# Patient Record
Sex: Female | Born: 1945 | Race: White | Hispanic: No | Marital: Married | Smoking: Former smoker
Health system: Southern US, Community
[De-identification: ages and names within clinical notes are randomized; demographics above are authoritative.]

## PROBLEM LIST (undated history)

## (undated) DIAGNOSIS — E119 Type 2 diabetes mellitus without complications: Secondary | ICD-10-CM

## (undated) DIAGNOSIS — D126 Benign neoplasm of colon, unspecified: Secondary | ICD-10-CM

## (undated) DIAGNOSIS — K5792 Diverticulitis of intestine, part unspecified, without perforation or abscess without bleeding: Secondary | ICD-10-CM

## (undated) DIAGNOSIS — J309 Allergic rhinitis, unspecified: Secondary | ICD-10-CM

## (undated) DIAGNOSIS — F5102 Adjustment insomnia: Secondary | ICD-10-CM

## (undated) DIAGNOSIS — E785 Hyperlipidemia, unspecified: Secondary | ICD-10-CM

## (undated) DIAGNOSIS — K219 Gastro-esophageal reflux disease without esophagitis: Secondary | ICD-10-CM

## (undated) DIAGNOSIS — I10 Essential (primary) hypertension: Secondary | ICD-10-CM

## (undated) DIAGNOSIS — M810 Age-related osteoporosis without current pathological fracture: Secondary | ICD-10-CM

## (undated) DIAGNOSIS — E039 Hypothyroidism, unspecified: Secondary | ICD-10-CM

## (undated) DIAGNOSIS — G4730 Sleep apnea, unspecified: Secondary | ICD-10-CM

## (undated) DIAGNOSIS — I700 Atherosclerosis of aorta: Secondary | ICD-10-CM

## (undated) DIAGNOSIS — T7840XA Allergy, unspecified, initial encounter: Secondary | ICD-10-CM

## (undated) DIAGNOSIS — E669 Obesity, unspecified: Secondary | ICD-10-CM

## (undated) HISTORY — DX: Sleep apnea, unspecified: G47.30

## (undated) HISTORY — PX: APPENDECTOMY: SHX54

## (undated) HISTORY — PX: TONSILLECTOMY: SUR1361

## (undated) HISTORY — PX: POLYPECTOMY: SHX149

## (undated) HISTORY — DX: Allergy, unspecified, initial encounter: T78.40XA

## (undated) HISTORY — DX: Age-related osteoporosis without current pathological fracture: M81.0

## (undated) HISTORY — DX: Adjustment insomnia: F51.02

## (undated) HISTORY — DX: Diverticulitis of intestine, part unspecified, without perforation or abscess without bleeding: K57.92

## (undated) HISTORY — DX: Obesity, unspecified: E66.9

## (undated) HISTORY — DX: Allergic rhinitis, unspecified: J30.9

## (undated) HISTORY — DX: Type 2 diabetes mellitus without complications: E11.9

## (undated) HISTORY — DX: Atherosclerosis of aorta: I70.0

## (undated) HISTORY — PX: COLONOSCOPY: SHX174

## (undated) HISTORY — PX: CHOLECYSTECTOMY: SHX55

## (undated) HISTORY — DX: Benign neoplasm of colon, unspecified: D12.6

## (undated) HISTORY — PX: ROBOTIC ASSISTED SALPINGO OOPHERECTOMY: SHX6082

## (undated) HISTORY — DX: Essential (primary) hypertension: I10

## (undated) HISTORY — PX: BUNIONECTOMY: SHX129

## (undated) HISTORY — DX: Hypothyroidism, unspecified: E03.9

## (undated) HISTORY — PX: PARTIAL HYSTERECTOMY: SHX80

## (undated) HISTORY — DX: Hyperlipidemia, unspecified: E78.5

## (undated) HISTORY — DX: Gastro-esophageal reflux disease without esophagitis: K21.9

---

## 1992-07-06 HISTORY — PX: CARDIAC CATHETERIZATION: SHX172

## 2001-05-03 ENCOUNTER — Other Ambulatory Visit: Admission: RE | Admit: 2001-05-03 | Discharge: 2001-05-03 | Payer: Self-pay | Admitting: Gastroenterology

## 2001-05-03 ENCOUNTER — Encounter (INDEPENDENT_AMBULATORY_CARE_PROVIDER_SITE_OTHER): Payer: Self-pay | Admitting: Specialist

## 2003-02-24 ENCOUNTER — Encounter: Payer: Self-pay | Admitting: Emergency Medicine

## 2003-02-24 ENCOUNTER — Observation Stay (HOSPITAL_COMMUNITY): Admission: EM | Admit: 2003-02-24 | Discharge: 2003-02-25 | Payer: Self-pay | Admitting: Emergency Medicine

## 2004-05-07 ENCOUNTER — Ambulatory Visit: Payer: Self-pay | Admitting: Gastroenterology

## 2004-09-03 ENCOUNTER — Ambulatory Visit: Payer: Self-pay | Admitting: Internal Medicine

## 2005-09-29 ENCOUNTER — Ambulatory Visit: Payer: Self-pay | Admitting: Internal Medicine

## 2007-04-27 ENCOUNTER — Ambulatory Visit: Payer: Self-pay | Admitting: Gastroenterology

## 2007-05-13 ENCOUNTER — Ambulatory Visit: Payer: Self-pay | Admitting: Gastroenterology

## 2007-05-13 ENCOUNTER — Encounter: Payer: Self-pay | Admitting: Gastroenterology

## 2007-09-14 ENCOUNTER — Emergency Department (HOSPITAL_COMMUNITY): Admission: EM | Admit: 2007-09-14 | Discharge: 2007-09-14 | Payer: Self-pay | Admitting: Emergency Medicine

## 2007-11-15 ENCOUNTER — Ambulatory Visit: Payer: Self-pay | Admitting: Internal Medicine

## 2008-07-06 ENCOUNTER — Emergency Department (HOSPITAL_COMMUNITY): Admission: EM | Admit: 2008-07-06 | Discharge: 2008-07-06 | Payer: Self-pay | Admitting: Emergency Medicine

## 2009-06-08 ENCOUNTER — Encounter: Admission: RE | Admit: 2009-06-08 | Discharge: 2009-06-08 | Payer: Self-pay | Admitting: Orthopedic Surgery

## 2010-03-20 ENCOUNTER — Encounter (INDEPENDENT_AMBULATORY_CARE_PROVIDER_SITE_OTHER): Payer: Self-pay | Admitting: *Deleted

## 2010-03-24 ENCOUNTER — Encounter (INDEPENDENT_AMBULATORY_CARE_PROVIDER_SITE_OTHER): Payer: Self-pay | Admitting: *Deleted

## 2010-04-21 ENCOUNTER — Ambulatory Visit: Payer: Self-pay

## 2010-05-02 ENCOUNTER — Encounter (INDEPENDENT_AMBULATORY_CARE_PROVIDER_SITE_OTHER): Payer: Self-pay | Admitting: *Deleted

## 2010-05-06 ENCOUNTER — Ambulatory Visit: Payer: Self-pay | Admitting: Gastroenterology

## 2010-05-20 ENCOUNTER — Ambulatory Visit: Payer: Self-pay | Admitting: Gastroenterology

## 2010-05-22 ENCOUNTER — Encounter: Payer: Self-pay | Admitting: Gastroenterology

## 2010-08-05 NOTE — Letter (Signed)
Summary: Pre Visit Letter Revised  [facility]  [address] [place], [place] [address]   Phone: [phone]  Fax: [phone]        03/24/2010 MRN: [id number] Toni [name] [address] [place], [place]  [address]             Procedure Date:  05/20/2010   Welcome to the Gastroenterology Division at [facility].    You are scheduled to see a nurse for your pre-procedure visit on 05/06/2010 at 8:00AM on the 3rd floor at [facility], [address]  We ask that you try to arrive at our office 15 minutes prior to your appointment time to allow for check-in.  Please take a minute to review the attached form.  If you answer "Yes" to one or more of the questions on the first page, we ask that you call the person listed at your earliest opportunity.  If you answer "No" to all of the questions, please complete the rest of the form and bring it to your appointment.    Your nurse visit will consist of discussing your medical and surgical history, your immediate family medical history, and your medications.   If you are unable to list all of your medications on the form, please bring the medication bottles to your appointment and we will list them.  We will need to be aware of both prescribed and over the counter drugs.  We will need to know exact dosage information as well.    Please be prepared to read and sign documents such as consent forms, a financial agreement, and acknowledgement forms.  If necessary, and with your consent, a friend or relative is welcome to sit-in on the nurse visit with you.  Please bring your insurance card so that we may make a copy of it.  If your insurance requires a referral to see a specialist, please bring your referral form from your primary care physician.  No co-pay is required for this nurse visit.     If you cannot keep your appointment, please call [phone] to cancel or reschedule prior to your appointment date.  This  allows [place] the opportunity to schedule an appointment for another patient in need of care.    Thank you for choosing [facility] for your medical needs.  We appreciate the opportunity to care for you.  Please visit [place] at our website  to learn more about our practice.  Sincerely, The Gastroenterology Division

## 2010-08-05 NOTE — Procedures (Signed)
Summary: Colonoscopy  Patient: Toni [name] Note: All result statuses are Final unless otherwise noted.  Tests: (1) Colonoscopy (COL)   COL Colonoscopy           DONE     [facility]     [address]     [place], [place]  [address]           COLONOSCOPY PROCEDURE REPORT           PATIENT:  Toni [name], Toni [name]  MR#:  [id number]     BIRTHDATE:  [birth year], 64 yrs. old  GENDER:  female     ENDOSCOPIST:  Judie Petit T. 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  Digital rectal exam was performed and     revealed no abnormalities.   The LB [id number] endoscope     was introduced through the anus and advanced to the cecum, which     was identified by both the appendix and ileocecal valve, without     limitations.  The quality of the prep was excellent, using     MoviPrep.  The instrument was then slowly withdrawn as the colon     was fully examined.     <<PROCEDUREIMAGES>>     FINDINGS:  Scattered diverticula were found in the proximal     transverse colon.  Mild diverticulosis was found in the sigmoid     colon.  A sessile polyp was found in the mid transverse colon. It     was 4 mm in size. Polyp was snared without cautery. Retrieval was     successful.  This was otherwise a normal examination of the colon.     Retroflexed views in the rectum revealed internal hemorrhoids,     small.  The time to cecum =  3  minutes. The scope was then     withdrawn (time =  8  min) from the patient and the  procedure     completed.     COMPLICATIONS:  None           ENDOSCOPIC IMPRESSION:     1) Diverticula, scattered in the proximal transverse colon     2) Mild diverticulosis in the sigmoid colon     3) 4 mm sessile polyp in the mid transverse colon     4) Internal hemorrhoids     RECOMMENDATIONS:     1) Await pathology results     2) High fiber diet with liberal fluid intake.     3) Repeat Colonoscopy in 3 years.           Venita Lick. Russella Dar, MD, [facility]           CC: Vickii Penna, MD           n.     Rosalie Doctor:  Venita Lick. Stark at 05/20/2010 10:18 AM           Allie Dimmer, [id number]  Note: An exclamation mark (!) indicates a result that was not dispersed into the flowsheet. Document Creation Date: 05/20/2010 10:19 AM _______________________________________________________________________  (1) Order result status: Final Collection or observation date-time: 05/20/2010 10:14 Requested date-time:  Receipt date-time:  Reported date-time:  Referring Physician:   Ordering Physician: Claudette Head [phone]) Specimen Source:  Source: Launa Grill Order Number: [id number] Lab site:   Appended Document: Colonoscopy     Procedures Next Due Date:    Colonoscopy: 05/2013

## 2010-08-05 NOTE — Miscellaneous (Signed)
Summary: [facility] Previsit/prep  Clinical Lists Changes  Medications: Added new medication of MOVIPREP 100 GM  SOLR (PEG-KCL-NACL-NASULF-NA ASC-C) As per prep instructions. - Signed Rx of MOVIPREP 100 GM  SOLR (PEG-KCL-NACL-NASULF-NA ASC-C) As per prep instructions.;  #1 x 0;  Signed;  Entered by: Wyona Almas RN;  Authorized by: Meryl Dare MD [facility];  Method used: [facility]  [place] [place], [place]  [address], Ph: [phone] or [phone], Fax: [phone] Allergies: Added new allergy or adverse reaction of PENICILLIN Added new allergy or adverse reaction of CECLOR Observations: Added new observation of NKA: F (05/06/2010 7:54)    Prescriptions: MOVIPREP 100 GM  SOLR (PEG-KCL-NACL-NASULF-NA ASC-C) As per prep instructions.  #1 x 0   Entered by:   Wyona Almas RN   Authorized by:   Meryl Dare MD [facility]   Signed by:   Wyona Almas RN on 05/06/2010   Method used:   [facility]ronically [facility]        [facility]  [place] (retail)       [address]       [place], [place]  [address]       Ph: [phone] or [phone]       Fax: [phone]   RxID:   [id number]

## 2010-08-05 NOTE — Letter (Signed)
Summary: Colonoscopy Letter  [facility]  [address] [place], [place] [address]   Phone: [phone]  Fax: [phone]      March 20, 2010 MRN: [id number]   Toni [name] [address] [place], [place]  [address]   Dear Ms. Hauck,   According to your medical record, it is time for you to schedule a Colonoscopy. The American Cancer Society recommends this procedure as a method to detect early colon cancer. Patients with a family history of colon cancer, or a personal history of colon polyps or inflammatory bowel disease are at increased risk.  This letter has beeen generated based on the recommendations made at the time of your procedure. If you feel that in your particular situation this may no longer apply, please contact our office.  Please call our office at [phone] to schedule this appointment or to update your records at your earliest convenience.  Thank you for cooperating with [place] to provide you with the very best care possible.   Sincerely,  Judie Petit T. Russella Dar, M.D.  [facility] Gastroenterology Division [phone]

## 2010-08-05 NOTE — Letter (Signed)
Summary: Patient Notice-Hyperplastic Polyps  [facility]  [address] [place], [place] [address]   Phone: [phone]  Fax: [phone]        May 22, 2010 MRN: [id number]    Toni [name] [address] [place], [place]  [address]    Dear [name],  I am pleased to inform you that the colon polyp(s) removed during your recent colonoscopy was (were) found to be hyperplastic. These types of polyps are NOT pre-cancerous.  It is my recommendation that you have a repeat colonoscopy examination in 3 years.  Should you develop new or worsening symptoms of abdominal pain, bowel habit changes or bleeding from the rectum or bowels, please schedule an evaluation with either your primary care physician or with me.  Continue treatment plan as outlined the day of your exam.  Please call us if you are having persistent problems or have questions about your condition that have not been fully answered at this time.  Sincerely,  Meryl Dare MD [facility]  This letter has been electronically signed by your physician.  Appended Document: Patient Notice-Hyperplastic Polyps Letter mailed

## 2010-08-05 NOTE — Letter (Signed)
Summary: Moviprep Instructions  [facility]  [address]   [place], [place] [address]   Phone: [phone]  Fax: [phone]       KARN DERK    [birth year]    MRN: [id number]        Procedure Day Dorna Bloom: Tuesday, 05-20-10     Arrival Time: 9:00 a.m.     Procedure Time: 10:00 a.m.     Location of Procedure:                     x   Endoscopy Center (4th Floor)   PREPARATION FOR COLONOSCOPY WITH MOVIPREP   Starting 5 days prior to your procedure 05-15-10 do not eat nuts, seeds, popcorn, corn, beans, peas,  salads, or any raw vegetables.  Do not take any fiber supplements (e.g. Metamucil, Citrucel, and Benefiber).  THE DAY BEFORE YOUR PROCEDURE         DATE: 05-19-10  DAY: Monday  1.  Drink clear liquids the entire day-NO SOLID FOOD  2.  Do not drink anything colored red or purple.  Avoid juices with pulp.  No orange juice.  3.  Drink at least 64 oz. (8 glasses) of fluid/clear liquids during the day to prevent dehydration and help the prep work efficiently.  CLEAR LIQUIDS INCLUDE: Water Jello Ice Popsicles Tea (sugar ok, no milk/cream) Powdered fruit flavored drinks Coffee (sugar ok, no milk/cream) Gatorade Juice: apple, white grape, white cranberry  Lemonade Clear bullion, consomm, broth Carbonated beverages (any kind) Strained chicken noodle soup Hard Candy                             4.  In the morning, mix first dose of MoviPrep solution:    Empty 1 Pouch A and 1 Pouch B into the disposable container    Add lukewarm drinking water to the top line of the container. Mix to dissolve    Refrigerate (mixed solution should be used within 24 hrs)  5.  Begin drinking the prep at 5:00 p.m. The MoviPrep container is divided by 4 marks.   Every 15 minutes drink the solution down to the next mark (approximately 8 oz) until the full liter is complete.   6.  Follow completed prep with 16 oz of clear liquid of your choice (Nothing red or  purple).  Continue to drink clear liquids until bedtime.  7.  Before going to bed, mix second dose of MoviPrep solution:    Empty 1 Pouch A and 1 Pouch B into the disposable container    Add lukewarm drinking water to the top line of the container. Mix to dissolve    Refrigerate  THE DAY OF YOUR PROCEDURE      DATE: 05-20-10  DAY: Tuesday  Beginning at 5:00 a.m. (5 hours before procedure):         1. Every 15 minutes, drink the solution down to the next mark (approx 8 oz) until the full liter is complete.  2. Follow completed prep with 16 oz. of clear liquid of your choice.    3. You may drink clear liquids until  8:00 a.m. (2 HOURS BEFORE PROCEDURE).   MEDICATION INSTRUCTIONS  Unless otherwise instructed, you should take regular prescription medications with a small sip of water   as early as possible the morning of your procedure.  Additional medication instructions:  Hold Diovan/HCTZ the morning of procedure.  OTHER INSTRUCTIONS  You will need a responsible adult at least 63 years of age to accompany you and drive you home.   This person must remain in the waiting room during your procedure.  Wear loose fitting clothing that is easily removed.  Leave jewelry and other valuables at home.  However, you may wish to bring a book to read or  an iPod/MP3 player to listen to music as you wait for your procedure to start.  Remove all body piercing jewelry and leave at home.  Total time from sign-in until discharge is approximately 2-3 hours.  You should go home directly after your procedure and rest.  You can resume normal activities the  day after your procedure.  The day of your procedure you should not:   Drive   Make legal decisions   Operate machinery   Drink alcohol   Return to work  You will receive specific instructions about eating, activities and medications before you leave.    The above instructions have been reviewed and explained to me  by  Wyona Almas RN  May 06, 2010 8:22 AM     I fully understand and can verbalize these instructions _____________________________ Date _________

## 2010-11-21 NOTE — H&P (Signed)
NAME:  DELENN, AHN                        ACCOUNT NO.:  [id number]   MEDICAL RECORD NO.:  [id number]                   PATIENT TYPE:  INP   LOCATION:  1825                                 FACILITY:  [facility]   PHYSICIAN:  Vida Roller, M.D.                DATE OF BIRTH:  09-Jan-1946   DATE OF ADMISSION:  02/24/2003  DATE OF DISCHARGE:                                HISTORY & PHYSICAL   PRIMARY CARE PHYSICIAN:  Dr. Cedric Fishman in [place].   CARDIOLOGIST:  Willa Rough, M.D.   HISTORY OF PRESENT ILLNESS:  The patient is a 65 year old white female with  a past medical history significant for noncardiac chest pain status post  heart catheterization 10 years ago who presents with an episode of  discomfort underneath her left breast.  She was asleep, and it awoke her at  4 o'clock this morning associated with profound diaphoresis with radiation  to her neck.  No shortness of breath.  Mild to moderate palpitations.  She  presented to the emergency department and received some sublingual  nitroglycerin with resolution of the discomfort. Prior to her actually  reporting to the emergency department, she was given four baby aspirins to  chew, and since that time, she has had no discomfort in her chest.  Her  electrocardiogram and initial enzymes appear to be inconsistent with acute  myocardial infarction.  She had an episode of chest discomfort a number of  years ago which was found to be gastroesophageal reflux disease.  She  underwent heart catheterization here at [facility], and was found  to have nonobstructive coronary disease.   PAST MEDICAL HISTORY:  Significant for gastroesophageal reflux disease.  She  takes Prevacid without any difficulty with her discomfort now.  She also has  hypertension which was recently diagnosed by her primary care physician who  started her on Diovan without significant decrement in her blood pressure.  She then had Diovan HCT added with  significant improvement, and she is now  normotensive on medications.  She has hyperlipidemia.  She is treated with  Zetia.  The numbers are not available to me, but she tells me she is  controlled with that medications.  She has had a hysterectomy, gallbladder  surgery with removal, appendectomy, tonsillectomy.  She has orthopedic  problems with her foot.  She has recently had an infection in her foot for  some plantar fasciitis or a stress fracture.  She is not certain.   ALLERGIES:  PENICILLIN.  CECLOR.  She gets a rash.   SOCIAL HISTORY:  She works as a Hydrologist.  She has two children,  both of whom are healthy, a boy and a girl, ages 101 and 86.  She has three  grandchildren who are all healthy.  Her mother is still alive and has  hypertension at age 13.  Her father died a number of years  ago of throat  cancer.  She does not smoke, but has a past smoking history greater than 25  pack years.  She does not drink alcohol.  She does not use drugs.   REVIEW OF SYSTEMS:  Essentially noncontributory except for that mentioned in  the history of present illness.   PHYSICAL EXAMINATION:  GENERAL:  She is a well-developed, well-nourished  white female, in no apparent distress who is alert and oriented x4.  VITAL SIGNS:  Blood pressure 140/70, heart rate 60 and regular, respirations  are 14 and she is afebrile.  HEENT:  Examination of the head, ears, eyes, nose and throat is  unremarkable.  NECK:  Supple.  There is no jugular venous distension or carotid bruits.  CHEST:  Clear to auscultation.  CARDIAC:  Exam with a nondisplaced point of maximum impulse with no lifts or  thrills.  The 1st and 2nd heart sounds are normal.  There is no 3rd or 4th  heart sound.  No murmurs are noted.  ABDOMEN:  Soft, nontender, normoactive bowel sounds.  EXTREMITIES:  Lower extremities are without significant clubbing, cyanosis  or edema.  Her lower extremities have 2+ pulses throughout.    LABORATORY DATA:  Electrocardiogram reveals sinus rhythm with normal  intervals, normal axis, at a rate of 60, with no ST-T wave changes  concerning for ischemia.   Glucose 173, BUN 13, sodium 137, potassium 3.5, chloride 103, bicarbonate of  28.  Her hemoglobin is 16, hematocrit 46.  Her creatinine is 1.0.  Initial  set of cardiac enzymes show CK-MB of 8.0, and myoglobin of 200, troponin of  0.05.   ASSESSMENT:  This is a lady with atypical chest pain who had a  catheterization distantly which was normal who has negative enzymes and a  normal electrocardiogram, who has hypertension which is new onset,  hyperlipidemia.  She is a former tobacco smoker.   PLAN:  1. Cycle her enzymes.  2. Get an exercise Cardiolite in the morning.  3. We will add aspirin to her medication regime.  4. Follow her up at that point.                                                Vida Roller, M.D.    JH/MEDQ  D:  02/24/2003  T:  02/25/2003  Job:  [id number]

## 2010-11-21 NOTE — Discharge Summary (Signed)
   NAME:  Toni [name], Toni [name]                        ACCOUNT NO.:  [id number]   MEDICAL RECORD NO.:  [id number]                   PATIENT TYPE:  INP   LOCATION:  3711                                 FACILITY:  [facility]   PHYSICIAN:  Vida Roller, M.D.                DATE OF BIRTH:  1946/05/12   DATE OF ADMISSION:  02/24/2003  DATE OF DISCHARGE:                           DISCHARGE SUMMARY - REFERRING   PROCEDURE:  Adenosine Cardiolite on February 25, 2003   REASON FOR CONSULTATION:  Please refer to dictated admission note.   LABORATORY DATA:  Cardiac enzymes:  CPK, MB and Troponin markers were  normal.  CBC was normal.  Complete metabolic profile was normal.   Admission chest x-ray:  No acute disease.   HOSPITAL COURSE:  The patient was admitted for further evaluation of chest  discomfort felt to be atypical for ischemic heart disease in the setting of  reportedly normal coronary angiogram, treated hypertension, dyslipidemia and  a history of tobacco.  Serial cardiac enzymes were normal.   The patient was thus referred for stress Cardiolite testing, employing  Adenosine.  No significant ST abnormalities were noted and follow-up  perfusion imaging revealed no perfusion defects; ejection fraction 74%.   The patient was discharged with instructions to resume previous home  medication regimen including Prevacid for treatment of GERD.   Of note, the patient has undergone evaluation by Dr. Claudette Head in the  past and recommendations were to follow-up with him for further evaluation  of GI-related chest pain.   DISCHARGE MEDICATIONS:  1. Aspirin 81 mg daily.  2. Synthroid 0.112 mg daily.  3. Prevacid 30 mg daily.  4. Zetia 10 mg daily.  5. Diovan 80 mg daily.  6. Hydrochlorothiazide 12.5 mg daily.   FOLLOW UP:  1. Follow-up with Dr. Tora Kindred in the following one to two weeks.  2. The patient is also instructed to arrange follow-up with Dr. Claudette Head within the next few weeks  as well.  3. The patient will follow-up with Dr. Willa Rough on an as needed basis.   DISCHARGE DIAGNOSES:  1. Non-cardiac chest pain;     a. Normal serial cardiac markers.     b. Normal Adenosine Cardiolite on August 22.     c. Remote normal coronary angiogram.  2. Hypertension.  3. Dyslipidemia.  4. Gastroesophageal reflux disease.  5. History of tobacco.      Gene Serpe, P.A. [facility]                      Vida Roller, M.D.    GS/MEDQ  D:  02/25/2003  T:  02/25/2003  Job:  [id number]   cc:   Dr. Earley Brooke, [place]   Venita Lick. Russella Dar, M.D. [facility]

## 2011-01-06 ENCOUNTER — Ambulatory Visit: Payer: Self-pay | Admitting: Unknown Physician Specialty

## 2011-09-02 ENCOUNTER — Ambulatory Visit: Payer: Self-pay | Admitting: Anesthesiology

## 2011-09-02 DIAGNOSIS — I10 Essential (primary) hypertension: Secondary | ICD-10-CM

## 2011-09-02 LAB — BASIC METABOLIC PANEL
BUN: 22 mg/dL — ABNORMAL HIGH (ref 7–18)
Chloride: 106 mmol/L (ref 98–107)
EGFR (African American): >60
Osmolality: 293 (ref 275–301)
Potassium: 3.7 mmol/L (ref 3.5–5.1)
Sodium: 145 mmol/L (ref 136–145)

## 2011-09-02 LAB — HEMOGLOBIN: HGB: 14 g/dL (ref 12.0–16.0)

## 2011-09-04 HISTORY — PX: TOTAL THYROIDECTOMY: SHX2547

## 2011-09-10 ENCOUNTER — Ambulatory Visit: Payer: Self-pay | Admitting: Otolaryngology

## 2011-09-11 LAB — CALCIUM: Calcium, Total: 8.9 mg/dL (ref 8.5–10.1)

## 2011-09-14 LAB — PATHOLOGY REPORT

## 2012-04-14 ENCOUNTER — Ambulatory Visit: Payer: Self-pay | Admitting: Unknown Physician Specialty

## 2012-12-04 HISTORY — PX: HAMMER TOE SURGERY: SHX385

## 2012-12-28 ENCOUNTER — Ambulatory Visit: Payer: Self-pay | Admitting: Podiatry

## 2013-02-24 ENCOUNTER — Encounter: Payer: Self-pay | Admitting: Gastroenterology

## 2013-03-15 ENCOUNTER — Encounter: Payer: Self-pay | Admitting: Gastroenterology

## 2013-05-06 DIAGNOSIS — D126 Benign neoplasm of colon, unspecified: Secondary | ICD-10-CM

## 2013-05-06 HISTORY — DX: Benign neoplasm of colon, unspecified: D12.6

## 2013-05-09 ENCOUNTER — Ambulatory Visit (AMBULATORY_SURGERY_CENTER): Payer: Medicare Other

## 2013-05-09 VITALS — Ht 66.0 in | Wt 160.4 lb

## 2013-05-09 DIAGNOSIS — Z86010 Personal history of colonic polyps: Secondary | ICD-10-CM

## 2013-05-09 DIAGNOSIS — Z800 Family history of malignant neoplasm of digestive organs: Secondary | ICD-10-CM

## 2013-05-09 MED ORDER — MOVIPREP 100 G PO SOLR: ORAL | Status: DC

## 2013-05-12 ENCOUNTER — Encounter: Payer: Self-pay | Admitting: Gastroenterology

## 2013-05-24 ENCOUNTER — Encounter: Payer: Self-pay | Admitting: Gastroenterology

## 2013-05-24 ENCOUNTER — Ambulatory Visit (AMBULATORY_SURGERY_CENTER): Payer: Medicare Other | Admitting: Gastroenterology

## 2013-05-24 VITALS — BP 139/71 | HR 50 | Temp 97.9°F | Resp 16 | Ht 66.0 in | Wt 160.0 lb

## 2013-05-24 DIAGNOSIS — D126 Benign neoplasm of colon, unspecified: Secondary | ICD-10-CM

## 2013-05-24 DIAGNOSIS — Z800 Family history of malignant neoplasm of digestive organs: Secondary | ICD-10-CM

## 2013-05-24 DIAGNOSIS — Z86010 Personal history of colonic polyps: Secondary | ICD-10-CM

## 2013-05-24 MED ORDER — SODIUM CHLORIDE 0.9 % IV SOLN: 500.0000 mL | INTRAVENOUS | Status: DC

## 2013-05-24 NOTE — Patient Instructions (Signed)

## 2013-05-24 NOTE — Op Note (Signed)
[facility] [address] [place], [address]   COLONOSCOPY PROCEDURE REPORT PATIENT: Toni [name], Toni [name]  MR#: [id number] BIRTHDATE: [birth year] , 67  yrs. old GENDER: Female ENDOSCOPIST: Meryl Dare, MD, [facility] PROCEDURE DATE:  05/24/2013 PROCEDURE:   Colonoscopy with biopsy and snare polypectomy First Screening Colonoscopy - Avg.  risk and is 50 yrs.  old or older - No.  Prior Negative Screening - Now for repeat screening. N/A  History of Adenoma - Now for follow-up colonoscopy & has been > or = to 3 yrs.  Yes hx of adenoma.  Has been 3 or more years since last colonoscopy.  Polyps Removed Today? Yes. ASA CLASS:   Class II INDICATIONS:Patient's personal history of adenomatous colon polyps and Patient's family history of colon cancer, distant relatives. MEDICATIONS: MAC sedation, administered by CRNA and propofol (Diprivan) 250mg  IV DESCRIPTION OF PROCEDURE:   After the risks benefits and alternatives of the procedure were thoroughly explained, informed consent was obtained.  A digital rectal exam revealed no abnormalities of the rectum.   The LB [id number]  endoscope was introduced through the anus and advanced to the cecum, which was identified by both the appendix and ileocecal valve. No adverse events experienced.   The quality of the prep was good, using MoviPrep  The instrument was then slowly withdrawn as the colon was fully examined.  COLON FINDINGS: Two sessile polyps measuring 3 mm in size were found in the ascending colon.  A polypectomy was performed with cold forceps.  The resection was complete and the polyp tissue was completely retrieved.   Two sessile polyps ranging between 3-27mm in size were found in the transverse colon.  A polypectomy was performed with cold forceps and with a cold snare.  The resection was complete and the polyp tissue was completely retrieved.   A sessile polyp measuring 5 mm in size was found in the sigmoid colon.  A  polypectomy was performed with a cold snare.  The resection was complete and the polyp tissue was completely retrieved.   Mild diverticulosis was noted in the transverse colon and sigmoid colon.   The colon was otherwise normal.  There was no diverticulosis, inflammation, polyps or cancers unless previously stated.  Retroflexed views revealed internal hemorrhoids. The time to cecum=3 minutes 34 seconds.  Withdrawal time=11 minutes 44 seconds.  The scope was withdrawn and the procedure completed. COMPLICATIONS: There were no complications. ENDOSCOPIC IMPRESSION: 1.   Two sessile polyps measuring 3 mm in the ascending colon; polypectomy performed with cold forceps 2.   Two sessile polyps, 3-16mm, in the transverse colon; polypectomy performed with cold forceps and with a cold snare 3.   Sessile polyp measuring 5 mm in the sigmoid colon; polypectomy was performed with a cold snare 4.   Mild diverticulosis was noted in the transverse colon and sigmoid colon 5.   Small internal hemorrhoids RECOMMENDATIONS: 1.  Await pathology results 2.  High fiber diet with liberal fluid intake. 3.  Repeat Colonoscopy in 3 years. eSigned:  Meryl Dare, MD, [facility] 05/24/2013 2:07 PM  cc: Beverely Risen, MD

## 2013-05-24 NOTE — Progress Notes (Signed)
Called to room to assist during endoscopic procedure.  Patient ID and intended procedure confirmed with present staff. Received instructions for my participation in the procedure from the performing physician.  

## 2013-05-24 NOTE — Progress Notes (Signed)
Patient did not experience any of the following events: a burn prior to discharge; a fall within the facility; wrong site/side/patient/procedure/implant event; or a hospital transfer or hospital admission upon discharge from the facility. (G8907) Patient did not have preoperative order for IV antibiotic SSI prophylaxis. (G8918)  

## 2013-05-25 ENCOUNTER — Telehealth: Payer: Self-pay | Admitting: *Deleted

## 2013-05-25 NOTE — Telephone Encounter (Signed)
  Follow up Call-  Call back number 05/24/2013  Post procedure Call Back phone  # [phone]  Permission to leave phone message Yes     Patient questions:  Do you have a fever, pain , or abdominal swelling? no Pain Score  0 *  Have you tolerated food without any problems? yes  Have you been able to return to your normal activities? yes  Do you have any questions about your discharge instructions: Diet   no Medications  no Follow up visit  no  Do you have questions or concerns about your Care? no  Actions: * If pain score is 4 or above: No action needed, pain <4.

## 2013-05-29 ENCOUNTER — Encounter: Payer: Self-pay | Admitting: Gastroenterology

## 2014-03-02 ENCOUNTER — Encounter: Payer: Self-pay | Admitting: Gastroenterology

## 2014-08-03 ENCOUNTER — Ambulatory Visit: Payer: Self-pay

## 2014-09-25 ENCOUNTER — Encounter: Payer: Self-pay | Admitting: Gastroenterology

## 2014-09-26 ENCOUNTER — Ambulatory Visit: Payer: Self-pay | Admitting: Gastroenterology

## 2014-10-14 ENCOUNTER — Emergency Department (HOSPITAL_COMMUNITY)
Admission: EM | Admit: 2014-10-14 | Discharge: 2014-10-14 | Disposition: A | Discharge status: Home / Self Care | Payer: PPO | Attending: Emergency Medicine | Admitting: Emergency Medicine

## 2014-10-14 ENCOUNTER — Encounter (HOSPITAL_COMMUNITY): Payer: Self-pay | Admitting: *Deleted

## 2014-10-14 ENCOUNTER — Emergency Department (HOSPITAL_COMMUNITY): Payer: PPO

## 2014-10-14 DIAGNOSIS — Y9289 Other specified places as the place of occurrence of the external cause: Secondary | ICD-10-CM | POA: Diagnosis not present

## 2014-10-14 DIAGNOSIS — Z7982 Long term (current) use of aspirin: Secondary | ICD-10-CM | POA: Insufficient documentation

## 2014-10-14 DIAGNOSIS — E785 Hyperlipidemia, unspecified: Secondary | ICD-10-CM | POA: Insufficient documentation

## 2014-10-14 DIAGNOSIS — I10 Essential (primary) hypertension: Secondary | ICD-10-CM | POA: Diagnosis not present

## 2014-10-14 DIAGNOSIS — Y998 Other external cause status: Secondary | ICD-10-CM | POA: Diagnosis not present

## 2014-10-14 DIAGNOSIS — S0031XA Abrasion of nose, initial encounter: Secondary | ICD-10-CM | POA: Diagnosis not present

## 2014-10-14 DIAGNOSIS — Y9389 Activity, other specified: Secondary | ICD-10-CM | POA: Diagnosis not present

## 2014-10-14 DIAGNOSIS — Z880 Allergy status to penicillin: Secondary | ICD-10-CM | POA: Insufficient documentation

## 2014-10-14 DIAGNOSIS — W01198A Fall on same level from slipping, tripping and stumbling with subsequent striking against other object, initial encounter: Secondary | ICD-10-CM | POA: Insufficient documentation

## 2014-10-14 DIAGNOSIS — E079 Disorder of thyroid, unspecified: Secondary | ICD-10-CM | POA: Insufficient documentation

## 2014-10-14 DIAGNOSIS — K219 Gastro-esophageal reflux disease without esophagitis: Secondary | ICD-10-CM | POA: Diagnosis not present

## 2014-10-14 DIAGNOSIS — S60222A Contusion of left hand, initial encounter: Secondary | ICD-10-CM | POA: Diagnosis not present

## 2014-10-14 DIAGNOSIS — Z79899 Other long term (current) drug therapy: Secondary | ICD-10-CM | POA: Diagnosis not present

## 2014-10-14 DIAGNOSIS — S0990XA Unspecified injury of head, initial encounter: Secondary | ICD-10-CM | POA: Diagnosis present

## 2014-10-14 DIAGNOSIS — W19XXXA Unspecified fall, initial encounter: Secondary | ICD-10-CM

## 2014-10-14 DIAGNOSIS — T148XXA Other injury of unspecified body region, initial encounter: Secondary | ICD-10-CM

## 2014-10-14 LAB — BASIC METABOLIC PANEL
Anion gap: 9 (ref 5–15)
BUN: 13 mg/dL (ref 6–23)
CALCIUM: 9 mg/dL (ref 8.4–10.5)
CO2: 26 mmol/L (ref 19–32)
CREATININE: 1.08 mg/dL (ref 0.50–1.10)
Chloride: 104 mmol/L (ref 96–112)
GFR calc Af Amer: 61 mL/min — ABNORMAL LOW (ref >90)
GFR calc non Af Amer: 52 mL/min — ABNORMAL LOW (ref >90)
Glucose, Bld: 158 mg/dL — ABNORMAL HIGH (ref 70–99)
Potassium: 3.6 mmol/L (ref 3.5–5.1)
Sodium: 139 mmol/L (ref 135–145)

## 2014-10-14 LAB — CBC
HEMATOCRIT: 31.6 % — AB (ref 36.0–46.0)
HEMOGLOBIN: 9.9 g/dL — AB (ref 12.0–15.0)
MCH: 25.1 pg — AB (ref 26.0–34.0)
MCHC: 31.3 g/dL (ref 30.0–36.0)
MCV: 80 fL (ref 78.0–100.0)
Platelets: 197 10*3/uL (ref 150–400)
RBC: 3.95 MIL/uL (ref 3.87–5.11)
RDW: 13.6 % (ref 11.5–15.5)
WBC: 5.4 10*3/uL (ref 4.0–10.5)

## 2014-10-14 LAB — I-STAT TROPONIN, ED: Troponin i, poc: 0 ng/mL (ref 0.00–0.08)

## 2014-10-14 MED ORDER — ACETAMINOPHEN 325 MG PO TABS
650.0000 mg | ORAL_TABLET | Freq: Once | ORAL | Status: AC
  Administered 2014-10-14: 650 mg via ORAL
  Filled 2014-10-14: qty 2

## 2014-10-14 MED ORDER — OXYCODONE-ACETAMINOPHEN 5-325 MG PO TABS: 1.0000 | ORAL_TABLET | Freq: Four times a day (QID) | ORAL | Status: DC | PRN

## 2014-10-14 MED ORDER — BACITRACIN ZINC 500 UNIT/GM EX OINT
1.0000 "application " | TOPICAL_OINTMENT | CUTANEOUS | Status: AC
  Administered 2014-10-14: 1 via TOPICAL

## 2014-10-14 MED ORDER — SODIUM CHLORIDE 0.9 % IV BOLUS (SEPSIS)
1000.0000 mL | INTRAVENOUS | Status: AC
  Administered 2014-10-14: 1000 mL via INTRAVENOUS

## 2014-10-14 NOTE — ED Notes (Signed)
Pt reports getting out of car today and felt dizzy, pt fell but denies loc. Hit her head on cement, has small laceration to nose, abrasion to left hand and pain to right hand.

## 2014-10-14 NOTE — ED Provider Notes (Signed)
CSN: [id number]     Arrival date & time 10/14/14  1414 History   First MD Initiated Contact with Patient 10/14/14 1543     Chief Complaint  Patient presents with  . Fall  . Dizziness     (Consider location/radiation/quality/duration/timing/severity/associated sxs/prior Treatment) Patient is a 69 y.o. female presenting with fall and dizziness. The history is provided by the patient.  Fall This is a new problem. The current episode started 1 to 2 hours ago. Episode frequency: once. The problem has been resolved. Associated symptoms include headaches. Pertinent negatives include no chest pain, no abdominal pain and no shortness of breath. The symptoms are aggravated by standing. The symptoms are relieved by rest. She has tried nothing for the symptoms. The treatment provided moderate relief.  Dizziness Quality:  Lightheadedness Severity:  Moderate Onset quality:  Sudden Duration:  3 hours Timing:  Intermittent Progression:  Partially resolved Chronicity:  New Context: standing up   Relieved by: sitting. Worsened by:  Standing up Ineffective treatments:  None tried Associated symptoms: headaches   Associated symptoms: no chest pain, no diarrhea, no nausea, no shortness of breath and no vomiting     Past Medical History  Diagnosis Date  . Hypertension   . Hyperlipidemia   . Thyroid disease   . GERD (gastroesophageal reflux disease)    Past Surgical History  Procedure Laterality Date  . Appendectomy    . Total thyroidectomy  09/2011  . Partial hysterectomy    . Robotic assisted salpingo oopherectomy      right ovary first removed then left ovary removed years later  . Bunionectomy      right foot  . Hammer toe surgery  12/2012    left toe  . Cholecystectomy     Family History  Problem Relation Age of Onset  . Throat cancer Father   . Colon cancer Maternal Aunt   . Colon cancer Maternal Uncle    History  Substance Use Topics  . Smoking status: Never Smoker   .  Smokeless tobacco: Never Used  . Alcohol Use: No   OB History    No data available     Review of Systems  Constitutional: Negative for fever and fatigue.  HENT: Negative for congestion and drooling.   Eyes: Negative for pain.  Respiratory: Negative for cough and shortness of breath.   Cardiovascular: Negative for chest pain.  Gastrointestinal: Negative for nausea, vomiting, abdominal pain and diarrhea.  Genitourinary: Negative for dysuria and hematuria.  Musculoskeletal: Negative for back pain, gait problem and neck pain.  Skin: Negative for color change.  Neurological: Positive for dizziness and headaches.  Hematological: Negative for adenopathy.  Psychiatric/Behavioral: Negative for behavioral problems.  All other systems reviewed and are negative.     Allergies  Cefaclor and Penicillins  Home Medications   Prior to Admission medications   Medication Sig Start Date End Date Taking? Authorizing Provider  aspirin 81 MG tablet Take 81 mg by mouth daily.    Historical Provider, MD  Calcium Carbonate-Vitamin D (CALCIUM + D PO) Take by mouth daily.    Historical Provider, MD  fenofibrate 54 MG tablet Take 54 mg by mouth daily.    Historical Provider, MD  levothyroxine (SYNTHROID, LEVOTHROID) 112 MCG tablet Take 112 mcg by mouth. Take one daily and 1/2 on Sunday    Historical Provider, MD  pantoprazole (PROTONIX) 40 MG tablet Take 40 mg by mouth daily.    Historical Provider, MD  rosuvastatin (CRESTOR) 10 MG tablet  Take 10 mg by mouth. Take 1/2 daily    Historical Provider, MD  valsartan-hydrochlorothiazide (DIOVAN-HCT) 160-12.5 MG per tablet Take 1 tablet by mouth daily.    Historical Provider, MD  Zoledronic Acid (RECLAST IV) Inject into the vein. Take annually- if density test changes    Historical Provider, MD   BP 118/63 mmHg  Pulse 79  Temp(Src) 98.1 F (36.7 C) (Oral)  Resp 18  Ht 5' 6.5" (1.689 m)  Wt 184 lb 12.8 oz (83.825 kg)  BMI 29.38 kg/m2  SpO2 100% Physical  Exam  Constitutional: She is oriented to person, place, and time. She appears well-developed and well-nourished.  HENT:  Head: Normocephalic.  Mouth/Throat: Oropharynx is clear and moist. No oropharyngeal exudate.  Mild abrasion to the nasal bridge.  Eyes: Conjunctivae and EOM are normal. Pupils are equal, round, and reactive to light.  Neck: Normal range of motion. Neck supple.  No focal vertebral tenderness.  Cardiovascular: Normal rate, regular rhythm, normal heart sounds and intact distal pulses.  Exam reveals no gallop and no friction rub.   No murmur heard. Pulmonary/Chest: Effort normal and breath sounds normal. No respiratory distress. She has no wheezes.  Abdominal: Soft. Bowel sounds are normal. There is no tenderness. There is no rebound and no guarding.  Musculoskeletal: Normal range of motion. She exhibits no edema or tenderness.  Symmetric lower extremities without focal tenderness.  Mild abrasion to the base of the palm on the left hand.  Mild bruising and swelling with mild tenderness to the lateral ulnar aspect of the right hand.  Neurological: She is alert and oriented to person, place, and time.  alert, oriented x3 speech: normal in context and clarity memory: intact grossly cranial nerves II-XII: intact motor strength: full proximally and distally no involuntary movements or tremors sensation: intact to light touch diffusely  cerebellar: finger-to-nose and heel-to-shin intact gait: normal forwards and backwards, mild lightheadedness w/ standing  Skin: Skin is warm and dry.  Psychiatric: She has a normal mood and affect. Her behavior is normal.  Nursing note and vitals reviewed.   ED Course  Procedures (including critical care time) Labs Review Labs Reviewed  CBC - Abnormal; Notable for the following:    Hemoglobin 9.9 (*)    HCT 31.6 (*)    MCH 25.1 (*)    All other components within normal limits  BASIC METABOLIC PANEL - Abnormal; Notable for the  following:    Glucose, Bld 158 (*)    GFR calc non Af Amer 52 (*)    GFR calc Af Amer 61 (*)    All other components within normal limits  Randolm Idol, ED    Imaging Review Dg Chest 2 View  10/14/2014   CLINICAL DATA:  FALL DIZZINESS  EXAM: CHEST  2 VIEW  COMPARISON:  None.  FINDINGS: The heart size and mediastinal contours are within normal limits. Both lungs are clear. Mild spondylosis identified within the thoracic spine.  IMPRESSION: No active cardiopulmonary disease.   Electronically Signed   By: Kerby Moors M.D.   On: 10/14/2014 15:46   Dg Hand Complete Right  10/14/2014   CLINICAL DATA:  Hand injury today.  EXAM: RIGHT HAND - COMPLETE 3+ VIEW  COMPARISON:  None.  FINDINGS: There is no evidence of fracture or dislocation. There is no evidence of arthropathy or other focal bone abnormality. Soft tissues are unremarkable.  IMPRESSION: Negative.   Electronically Signed   By: Marybelle Killings M.D.   On: 10/14/2014 15:41  EKG Interpretation   Date/Time:  Sunday October 14 2014 14:31:06 EDT Ventricular Rate:  79 PR Interval:  154 QRS Duration: 84 QT Interval:  390 QTC Calculation: 447 R Axis:   61 Text Interpretation:  Normal sinus rhythm Nonspecific ST abnormality  Otherwise no significant change Confirmed by Roben Schliep  MD, [place] ([id number])  on 10/14/2014 3:52:56 PM      MDM   Final diagnoses:  Fall  Abrasion    4:16 PM 69 y.o. female w hx of HTN, HLP who presents with dizziness and fall which occurred around 1 PM today. She states that she had gone to lunch and was getting out of her previous when she began feeling lightheaded and fell to the ground. She denies loss of consciousness. She did hit her head. She is noted some mild dizziness with standing since that time. She denies any chest pain or shortness of breath. She states that she is otherwise been well. Vital signs unremarkable here. She has a normal neurologic exam with very mild dizziness upon standing.  Will get CT  of head, IV fluids, and Tylenol. Tdap is up-to-date within the last 3 years per the patient. 5/10 frontal HA currently.   Hemoglobin found to be 9.9. Patient notes that she had diverticulitis several weeks ago. She also gave blood last week. She states that she has hemorrhoids and occasionally sees some mild bright red blood in her stool, last seen was about a week ago. I do not have a baseline hemoglobin for her.  7:43 PM: I interpreted/reviewed the labs and/or imaging which were non-contributory.  Pt remains ambulatory. Cont to appear well.  I have discussed the diagnosis/risks/treatment options with the patient and family and believe the pt to be eligible for discharge home to follow-up with her pcp. We also discussed returning to the ED immediately if new or worsening sx occur. We discussed the sx which are most concerning (e.g., worsening dizziness, worsening HA, fever) that necessitate immediate return. Medications administered to the patient during their visit and any new prescriptions provided to the patient are listed below.  Medications given during this visit Medications  sodium chloride 0.9 % bolus 1,000 mL (0 mLs Intravenous Stopped 10/14/14 1804)  acetaminophen (TYLENOL) tablet 650 mg (650 mg Oral Given 10/14/14 1634)  bacitracin ointment 1 application (1 application Topical Given 10/14/14 1920)    New Prescriptions   OXYCODONE-ACETAMINOPHEN (PERCOCET) 5-325 MG PER TABLET    Take 1 tablet by mouth every 6 (six) hours as needed for moderate pain.     Pamella Pert, MD 10/14/14 1943

## 2014-10-28 NOTE — Op Note (Signed)
PATIENT NAME:  Toni [name], Toni [name] MR#:  [id number] DATE OF BIRTH:  02/22/1946  DATE OF PROCEDURE:  09/10/2011  PREOPERATIVE DIAGNOSIS: Calcified thyroid nodules bilaterally.   POSTOPERATIVE DIAGNOSIS: Calcified thyroid nodules bilaterally.  PROCEDURE: Total thyroidectomy.   SURGEON: Janalee Dane, MD   ASSISTANT: Mahlon Gammon, MD    DESCRIPTION OF PROCEDURE: The patient was placed in the supine position on the operating room table. After general endotracheal anesthesia had been induced, the patient was placed on a shoulder roll and neck extended. The horizontal incision was marked, locally anesthetized, prepped and draped in the usual fashion. A 15 blade was used to make an incision through the skin and subplatysmal flaps were elevated superiorly and inferiorly. The strap muscles were reflected laterally. Beginning on the patient's left side, the thyroid nodules were identified. Thyroid lobe was reflected medially and the recurrent laryngeal nerve was identified and preserved using the laryngeal nerve monitor. The inferior parathyroid gland was identified and reflected laterally. The superior vascular pedicle was ligated and the thyroid lobe was reflected medially. Attention was directed to the right side where the recurrent laryngeal nerve was once again identified and the parathyroid gland was identified and reflected laterally. The entire thyroid gland was then delivered to the back table where it was oriented for pathology. The wound was copiously irrigated with saline and the wound was closed over a 7 mm Jackson-Pratt drain. Incidentally, Surgicel was placed on each side at [facility] ligament. The patient was then taken off of the shoulder roll, returned to anesthesia, allowed to emerge from anesthesia in the operating room, and taken to the recovery room in stable condition. There were no complications. Estimated blood loss 10 mL.   ____________________________ J. Nadeen Landau,  MD jmc:drc D: 09/10/2011 19:25:43 ET T: 09/11/2011 07:55:15 ET JOB#: [id number]  cc: Janalee Dane, MD, <Dictator> Nicholos Johns MD ELECTRONICALLY SIGNED 09/14/2011 8:08

## 2014-11-13 ENCOUNTER — Encounter: Payer: Self-pay | Admitting: Gastroenterology

## 2014-11-13 ENCOUNTER — Ambulatory Visit: Payer: Self-pay | Admitting: Gastroenterology

## 2014-11-13 ENCOUNTER — Ambulatory Visit (INDEPENDENT_AMBULATORY_CARE_PROVIDER_SITE_OTHER): Payer: PPO | Admitting: Gastroenterology

## 2014-11-13 VITALS — BP 136/62 | HR 76 | Ht 65.5 in | Wt 183.2 lb

## 2014-11-13 DIAGNOSIS — K5732 Diverticulitis of large intestine without perforation or abscess without bleeding: Secondary | ICD-10-CM | POA: Diagnosis not present

## 2014-11-13 DIAGNOSIS — K589 Irritable bowel syndrome without diarrhea: Secondary | ICD-10-CM

## 2014-11-13 DIAGNOSIS — K760 Fatty (change of) liver, not elsewhere classified: Secondary | ICD-10-CM | POA: Diagnosis not present

## 2014-11-13 DIAGNOSIS — R1012 Left upper quadrant pain: Secondary | ICD-10-CM

## 2014-11-13 MED ORDER — HYOSCYAMINE SULFATE 0.125 MG SL SUBL: 0.1250 mg | SUBLINGUAL_TABLET | SUBLINGUAL | Status: DC | PRN

## 2014-11-13 NOTE — Patient Instructions (Signed)
You may begin taking a probiotic daily, these are over the counter such as; Align or Restora. We have sent the following medications to your pharmacy for you to pick up at your convenience: Levsin Recall colon is scheduled for 05/2016, you will get a letter to set that up. Your follow up appointment with Dr Fuller Plan is 12/31/14 11:15 am. CC:  Clayborn Bigness MD

## 2014-11-13 NOTE — Progress Notes (Signed)
    History of Present Illness: This is a 69 year old female who was referred by Lavera Guise, MD for the evaluation of an abnl abdominal/pelvic CT showing acute sigmoid diverticulitis, constipation, fatty liver. She has 2-3 months of alternating constipation and diarrhea associated with LUQ pain relieved by BM and frequent abdominal bloating. Had LLQ pain and CT as above. Treated with about 2 weeks of Cipro and Flagyl. Last colonoscopy 05/2013 showing sigmoid diverticulosis, and 9 small adenomatous colon polyps.   Review of Systems: Pertinent positive and negative review of systems were noted in the above HPI section. All other review of systems were otherwise negative.  Current Medications, Allergies, Past Medical History, Past Surgical History, Family History and Social History were reviewed in [employer] record.  Physical Exam: General: Well developed, well nourished, no acute distress Head: Normocephalic and atraumatic Eyes:  sclerae anicteric, EOMI Ears: Normal auditory acuity Mouth: No deformity or lesions Neck: Supple, no masses or thyromegaly Lungs: Clear throughout to auscultation Heart: Regular rate and rhythm; no murmurs, rubs or bruits Abdomen: Soft, non tender and non distended. No masses, hepatosplenomegaly or hernias noted. Normal Bowel sounds Rectal: deferred Musculoskeletal: Symmetrical with no gross deformities  Skin: No lesions on visible extremities Pulses:  Normal pulses noted Extremities: No clubbing, cyanosis, edema or deformities noted Neurological: Alert oriented x 4, grossly nonfocal Cervical Nodes:  No significant cervical adenopathy Inguinal Nodes: No significant inguinal adenopathy Psychological:  Alert and cooperative. Normal mood and affect  Assessment and Recommendations:  1. Resolving acute diverticulitis. High fiber diet and adequate daily water intake long term.   2. 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 Trial of Align and if not helpful then Restora. Levsin 1-2 qh4. REV in 6 weeks.   3. Fatty liver. Low fat, weight loss diet supervised by PCP.   4. Personal history of adenomatous colon polyps and family history colon cancer. Surveillance colonoscopy 05/2016.   cc: Lavera Guise, MD [address] [place], [place] [address]

## 2014-12-12 ENCOUNTER — Encounter: Payer: Self-pay | Admitting: *Deleted

## 2014-12-13 ENCOUNTER — Ambulatory Visit (INDEPENDENT_AMBULATORY_CARE_PROVIDER_SITE_OTHER): Payer: PPO | Admitting: Cardiovascular Disease

## 2014-12-13 ENCOUNTER — Encounter: Payer: Self-pay | Admitting: Cardiovascular Disease

## 2014-12-13 VITALS — BP 140/80 | HR 76 | Ht 65.5 in | Wt 180.5 lb

## 2014-12-13 DIAGNOSIS — R0602 Shortness of breath: Secondary | ICD-10-CM

## 2014-12-13 DIAGNOSIS — I10 Essential (primary) hypertension: Secondary | ICD-10-CM | POA: Insufficient documentation

## 2014-12-13 DIAGNOSIS — R42 Dizziness and giddiness: Secondary | ICD-10-CM | POA: Diagnosis not present

## 2014-12-13 DIAGNOSIS — R0789 Other chest pain: Secondary | ICD-10-CM | POA: Diagnosis not present

## 2014-12-13 DIAGNOSIS — I700 Atherosclerosis of aorta: Secondary | ICD-10-CM

## 2014-12-13 DIAGNOSIS — E785 Hyperlipidemia, unspecified: Secondary | ICD-10-CM | POA: Insufficient documentation

## 2014-12-13 DIAGNOSIS — E782 Mixed hyperlipidemia: Secondary | ICD-10-CM | POA: Insufficient documentation

## 2014-12-13 NOTE — Patient Instructions (Addendum)
Medication Instructions:  Your physician has recommended you make the following change in your medication:  STOP taking Pletal   Labwork: none  Testing/Procedures: Your physician has requested that you have a lexiscan myoview.  [place]  Your caregiver has ordered a Stress Test with nuclear imaging. The purpose of this test is to evaluate the blood supply to your heart muscle. This procedure is referred to as a "Non-Invasive Stress Test." This is because other than having an IV started in your vein, nothing is inserted or "invades" your body. Cardiac stress tests are done to find areas of poor blood flow to the heart by determining the extent of coronary artery disease (CAD). Some patients exercise on a treadmill, which naturally increases the blood flow to your heart, while others who are  unable to walk on a treadmill due to physical limitations have a pharmacologic/chemical stress agent called Lexiscan . This medicine will mimic walking on a treadmill by temporarily increasing your coronary blood flow.   Please note: these test may take anywhere between 2-4 hours to complete  PLEASE REPORT TO [place] TO GO  Date of Procedure: Wednesday, June 15, 7:30am  Arrival Time for Procedure: 7:00am Instructions regarding medication: You may take your morning medications with a sip of water.      PLEASE NOTIFY THE OFFICE AT LEAST 51 HOURS IN ADVANCE IF YOU ARE UNABLE TO KEEP YOUR APPOINTMENT.  [phone] AND  PLEASE NOTIFY NUCLEAR MEDICINE AT [facility] AT LEAST 24 HOURS IN ADVANCE IF YOU ARE UNABLE TO KEEP YOUR APPOINTMENT. [phone]  How to prepare for your Myoview test:  1. Do not eat or drink after midnight 2. No caffeine for 24 hours prior to test 3. No smoking 24 hours prior to test. 4. Your medication may be taken with water.  If your doctor stopped a medication because of this test, do not take that  medication. 5. Ladies, please do not wear dresses.  Skirts or pants are appropriate. Please wear a short sleeve shirt. 6. No perfume, cologne or lotion. 7. Wear comfortable walking shoes. No heels!             Follow-Up: Your physician recommends that you schedule a follow-up appointment with Dr. Fletcher Anon as needed.    Any Other Special Instructions Will Be Listed Below (If Applicable).

## 2014-12-13 NOTE — Assessment & Plan Note (Signed)
The patient symptoms and cramps are not consistent with claudication. She is able to walk for long distance without reproducible pain. She does have evidence of atherosclerosis. However, by physical exam, she has normal femoral and distal pulses and recent ABI was normal. Thus, I do not recommend an angiogram. I recommend aggressive treatment of risk factors as is being done. I discontinued cilostazol especially with her recent anemia.

## 2014-12-13 NOTE — Assessment & Plan Note (Signed)
Blood pressure is reasonably controlled on current medications. 

## 2014-12-13 NOTE — Progress Notes (Signed)
Primary care physician: Dr. Clayborn Bigness  HPI  This is a pleasant 69 year old female who was referred for evaluation of atypical chest pain. She has no previous cardiac history. She has known history of hypertension, hyperlipidemia and diet-controlled diabetes. The patient was diagnosed with anemia in April and was seen by gastroenterology with subsequent improvement. She had CT scan of the abdomen which showed atherosclerosis of the aorta. Carotid Doppler showed mild intimal thickening with calcified plaque bilaterally. ABI was normal bilaterally. Echocardiogram showed normal LV systolic function with no significant valvular abnormalities. Previous CT scan of the abdomen in January showed severe aortoiliofemoral atherosclerosis without evidence of aortic aneurysm. The patient complains of left upper quadrant pain which is described as aching sensation and cramping which is not related to activities. She does complain of exertional dyspnea. She describes cramping in both legs which she thinks is related to statin treatment. She was placed on Pletal with no significant improvement in symptoms. She was seen by Dr. dew.  Allergies  Allergen Reactions  . Cefaclor     REACTION: swelling/hives  . Penicillins     REACTION: swelling/hives     Current Outpatient Prescriptions on File Prior to Visit  Medication Sig Dispense Refill  . aspirin 81 MG tablet Take 81 mg by mouth daily.    . Calcium Carbonate-Vitamin D (CALCIUM + D PO) Take 1 tablet by mouth daily.     . cetirizine (ZYRTEC) 10 MG tablet Take 10 mg by mouth daily.    . fenofibrate 54 MG tablet Take 54 mg by mouth daily.    . hyoscyamine (LEVSIN SL) 0.125 MG SL tablet Place 1-2 tablets (0.125-0.25 mg total) under the tongue every 4 (four) hours as needed. 30 tablet 6  . ibuprofen (ADVIL,MOTRIN) 200 MG tablet Take 400 mg by mouth every 6 (six) hours as needed.    Marland Kitchen levothyroxine (SYNTHROID, LEVOTHROID) 112 MCG tablet Take 112 mcg by mouth. Take  one daily and 1/2 on Sunday    . pantoprazole (PROTONIX) 40 MG tablet Take 40 mg by mouth daily.    . valsartan (DIOVAN) 160 MG tablet Take 160 mg by mouth daily.     No current facility-administered medications on file prior to visit.     Past Medical History  Diagnosis Date  . Hypertension   . Hypothyroidism   . GERD (gastroesophageal reflux disease)   . Tubular adenoma of colon 05/2013  . Osteoporosis   . Anxiety   . Transient insomnia   . Allergic rhinitis   . Diabetes mellitus, type 2   . Obesity   . Diverticulitis   . Aortic atherosclerosis   . Hyperlipidemia   . Essential hypertension      Past Surgical History  Procedure Laterality Date  . Appendectomy    . Total thyroidectomy  09/2011  . Partial hysterectomy    . Robotic assisted salpingo oopherectomy      right ovary first removed then left ovary removed years later  . Bunionectomy      right foot  . Hammer toe surgery  12/2012    left toe  . Cholecystectomy    . Cardiac catheterization  [facility]      Family History  Problem Relation Age of Onset  . Throat cancer Father   . Colon cancer Maternal Aunt   . Colon cancer Maternal Uncle   . Colon polyps Maternal Aunt   . Colon polyps Maternal Uncle   . Diabetes Neg Hx   .  Kidney disease Neg Hx   . Esophageal cancer Neg Hx   . Gallbladder disease Neg Hx   . Heart disease Maternal Uncle      History   Social History  . Marital Status: Married    Spouse Name: N/A  . Number of Children: 2  . Years of Education: N/A   Occupational History  . Retired    Social History Main Topics  . Smoking status: Former Smoker -- 1.00 packs/day for 30 years    Types: Cigarettes    Quit date: 07/06/1998  . Smokeless tobacco: Never Used  . Alcohol Use: No  . Drug Use: No  . Sexual Activity: Not on file   Other Topics Concern  . Not on file   Social History Narrative     ROS A 10 point review of system was performed. It is negative  other than that mentioned in the history of present illness.   PHYSICAL EXAM   BP 140/80 mmHg  Pulse 76  Ht 5' 5.5" (1.664 m)  Wt 180 lb 8 oz (81.874 kg)  BMI 29.57 kg/m2 Constitutional: She is oriented to person, place, and time. She appears well-developed and well-nourished. No distress.  HENT: No nasal discharge.  Head: Normocephalic and atraumatic.  Eyes: Pupils are equal and round. No discharge.  Neck: Normal range of motion. Neck supple. No JVD present. No thyromegaly present.  Cardiovascular: Normal rate, regular rhythm, normal heart sounds. Exam reveals no gallop and no friction rub. No murmur heard.  Pulmonary/Chest: Effort normal and breath sounds normal. No stridor. No respiratory distress. She has no wheezes. She has no rales. She exhibits no tenderness.  Abdominal: Soft. Bowel sounds are normal. She exhibits no distension. There is no tenderness. There is no rebound and no guarding.  Musculoskeletal: Normal range of motion. She exhibits no edema and no tenderness.  Neurological: She is alert and oriented to person, place, and time. Coordination normal.  Skin: Skin is warm and dry. No rash noted. She is not diaphoretic. No erythema. No pallor.  Psychiatric: She has a normal mood and affect. Her behavior is normal. Judgment and thought content normal.  Vascular: Femoral pulses are normal. Dorsalis pedis: Normal bilaterally. Posterior tibial: Normal bilaterally.   ZOX:WRUEA  Rhythm  WITHIN NORMAL LIMITS   ASSESSMENT AND PLAN

## 2014-12-13 NOTE — Assessment & Plan Note (Signed)
Her chest pain is overall atypical but she does have exertional dyspnea and multiple risk factors for coronary artery disease. Thus, I recommend evaluation with a pharmacologic nuclear stress test. She is not able to exercise on a treadmill due to injury to the right foot.

## 2014-12-19 ENCOUNTER — Encounter
Admission: RE | Admit: 2014-12-19 | Discharge: 2014-12-19 | Disposition: A | Discharge status: Home / Self Care | Payer: PPO | Source: Ambulatory Visit | Attending: Cardiovascular Disease | Admitting: Cardiovascular Disease

## 2014-12-19 DIAGNOSIS — R0789 Other chest pain: Secondary | ICD-10-CM | POA: Diagnosis not present

## 2014-12-19 LAB — NM MYOCAR MULTI W/SPECT W/WALL MOTION / EF
CHL CUP NUCLEAR SRS: 0
CSEPPHR: 109 {beats}/min
LV sys vol: 15 mL
LVDIAVOL: 48 mL
Percent HR: 70 %
Rest HR: 64 {beats}/min
SDS: 0
SSS: 1
TID: 0.91

## 2014-12-19 MED ORDER — REGADENOSON 0.4 MG/5ML IV SOLN
0.4000 mg | Freq: Once | INTRAVENOUS | Status: AC
  Administered 2014-12-19: 0.4 mg via INTRAVENOUS
  Filled 2014-12-19: qty 5

## 2014-12-19 MED ORDER — TECHNETIUM TC 99M SESTAMIBI - CARDIOLITE
33.0000 | Freq: Once | INTRAVENOUS | Status: AC | PRN
  Administered 2014-12-19: 09:00:00 31.693 via INTRAVENOUS

## 2014-12-19 MED ORDER — TECHNETIUM TC 99M SESTAMIBI - CARDIOLITE
13.0000 | Freq: Once | INTRAVENOUS | Status: AC | PRN
  Administered 2014-12-19: 14.095 via INTRAVENOUS

## 2014-12-31 ENCOUNTER — Ambulatory Visit: Payer: PPO | Admitting: Gastroenterology

## 2015-01-14 ENCOUNTER — Ambulatory Visit: Payer: PPO | Admitting: Gastroenterology

## 2015-02-20 ENCOUNTER — Ambulatory Visit (INDEPENDENT_AMBULATORY_CARE_PROVIDER_SITE_OTHER): Payer: PPO | Admitting: Gastroenterology

## 2015-02-20 ENCOUNTER — Encounter: Payer: Self-pay | Admitting: Gastroenterology

## 2015-02-20 VITALS — BP 122/74 | HR 76 | Ht 65.5 in | Wt 183.2 lb

## 2015-02-20 DIAGNOSIS — D649 Anemia, unspecified: Secondary | ICD-10-CM

## 2015-02-20 DIAGNOSIS — Z86010 Personal history of colonic polyps: Secondary | ICD-10-CM

## 2015-02-20 DIAGNOSIS — K589 Irritable bowel syndrome without diarrhea: Secondary | ICD-10-CM

## 2015-02-20 DIAGNOSIS — Z860101 Personal history of adenomatous and serrated colon polyps: Secondary | ICD-10-CM

## 2015-02-20 NOTE — Patient Instructions (Signed)
Follow the instructions on the Hemoccult cards and mail them back to us when you are finished or you may take them directly to the lab in the basement of the [facility] building. We will call you with the results.   Thank you for choosing me and [facility].  Malcolm T. Stark, Jr., MD., FACG   

## 2015-02-20 NOTE — Progress Notes (Signed)
    History of Present Illness: This is a 69 year old female turning for follow-up of IBS. Her abdominal pain has improved substantially. She notes some mild upper abdominal discomfort since being placed on Cipro. She's been followed for a microcytic anemia by her primary care physician. She was placed on iron supplements. Blood work from 10/14/2014 shows hemoglobin=9.9 MCV=80. Blood work from 11/15/2014 shows hemoglobin=10.7 and MCV=74. Iron studies, B12 and folate were normal. Hemoglobin=15.4 from her PCPs office last week. She has a history of frequent blood donation over the years. She underwent colonoscopy in November 2014 that showed several small adenomatous colon polyps, mild diverticulosis and small internal hemorrhoids. No hematochezia or melena.   Current Medications, Allergies, Past Medical History, Past Surgical History, Family History and Social History were reviewed in [employer] record.  Physical Exam: General: Well developed , well nourished, no acute distress Head: Normocephalic and atraumatic Eyes:  sclerae anicteric, EOMI Ears: Normal auditory acuity Mouth: No deformity or lesions Lungs: Clear throughout to auscultation Heart: Regular rate and rhythm; no murmurs, rubs or bruits Abdomen: Soft, non tender and non distended. No masses, hepatosplenomegaly or hernias noted. Normal Bowel sounds Musculoskeletal: Symmetrical with no gross deformities  Pulses:  Normal pulses noted Extremities: No clubbing, cyanosis, edema or deformities noted Neurological: Alert oriented x 4, grossly nonfocal Psychological:  Alert and cooperative. Normal mood and affect  Assessment and Recommendations:  1. Microcytic anemia with normal iron studies, B12 and folate. On Fe per PCP and Hb has normalized. Check stool Hemoccults. Last colonoscopy 04/2013. Hold on any blood donation for now.   2. Presumed IBS with mild constipation, alternating with diarrhea, intermittent  LUQ'epigastric pain and abdominal bloating. No diarrhea recently since Fe was started. Levsin 1-2 qh4 prn.    3. Fatty liver. Low fat, weight loss diet supervised by PCP.   4. Personal history of adenomatous colon polyps and family history colon cancer. Surveillance colonoscopy 05/2016.

## 2015-02-27 ENCOUNTER — Other Ambulatory Visit (INDEPENDENT_AMBULATORY_CARE_PROVIDER_SITE_OTHER): Payer: PPO

## 2015-02-27 DIAGNOSIS — D649 Anemia, unspecified: Secondary | ICD-10-CM | POA: Diagnosis not present

## 2015-02-27 LAB — HEMOCCULT SLIDES (X 3 CARDS)
Fecal Occult Blood: NEGATIVE
OCCULT 1: NEGATIVE
OCCULT 2: NEGATIVE
OCCULT 3: NEGATIVE
OCCULT 4: NEGATIVE
OCCULT 5: NEGATIVE

## 2015-07-09 DIAGNOSIS — R946 Abnormal results of thyroid function studies: Secondary | ICD-10-CM | POA: Diagnosis not present

## 2015-07-12 DIAGNOSIS — J019 Acute sinusitis, unspecified: Secondary | ICD-10-CM | POA: Diagnosis not present

## 2015-07-12 DIAGNOSIS — G4733 Obstructive sleep apnea (adult) (pediatric): Secondary | ICD-10-CM | POA: Diagnosis not present

## 2015-08-05 DIAGNOSIS — G4733 Obstructive sleep apnea (adult) (pediatric): Secondary | ICD-10-CM | POA: Diagnosis not present

## 2015-08-05 DIAGNOSIS — I10 Essential (primary) hypertension: Secondary | ICD-10-CM | POA: Diagnosis not present

## 2015-08-05 DIAGNOSIS — E119 Type 2 diabetes mellitus without complications: Secondary | ICD-10-CM | POA: Diagnosis not present

## 2015-08-05 DIAGNOSIS — R42 Dizziness and giddiness: Secondary | ICD-10-CM | POA: Diagnosis not present

## 2015-08-26 ENCOUNTER — Encounter (HOSPITAL_COMMUNITY): Payer: Self-pay

## 2015-08-26 DIAGNOSIS — Z87891 Personal history of nicotine dependence: Secondary | ICD-10-CM | POA: Diagnosis not present

## 2015-08-26 DIAGNOSIS — E119 Type 2 diabetes mellitus without complications: Secondary | ICD-10-CM | POA: Insufficient documentation

## 2015-08-26 DIAGNOSIS — E669 Obesity, unspecified: Secondary | ICD-10-CM | POA: Diagnosis not present

## 2015-08-26 DIAGNOSIS — K219 Gastro-esophageal reflux disease without esophagitis: Secondary | ICD-10-CM | POA: Insufficient documentation

## 2015-08-26 DIAGNOSIS — R600 Localized edema: Secondary | ICD-10-CM | POA: Diagnosis not present

## 2015-08-26 DIAGNOSIS — Z792 Long term (current) use of antibiotics: Secondary | ICD-10-CM | POA: Insufficient documentation

## 2015-08-26 DIAGNOSIS — E039 Hypothyroidism, unspecified: Secondary | ICD-10-CM | POA: Insufficient documentation

## 2015-08-26 DIAGNOSIS — R609 Edema, unspecified: Secondary | ICD-10-CM | POA: Diagnosis not present

## 2015-08-26 DIAGNOSIS — Z8659 Personal history of other mental and behavioral disorders: Secondary | ICD-10-CM | POA: Insufficient documentation

## 2015-08-26 DIAGNOSIS — Z79899 Other long term (current) drug therapy: Secondary | ICD-10-CM | POA: Diagnosis not present

## 2015-08-26 DIAGNOSIS — M810 Age-related osteoporosis without current pathological fracture: Secondary | ICD-10-CM | POA: Insufficient documentation

## 2015-08-26 DIAGNOSIS — R202 Paresthesia of skin: Secondary | ICD-10-CM | POA: Diagnosis not present

## 2015-08-26 DIAGNOSIS — I10 Essential (primary) hypertension: Secondary | ICD-10-CM | POA: Insufficient documentation

## 2015-08-26 DIAGNOSIS — Z9889 Other specified postprocedural states: Secondary | ICD-10-CM | POA: Insufficient documentation

## 2015-08-26 DIAGNOSIS — Z7982 Long term (current) use of aspirin: Secondary | ICD-10-CM | POA: Insufficient documentation

## 2015-08-26 DIAGNOSIS — Z86018 Personal history of other benign neoplasm: Secondary | ICD-10-CM | POA: Insufficient documentation

## 2015-08-26 DIAGNOSIS — Z880 Allergy status to penicillin: Secondary | ICD-10-CM | POA: Insufficient documentation

## 2015-08-26 DIAGNOSIS — I2510 Atherosclerotic heart disease of native coronary artery without angina pectoris: Secondary | ICD-10-CM | POA: Diagnosis not present

## 2015-08-26 DIAGNOSIS — E785 Hyperlipidemia, unspecified: Secondary | ICD-10-CM | POA: Insufficient documentation

## 2015-08-26 LAB — DIFFERENTIAL
Basophils Absolute: 0.1 10*3/uL (ref 0.0–0.1)
Basophils Relative: 1 %
EOS PCT: 2 %
Eosinophils Absolute: 0.1 10*3/uL (ref 0.0–0.7)
LYMPHS PCT: 36 %
Lymphs Abs: 1.9 10*3/uL (ref 0.7–4.0)
MONO ABS: 0.4 10*3/uL (ref 0.1–1.0)
MONOS PCT: 8 %
NEUTROS ABS: 2.8 10*3/uL (ref 1.7–7.7)
Neutrophils Relative %: 53 %

## 2015-08-26 LAB — COMPREHENSIVE METABOLIC PANEL
ALK PHOS: 49 U/L (ref 38–126)
ALT: 34 U/L (ref 14–54)
ANION GAP: 9 (ref 5–15)
AST: 23 U/L (ref 15–41)
Albumin: 3.8 g/dL (ref 3.5–5.0)
BILIRUBIN TOTAL: 0.7 mg/dL (ref 0.3–1.2)
BUN: 12 mg/dL (ref 6–20)
CALCIUM: 9.5 mg/dL (ref 8.9–10.3)
CO2: 26 mmol/L (ref 22–32)
CREATININE: 0.92 mg/dL (ref 0.44–1.00)
Chloride: 104 mmol/L (ref 101–111)
Glucose, Bld: 114 mg/dL — ABNORMAL HIGH (ref 65–99)
Potassium: 3.7 mmol/L (ref 3.5–5.1)
Sodium: 139 mmol/L (ref 135–145)
TOTAL PROTEIN: 6 g/dL — AB (ref 6.5–8.1)

## 2015-08-26 LAB — I-STAT TROPONIN, ED: TROPONIN I, POC: 0 ng/mL (ref 0.00–0.08)

## 2015-08-26 LAB — I-STAT CHEM 8, ED
BUN: 14 mg/dL (ref 6–20)
CALCIUM ION: 1.18 mmol/L (ref 1.13–1.30)
CHLORIDE: 103 mmol/L (ref 101–111)
Creatinine, Ser: 0.9 mg/dL (ref 0.44–1.00)
GLUCOSE: 111 mg/dL — AB (ref 65–99)
HCT: 41 % (ref 36.0–46.0)
HEMOGLOBIN: 13.9 g/dL (ref 12.0–15.0)
Potassium: 3.6 mmol/L (ref 3.5–5.1)
Sodium: 143 mmol/L (ref 135–145)
TCO2: 28 mmol/L (ref 0–100)

## 2015-08-26 LAB — CBC
HEMATOCRIT: 39.6 % (ref 36.0–46.0)
Hemoglobin: 13.2 g/dL (ref 12.0–15.0)
MCH: 29.5 pg (ref 26.0–34.0)
MCHC: 33.3 g/dL (ref 30.0–36.0)
MCV: 88.4 fL (ref 78.0–100.0)
Platelets: 199 10*3/uL (ref 150–400)
RBC: 4.48 MIL/uL (ref 3.87–5.11)
RDW: 12.6 % (ref 11.5–15.5)
WBC: 5.3 10*3/uL (ref 4.0–10.5)

## 2015-08-26 LAB — PROTIME-INR
INR: 1.05 (ref 0.00–1.49)
PROTHROMBIN TIME: 13.9 s (ref 11.6–15.2)

## 2015-08-26 LAB — APTT: aPTT: 28 seconds (ref 24–37)

## 2015-08-26 NOTE — ED Notes (Addendum)
Pt states her BP has been up today. Was reading in 190/81 and at [facility] it was 201/89 and HR 70. Pt states she has some tingling in her left arm but denies any pain. The tingling started she thinks in the last hour. States she cooked dinner and walked today. Has taken her medicine.

## 2015-08-27 ENCOUNTER — Emergency Department (HOSPITAL_COMMUNITY)
Admission: EM | Admit: 2015-08-27 | Discharge: 2015-08-27 | Disposition: A | Discharge status: Home / Self Care | Payer: PPO | Attending: Emergency Medicine | Admitting: Emergency Medicine

## 2015-08-27 DIAGNOSIS — I10 Essential (primary) hypertension: Secondary | ICD-10-CM

## 2015-08-27 DIAGNOSIS — R609 Edema, unspecified: Secondary | ICD-10-CM

## 2015-08-27 MED ORDER — HYDROCHLOROTHIAZIDE 12.5 MG PO TABS: 12.5000 mg | ORAL_TABLET | Freq: Every day | ORAL | Status: DC

## 2015-08-27 NOTE — ED Provider Notes (Signed)
CSN: [id number]     Arrival date & time 08/26/15  1956 History  By signing my name below, I, [name], attest that this documentation has been prepared under the direction and in the presence of Delora Fuel, MD . Electronically Signed: [name], Scribe. 08/27/2015. 2:32 AM.   Chief Complaint  Patient presents with  . Hypertension  . Tingling   The history is provided by the patient. No language interpreter was used.   HPI Comments:  Toni [name] is a 70 y.o. female with PMHx of HTN, hypothyroidism, DM, and HLD who presents to the Emergency Department complaining of hypertension yesterday, [facility] 201[facility]. Pt reports associated tingling in left arm and mild swelling in BL feet. No alleviating factors noted. She states she doesn't feel bad currently. Pt also notes recent vacation to the beach. Pt is compliant with current blood pressure medications. Pt is a non-smoker and denies ETOH use. She denies chest pain, chest tightness, nose bleeds, shortness of breath, or any other symptoms at this time.   Past Medical History  Diagnosis Date  . Hypertension   . Hypothyroidism   . GERD (gastroesophageal reflux disease)   . Tubular adenoma of colon 05/2013  . Osteoporosis   . Anxiety   . Transient insomnia   . Allergic rhinitis   . Diabetes mellitus, type 2 ([place])   . Obesity   . Diverticulitis   . Aortic atherosclerosis ([place])   . Hyperlipidemia   . Essential hypertension    Past Surgical History  Procedure Laterality Date  . Appendectomy    . Total thyroidectomy  09/2011  . Partial hysterectomy    . Robotic assisted salpingo oopherectomy      right ovary first removed then left ovary removed years later  . Bunionectomy      right foot  . Hammer toe surgery  12/2012    left toe  . Cholecystectomy    . Cardiac catheterization  [facility]    Family History  Problem Relation Age of Onset  . Throat cancer Father   . Colon cancer Maternal Aunt   .  Colon cancer Maternal Uncle   . Colon polyps Maternal Aunt   . Colon polyps Maternal Uncle   . Diabetes Neg Hx   . Kidney disease Neg Hx   . Esophageal cancer Neg Hx   . Gallbladder disease Neg Hx   . Heart disease Maternal Uncle    Social History  Substance Use Topics  . Smoking status: Former Smoker -- 1.00 packs/day for 30 years    Types: Cigarettes    Quit date: 07/06/1998  . Smokeless tobacco: Never Used  . Alcohol Use: No   OB History    No data available     Review of Systems  HENT: Negative for nosebleeds.   Respiratory: Negative for chest tightness and shortness of breath.   Cardiovascular: Positive for leg swelling (feet). Negative for chest pain.       Hypertension  Neurological: Positive for numbness.  All other systems reviewed and are negative.  Allergies  Cefaclor and Penicillins  Home Medications   Prior to Admission medications   Medication Sig Start Date End Date Taking? Authorizing Provider  aspirin 81 MG tablet Take 81 mg by mouth daily.    Historical Provider, MD  Calcium Carbonate-Vitamin D (CALCIUM + D PO) Take 1 tablet by mouth daily.     Historical Provider, MD  cetirizine (ZYRTEC) 10 MG tablet  Take 10 mg by mouth as needed.     Historical Provider, MD  ciprofloxacin (CIPRO) 500 MG tablet Take 1 tablet by mouth 2 (two) times daily. 02/15/15   Historical Provider, MD  FeFum-FePoly-FA-B Cmp-C-Biot (INTEGRA PLUS) CAPS Take 1 tablet by mouth daily. 02/04/15   Historical Provider, MD  fenofibrate 54 MG tablet Take 54 mg by mouth daily.    Historical Provider, MD  hyoscyamine (LEVSIN SL) 0.125 MG SL tablet Place 1-2 tablets (0.125-0.25 mg total) under the tongue every 4 (four) hours as needed. Patient not taking: Reported on 02/20/2015 11/13/14   Ladene Artist, MD  levothyroxine (SYNTHROID, LEVOTHROID) 112 MCG tablet Take 112 mcg by mouth. Take one daily and 1/2 on Sunday    Historical Provider, MD  pantoprazole (PROTONIX) 40 MG tablet Take 40 mg by mouth  daily.    Historical Provider, MD  rosuvastatin (CRESTOR) 20 MG tablet Take 1 tablet by mouth daily. 01/24/15   Historical Provider, MD  valsartan (DIOVAN) 160 MG tablet Take 160 mg by mouth daily.    Historical Provider, MD   BP 169/70 mmHg  Pulse 65  Temp(Src) 98 F (36.7 C) (Oral)  Resp 18  Ht 5\' 6"  (1.676 m)  Wt 190 lb 4.8 oz (86.32 kg)  BMI 30.73 kg/m2  SpO2 99% Physical Exam  Constitutional: She is oriented to person, place, and time. She appears well-developed and well-nourished. No distress.  HENT:  Head: Normocephalic and atraumatic.  Right Ear: Hearing normal.  Left Ear: Hearing normal.  Nose: Nose normal.  Mouth/Throat: Oropharynx is clear and moist and mucous membranes are normal.  Eyes: Conjunctivae and EOM are normal. Pupils are equal, round, and reactive to light.  Neck: Normal range of motion. Neck supple. No JVD present.  Cardiovascular: Regular rhythm, S1 normal and S2 normal.  Exam reveals no gallop and no friction rub.   No murmur heard. Pulmonary/Chest: Effort normal and breath sounds normal. She has no wheezes. She has no rales. She exhibits no tenderness.  Abdominal: Soft. Normal appearance and bowel sounds are normal. She exhibits no distension and no mass. There is no hepatosplenomegaly. There is no tenderness. There is no tenderness at McBurney's point and negative Murphy's sign. No hernia.  Musculoskeletal: Normal range of motion.  1+ pitting edema BL  Lymphadenopathy:    She has no cervical adenopathy.  Neurological: She is alert and oriented to person, place, and time. She has normal strength. No cranial nerve deficit or sensory deficit. Coordination normal. GCS eye subscore is 4. GCS verbal subscore is 5. GCS motor subscore is 6.  Skin: Skin is warm, dry and intact. No rash noted. No cyanosis.  Psychiatric: She has a normal mood and affect. Her speech is normal and behavior is normal. Judgment and thought content normal.  Nursing note and vitals  reviewed.  ED Course  Procedures  DIAGNOSTIC STUDIES: Oxygen Saturation is 99% on RA, normal by my interpretation.    COORDINATION OF CARE: 2:29 AM Will prescribe diuretic prior to discharge. Advised pt on proper blood pressure protocol. Discussed treatment plan with pt at bedside and pt agreed to plan.  Labs Review Labs Reviewed  COMPREHENSIVE METABOLIC PANEL - Abnormal; Notable for the following:    Glucose, Bld 114 (*)    Total Protein 6.0 (*)    All other components within normal limits  I-STAT CHEM 8, ED - Abnormal; Notable for the following:    Glucose, Bld 111 (*)    All other components within  normal limits  PROTIME-INR  APTT  CBC  DIFFERENTIAL  I-STAT TROPOININ, ED   I have personally reviewed and evaluated these lab results as part of my medical decision-making.    EKG Interpretation  Date/Time:  Monday August 26 2015 20:18:05 EST Ventricular Rate:  73 PR Interval:  156 QRS Duration: 86 QT Interval:  410 QTC Calculation: 451 R Axis:   64 Text Interpretation:  Normal sinus rhythm Normal ECG When compared with  ECG of 10/14/2014, No significant change was found Confirmed by [facility]  MD,  Lambros Cerro ([id number]) on 08/27/2015 2:18:42 AM      MDM   Final diagnoses:  Essential hypertension  Peripheral edema   Essential hypertension. Patient has spent several weeks at the beach on vacation and admits to having excessive salt intake during that time. This would account for her peripheral edema and probably loss of control of her blood pressure. Subjective numbness is of uncertain cause but she has normal neurologic exam currently. She is discharged with prescription for hydrochlorothiazide to try to take care of her edema and also assist in blood pressure control. This is likely to only be needed short-term. She has an appointment scheduled with her PCP in several weeks and she is to keep that appointment. Continue monitoring blood pressure at home. Old records are reviewed  and the last several office visits have had normal blood pressures.  I personally performed the services described in this documentation, which was scribed in my presence. The recorded information has been reviewed and is accurate.      Delora Fuel, MD [id number]

## 2015-08-27 NOTE — ED Notes (Signed)
Patuient able todress and ambulate independently

## 2015-08-27 NOTE — Discharge Instructions (Signed)
Continue to monitor your blood pressure every day. Stay on a low salt diet.  Hypertension Hypertension, commonly called high blood pressure, is when the force of blood pumping through your arteries is too strong. Your arteries are the blood vessels that carry blood from your heart throughout your body. A blood pressure reading consists of a higher number over a lower number, such as 110/72. The higher number (systolic) is the pressure inside your arteries when your heart pumps. The lower number (diastolic) is the pressure inside your arteries when your heart relaxes. Ideally you want your blood pressure below 120/80. Hypertension forces your heart to work harder to pump blood. Your arteries may become narrow or stiff. Having untreated or uncontrolled hypertension can cause heart attack, stroke, kidney disease, and other problems. RISK FACTORS Some risk factors for high blood pressure are controllable. Others are not.  Risk factors you cannot control include:   Race. You may be at higher risk if you are African American.  Age. Risk increases with age.  Gender. Men are at higher risk than women before age 87 years. After age 42, women are at higher risk than men. Risk factors you can control include:  Not getting enough exercise or physical activity.  Being overweight.  Getting too much fat, sugar, calories, or salt in your diet.  Drinking too much alcohol. SIGNS AND SYMPTOMS Hypertension does not usually cause signs or symptoms. Extremely high blood pressure (hypertensive crisis) may cause headache, anxiety, shortness of breath, and nosebleed. DIAGNOSIS To check if you have hypertension, your health care provider will measure your blood pressure while you are seated, with your arm held at the level of your heart. It should be measured at least twice using the same arm. Certain conditions can cause a difference in blood pressure between your right and left arms. A blood pressure reading that  is higher than normal on one occasion does not mean that you need treatment. If it is not clear whether you have high blood pressure, you may be asked to return on a different day to have your blood pressure checked again. Or, you may be asked to monitor your blood pressure at home for 1 or more weeks. TREATMENT Treating high blood pressure includes making lifestyle changes and possibly taking medicine. Living a healthy lifestyle can help lower high blood pressure. You may need to change some of your habits. Lifestyle changes may include:  Following the DASH diet. This diet is high in fruits, vegetables, and whole grains. It is low in salt, red meat, and added sugars.  Keep your sodium intake below 2,300 mg per day.  Getting at least 30-45 minutes of aerobic exercise at least 4 times per week.  Losing weight if necessary.  Not smoking.  Limiting alcoholic beverages.  Learning ways to reduce stress. Your health care provider may prescribe medicine if lifestyle changes are not enough to get your blood pressure under control, and if one of the following is true:  You are 108-77 years of age and your systolic blood pressure is above 140.  You are 33 years of age or older, and your systolic blood pressure is above 150.  Your diastolic blood pressure is above 90.  You have diabetes, and your systolic blood pressure is over [id number] or your diastolic blood pressure is over 90.  You have kidney disease and your blood pressure is above 140/90.  You have heart disease and your blood pressure is above 140/90. Your personal target blood pressure  may vary depending on your medical conditions, your age, and other factors. HOME CARE INSTRUCTIONS  Have your blood pressure rechecked as directed by your health care provider.   Take medicines only as directed by your health care provider. Follow the directions carefully. Blood pressure medicines must be taken as prescribed. The medicine does not work as  well when you skip doses. Skipping doses also puts you at risk for problems.  Do not smoke.   Monitor your blood pressure at home as directed by your health care provider. SEEK MEDICAL CARE IF:   You think you are having a reaction to medicines taken.  You have recurrent headaches or feel dizzy.  You have swelling in your ankles.  You have trouble with your vision. SEEK IMMEDIATE MEDICAL CARE IF:  You develop a severe headache or confusion.  You have unusual weakness, numbness, or feel faint.  You have severe chest or abdominal pain.  You vomit repeatedly.  You have trouble breathing. MAKE SURE YOU:   Understand these instructions.  Will watch your condition.  Will get help right away if you are not doing well or get worse.   This information is not intended to replace advice given to you by your health care provider. Make sure you discuss any questions you have with your health care provider.   Document Released: 06/22/2005 Document Revised: 11/06/2014 Document Reviewed: 04/14/2013 Elsevier Interactive Patient Education 2016 Elsevier Inc.   Edema Edema is an abnormal buildup of fluids in your bodytissues. Edema is somewhatdependent on gravity to pull the fluid to the lowest place in your body. That makes the condition more common in the legs and thighs (lower extremities). Painless swelling of the feet and ankles is common and becomes more likely as you get older. It is also common in looser tissues, like around your eyes.  When the affected area is squeezed, the fluid may move out of that spot and leave a dent for a few moments. This dent is called pitting.  CAUSES  There are many possible causes of edema. Eating too much salt and being on your feet or sitting for a long time can cause edema in your legs and ankles. Hot weather may make edema worse. Common medical causes of edema include:  Heart failure.  Liver disease.  Kidney disease.  Weak blood vessels in  your legs.  Cancer.  An injury.  Pregnancy.  Some medications.  Obesity. SYMPTOMS  Edema is usually painless.Your skin may look swollen or shiny.  DIAGNOSIS  Your health care provider may be able to diagnose edema by asking about your medical history and doing a physical exam. You may need to have tests such as X-rays, an electrocardiogram, or blood tests to check for medical conditions that may cause edema.  TREATMENT  Edema treatment depends on the cause. If you have heart, liver, or kidney disease, you need the treatment appropriate for these conditions. General treatment may include:  Elevation of the affected body part above the level of your heart.  Compression of the affected body part. Pressure from elastic bandages or support stockings squeezes the tissues and forces fluid back into the blood vessels. This keeps fluid from entering the tissues.  Restriction of fluid and salt intake.  Use of a water pill (diuretic). These medications are appropriate only for some types of edema. They pull fluid out of your body and make you urinate more often. This gets rid of fluid and reduces swelling, but diuretics can have  side effects. Only use diuretics as directed by your health care provider. HOME CARE INSTRUCTIONS   Keep the affected body part above the level of your heart when you are lying down.   Do not sit still or stand for prolonged periods.   Do not put anything directly under your knees when lying down.  Do not wear constricting clothing or garters on your upper legs.   Exercise your legs to work the fluid back into your blood vessels. This may help the swelling go down.   Wear elastic bandages or support stockings to reduce ankle swelling as directed by your health care provider.   Eat a low-salt diet to reduce fluid if your health care provider recommends it.   Only take medicines as directed by your health care provider. SEEK MEDICAL CARE IF:   Your  edema is not responding to treatment.  You have heart, liver, or kidney disease and notice symptoms of edema.  You have edema in your legs that does not improve after elevating them.   You have sudden and unexplained weight gain. SEEK IMMEDIATE MEDICAL CARE IF:   You develop shortness of breath or chest pain.   You cannot breathe when you lie down.  You develop pain, redness, or warmth in the swollen areas.   You have heart, liver, or kidney disease and suddenly get edema.  You have a fever and your symptoms suddenly get worse. MAKE SURE YOU:   Understand these instructions.  Will watch your condition.  Will get help right away if you are not doing well or get worse.   This information is not intended to replace advice given to you by your health care provider. Make sure you discuss any questions you have with your health care provider.   Document Released: 06/22/2005 Document Revised: 07/13/2014 Document Reviewed: 04/14/2013 Elsevier Interactive Patient Education 2016 [place] DASH stands for "Dietary Approaches to Stop Hypertension." The DASH eating plan is a healthy eating plan that has been shown to reduce high blood pressure (hypertension). Additional health benefits may include reducing the risk of type 2 diabetes mellitus, heart disease, and stroke. The DASH eating plan may also help with weight loss. WHAT DO I NEED TO KNOW ABOUT THE DASH EATING PLAN? For the DASH eating plan, you will follow these general guidelines:  Choose foods with a percent daily value for sodium of less than 5% (as listed on the food label).  Use salt-free seasonings or herbs instead of table salt or sea salt.  Check with your health care provider or pharmacist before using salt substitutes.  Eat lower-sodium products, often labeled as "lower sodium" or "no salt added."  Eat fresh foods.  Eat more vegetables, fruits, and low-fat dairy products.  Choose whole  grains. Look for the word "whole" as the first word in the ingredient list.  Choose fish and skinless chicken or [place] more often than red meat. Limit fish, poultry, and meat to 6 oz (170 g) each day.  Limit sweets, desserts, sugars, and sugary drinks.  Choose heart-healthy fats.  Limit cheese to 1 oz (28 g) per day.  Eat more home-cooked food and less restaurant, buffet, and fast food.  Limit fried foods.  Cook foods using methods other than frying.  Limit canned vegetables. If you do use them, rinse them well to decrease the sodium.  When eating at a restaurant, ask that your food be prepared with less salt, or no salt if  possible. WHAT FOODS CAN I EAT? Seek help from a dietitian for individual calorie needs. Grains Whole grain or whole wheat bread. Brown rice. Whole grain or whole wheat pasta. Quinoa, bulgur, and whole grain cereals. Low-sodium cereals. Corn or whole wheat flour tortillas. Whole grain cornbread. Whole grain crackers. Low-sodium crackers. Vegetables Fresh or frozen vegetables (raw, steamed, roasted, or grilled). Low-sodium or reduced-sodium tomato and vegetable juices. Low-sodium or reduced-sodium tomato sauce and paste. Low-sodium or reduced-sodium canned vegetables.  Fruits All fresh, canned (in natural juice), or frozen fruits. Meat and Other Protein Products Ground beef (85% or leaner), grass-fed beef, or beef trimmed of fat. Skinless chicken or [place]. Ground chicken or [place]. Pork trimmed of fat. All fish and seafood. Eggs. Dried beans, peas, or lentils. Unsalted nuts and seeds. Unsalted canned beans. Dairy Low-fat dairy products, such as skim or 1% milk, 2% or reduced-fat cheeses, low-fat ricotta or cottage cheese, or plain low-fat yogurt. Low-sodium or reduced-sodium cheeses. Fats and Oils Tub margarines without trans fats. Light or reduced-fat mayonnaise and salad dressings (reduced sodium). Avocado. Safflower, olive, or canola oils. Natural peanut or  almond butter. Other Unsalted popcorn and pretzels. The items listed above may not be a complete list of recommended foods or beverages. Contact your dietitian for more options. WHAT FOODS ARE NOT RECOMMENDED? Grains White bread. White pasta. White rice. Refined cornbread. Bagels and croissants. Crackers that contain trans fat. Vegetables Creamed or fried vegetables. Vegetables in a cheese sauce. Regular canned vegetables. Regular canned tomato sauce and paste. Regular tomato and vegetable juices. Fruits Dried fruits. Canned fruit in light or heavy syrup. Fruit juice. Meat and Other Protein Products Fatty cuts of meat. Ribs, chicken wings, bacon, sausage, bologna, salami, chitterlings, fatback, hot dogs, bratwurst, and packaged luncheon meats. Salted nuts and seeds. Canned beans with salt. Dairy Whole or 2% milk, cream, half-and-half, and cream cheese. Whole-fat or sweetened yogurt. Full-fat cheeses or blue cheese. Nondairy creamers and whipped toppings. Processed cheese, cheese spreads, or cheese curds. Condiments Onion and garlic salt, seasoned salt, table salt, and sea salt. Canned and packaged gravies. Worcestershire sauce. Tartar sauce. Barbecue sauce. Teriyaki sauce. Soy sauce, including reduced sodium. Steak sauce. Fish sauce. Oyster sauce. Cocktail sauce. Horseradish. Ketchup and mustard. Meat flavorings and tenderizers. Bouillon cubes. Hot sauce. Tabasco sauce. Marinades. Taco seasonings. Relishes. Fats and Oils Butter, stick margarine, lard, shortening, ghee, and bacon fat. Coconut, palm kernel, or palm oils. Regular salad dressings. Other Pickles and olives. Salted popcorn and pretzels. The items listed above may not be a complete list of foods and beverages to avoid. Contact your dietitian for more information. WHERE CAN I FIND MORE INFORMATION? National Heart, Lung, and Blood Institute: travelstabloid.com   This information is not intended to  replace advice given to you by your health care provider. Make sure you discuss any questions you have with your health care provider.   Document Released: 06/11/2011 Document Revised: 07/13/2014 Document Reviewed: 04/26/2013 Elsevier Interactive Patient Education 2016 Elsevier Inc.  Hydrochlorothiazide, HCTZ capsules or tablets What is this medicine? HYDROCHLOROTHIAZIDE (hye droe klor oh THYE a zide) is a diuretic. It increases the amount of urine passed, which causes the body to lose salt and water. This medicine is used to treat high blood pressure. It is also reduces the swelling and water retention caused by various medical conditions, such as heart, liver, or kidney disease. This medicine may be used for other purposes; ask your health care provider or pharmacist if you have questions. What should I  tell my health care provider before I take this medicine? They need to know if you have any of these conditions: -diabetes -gout -immune system problems, like lupus -kidney disease or kidney stones -liver disease -pancreatitis -small amount of urine or difficulty passing urine -an unusual or allergic reaction to hydrochlorothiazide, sulfa drugs, other medicines, foods, dyes, or preservatives -pregnant or trying to get pregnant -breast-feeding How should I use this medicine? Take this medicine by mouth with a glass of water. Follow the directions on the prescription label. Take your medicine at regular intervals. Remember that you will need to pass urine frequently after taking this medicine. Do not take your doses at a time of day that will cause you problems. Do not stop taking your medicine unless your doctor tells you to. Talk to your pediatrician regarding the use of this medicine in children. Special care may be needed. Overdosage: If you think you have taken too much of this medicine contact a poison control center or emergency room at once. NOTE: This medicine is only for you. Do  not share this medicine with others. What if I miss a dose? If you miss a dose, take it as soon as you can. If it is almost time for your next dose, take only that dose. Do not take double or extra doses. What may interact with this medicine? -cholestyramine -colestipol -digoxin -dofetilide -lithium -medicines for blood pressure -medicines for diabetes -medicines that relax muscles for surgery -other diuretics -steroid medicines like prednisone or cortisone This list may not describe all possible interactions. Give your health care provider a list of all the medicines, herbs, non-prescription drugs, or dietary supplements you use. Also tell them if you smoke, drink alcohol, or use illegal drugs. Some items may interact with your medicine. What should I watch for while using this medicine? Visit your doctor or health care professional for regular checks on your progress. Check your blood pressure as directed. Ask your doctor or health care professional what your blood pressure should be and when you should contact him or her. You may need to be on a special diet while taking this medicine. Ask your doctor. Check with your doctor or health care professional if you get an attack of severe diarrhea, nausea and vomiting, or if you sweat a lot. The loss of too much body fluid can make it dangerous for you to take this medicine. You may get drowsy or dizzy. Do not drive, use machinery, or do anything that needs mental alertness until you know how this medicine affects you. Do not stand or sit up quickly, especially if you are an older patient. This reduces the risk of dizzy or fainting spells. Alcohol may interfere with the effect of this medicine. Avoid alcoholic drinks. This medicine may affect your blood sugar level. If you have diabetes, check with your doctor or health care professional before changing the dose of your diabetic medicine. This medicine can make you more sensitive to the sun. Keep  out of the sun. If you cannot avoid being in the sun, wear protective clothing and use sunscreen. Do not use sun lamps or tanning beds/booths. What side effects may I notice from receiving this medicine? Side effects that you should report to your doctor or health care professional as soon as possible: -allergic reactions such as skin rash or itching, hives, swelling of the lips, mouth, tongue, or throat -changes in vision -chest pain -eye pain -fast or irregular heartbeat -feeling faint or lightheaded, falls -gout  attack -muscle pain or cramps -pain or difficulty when passing urine -pain, tingling, numbness in the hands or feet -redness, blistering, peeling or loosening of the skin, including inside the mouth -unusually weak or tired Side effects that usually do not require medical attention (report to your doctor or health care professional if they continue or are bothersome): -change in sex drive or performance -dry mouth -headache -stomach upset This list may not describe all possible side effects. Call your doctor for medical advice about side effects. You may report side effects to FDA at 1-800-FDA-1088. Where should I keep my medicine? Keep out of the reach of children. Store at room temperature between 15 and 30 degrees C (59 and 86 degrees F). Do not freeze. Protect from light and moisture. Keep container closed tightly. Throw away any unused medicine after the expiration date. NOTE: This sheet is a summary. It may not cover all possible information. If you have questions about this medicine, talk to your doctor, pharmacist, or health care provider.    2016, Elsevier/[employer] Standard. (2010-02-14 12:57:37)

## 2015-08-30 DIAGNOSIS — I10 Essential (primary) hypertension: Secondary | ICD-10-CM | POA: Diagnosis not present

## 2015-08-30 DIAGNOSIS — E782 Mixed hyperlipidemia: Secondary | ICD-10-CM | POA: Diagnosis not present

## 2015-08-30 DIAGNOSIS — F411 Generalized anxiety disorder: Secondary | ICD-10-CM | POA: Diagnosis not present

## 2015-08-30 DIAGNOSIS — E039 Hypothyroidism, unspecified: Secondary | ICD-10-CM | POA: Diagnosis not present

## 2015-09-03 DIAGNOSIS — I10 Essential (primary) hypertension: Secondary | ICD-10-CM | POA: Diagnosis not present

## 2015-09-03 DIAGNOSIS — G4733 Obstructive sleep apnea (adult) (pediatric): Secondary | ICD-10-CM | POA: Diagnosis not present

## 2015-09-03 DIAGNOSIS — E119 Type 2 diabetes mellitus without complications: Secondary | ICD-10-CM | POA: Diagnosis not present

## 2015-09-03 DIAGNOSIS — R42 Dizziness and giddiness: Secondary | ICD-10-CM | POA: Diagnosis not present

## 2015-09-18 DIAGNOSIS — R945 Abnormal results of liver function studies: Secondary | ICD-10-CM | POA: Diagnosis not present

## 2015-10-03 DIAGNOSIS — E782 Mixed hyperlipidemia: Secondary | ICD-10-CM | POA: Diagnosis not present

## 2015-10-03 DIAGNOSIS — I10 Essential (primary) hypertension: Secondary | ICD-10-CM | POA: Diagnosis not present

## 2015-10-03 DIAGNOSIS — M810 Age-related osteoporosis without current pathological fracture: Secondary | ICD-10-CM | POA: Diagnosis not present

## 2015-10-03 DIAGNOSIS — G4733 Obstructive sleep apnea (adult) (pediatric): Secondary | ICD-10-CM | POA: Diagnosis not present

## 2015-10-03 DIAGNOSIS — R42 Dizziness and giddiness: Secondary | ICD-10-CM | POA: Diagnosis not present

## 2015-10-03 DIAGNOSIS — E039 Hypothyroidism, unspecified: Secondary | ICD-10-CM | POA: Diagnosis not present

## 2015-10-03 DIAGNOSIS — E119 Type 2 diabetes mellitus without complications: Secondary | ICD-10-CM | POA: Diagnosis not present

## 2015-10-03 DIAGNOSIS — R945 Abnormal results of liver function studies: Secondary | ICD-10-CM | POA: Diagnosis not present

## 2015-10-03 DIAGNOSIS — D509 Iron deficiency anemia, unspecified: Secondary | ICD-10-CM | POA: Diagnosis not present

## 2015-10-10 DIAGNOSIS — E2839 Other primary ovarian failure: Secondary | ICD-10-CM | POA: Diagnosis not present

## 2015-10-10 DIAGNOSIS — M8588 Other specified disorders of bone density and structure, other site: Secondary | ICD-10-CM | POA: Diagnosis not present

## 2015-10-10 DIAGNOSIS — M85852 Other specified disorders of bone density and structure, left thigh: Secondary | ICD-10-CM | POA: Diagnosis not present

## 2015-10-10 DIAGNOSIS — M85862 Other specified disorders of bone density and structure, left lower leg: Secondary | ICD-10-CM | POA: Diagnosis not present

## 2015-10-21 DIAGNOSIS — J301 Allergic rhinitis due to pollen: Secondary | ICD-10-CM | POA: Diagnosis not present

## 2015-10-21 DIAGNOSIS — J0101 Acute recurrent maxillary sinusitis: Secondary | ICD-10-CM | POA: Diagnosis not present

## 2015-10-22 ENCOUNTER — Encounter: Payer: Self-pay | Admitting: Internal Medicine

## 2015-11-03 DIAGNOSIS — R42 Dizziness and giddiness: Secondary | ICD-10-CM | POA: Diagnosis not present

## 2015-11-03 DIAGNOSIS — E119 Type 2 diabetes mellitus without complications: Secondary | ICD-10-CM | POA: Diagnosis not present

## 2015-11-03 DIAGNOSIS — I10 Essential (primary) hypertension: Secondary | ICD-10-CM | POA: Diagnosis not present

## 2015-11-03 DIAGNOSIS — G4733 Obstructive sleep apnea (adult) (pediatric): Secondary | ICD-10-CM | POA: Diagnosis not present

## 2015-11-27 DIAGNOSIS — Z1231 Encounter for screening mammogram for malignant neoplasm of breast: Secondary | ICD-10-CM | POA: Diagnosis not present

## 2015-11-27 DIAGNOSIS — Z803 Family history of malignant neoplasm of breast: Secondary | ICD-10-CM | POA: Diagnosis not present

## 2015-12-03 DIAGNOSIS — E119 Type 2 diabetes mellitus without complications: Secondary | ICD-10-CM | POA: Diagnosis not present

## 2015-12-03 DIAGNOSIS — G4733 Obstructive sleep apnea (adult) (pediatric): Secondary | ICD-10-CM | POA: Diagnosis not present

## 2015-12-03 DIAGNOSIS — I10 Essential (primary) hypertension: Secondary | ICD-10-CM | POA: Diagnosis not present

## 2015-12-03 DIAGNOSIS — R42 Dizziness and giddiness: Secondary | ICD-10-CM | POA: Diagnosis not present

## 2016-01-03 DIAGNOSIS — R42 Dizziness and giddiness: Secondary | ICD-10-CM | POA: Diagnosis not present

## 2016-01-03 DIAGNOSIS — G4733 Obstructive sleep apnea (adult) (pediatric): Secondary | ICD-10-CM | POA: Diagnosis not present

## 2016-01-03 DIAGNOSIS — I10 Essential (primary) hypertension: Secondary | ICD-10-CM | POA: Diagnosis not present

## 2016-01-03 DIAGNOSIS — E119 Type 2 diabetes mellitus without complications: Secondary | ICD-10-CM | POA: Diagnosis not present

## 2016-01-20 DIAGNOSIS — I10 Essential (primary) hypertension: Secondary | ICD-10-CM | POA: Diagnosis not present

## 2016-01-20 DIAGNOSIS — R197 Diarrhea, unspecified: Secondary | ICD-10-CM | POA: Diagnosis not present

## 2016-01-20 DIAGNOSIS — E039 Hypothyroidism, unspecified: Secondary | ICD-10-CM | POA: Diagnosis not present

## 2016-01-20 DIAGNOSIS — A09 Infectious gastroenteritis and colitis, unspecified: Secondary | ICD-10-CM | POA: Diagnosis not present

## 2016-01-23 DIAGNOSIS — R1084 Generalized abdominal pain: Secondary | ICD-10-CM | POA: Diagnosis not present

## 2016-01-23 DIAGNOSIS — E860 Dehydration: Secondary | ICD-10-CM | POA: Diagnosis not present

## 2016-01-27 ENCOUNTER — Other Ambulatory Visit: Payer: Self-pay | Admitting: Nurse Practitioner

## 2016-01-27 DIAGNOSIS — R197 Diarrhea, unspecified: Secondary | ICD-10-CM

## 2016-01-30 DIAGNOSIS — N390 Urinary tract infection, site not specified: Secondary | ICD-10-CM | POA: Diagnosis not present

## 2016-01-30 DIAGNOSIS — I10 Essential (primary) hypertension: Secondary | ICD-10-CM | POA: Diagnosis not present

## 2016-01-30 DIAGNOSIS — D72819 Decreased white blood cell count, unspecified: Secondary | ICD-10-CM | POA: Diagnosis not present

## 2016-01-30 DIAGNOSIS — E039 Hypothyroidism, unspecified: Secondary | ICD-10-CM | POA: Diagnosis not present

## 2016-01-30 DIAGNOSIS — K5732 Diverticulitis of large intestine without perforation or abscess without bleeding: Secondary | ICD-10-CM | POA: Diagnosis not present

## 2016-01-31 ENCOUNTER — Other Ambulatory Visit
Admission: RE | Admit: 2016-01-31 | Discharge: 2016-01-31 | Disposition: A | Discharge status: Home / Self Care | Payer: PPO | Source: Ambulatory Visit | Attending: Nurse Practitioner | Admitting: Nurse Practitioner

## 2016-01-31 ENCOUNTER — Ambulatory Visit
Admission: RE | Admit: 2016-01-31 | Discharge: 2016-01-31 | Disposition: A | Discharge status: Home / Self Care | Payer: PPO | Source: Ambulatory Visit | Attending: Nurse Practitioner | Admitting: Nurse Practitioner

## 2016-01-31 DIAGNOSIS — R109 Unspecified abdominal pain: Secondary | ICD-10-CM | POA: Diagnosis not present

## 2016-01-31 DIAGNOSIS — Z01812 Encounter for preprocedural laboratory examination: Secondary | ICD-10-CM | POA: Insufficient documentation

## 2016-01-31 DIAGNOSIS — R197 Diarrhea, unspecified: Secondary | ICD-10-CM | POA: Insufficient documentation

## 2016-01-31 LAB — CREATININE, SERUM
CREATININE: 0.82 mg/dL (ref 0.44–1.00)
GFR calc non Af Amer: >60 mL/min (ref >60)

## 2016-01-31 MED ORDER — IOPAMIDOL (ISOVUE-370) INJECTION 76%
125.0000 mL | Freq: Once | INTRAVENOUS | Status: AC | PRN
  Administered 2016-01-31: 125 mL via INTRAVENOUS

## 2016-02-12 DIAGNOSIS — D72819 Decreased white blood cell count, unspecified: Secondary | ICD-10-CM | POA: Diagnosis not present

## 2016-02-17 DIAGNOSIS — K5732 Diverticulitis of large intestine without perforation or abscess without bleeding: Secondary | ICD-10-CM | POA: Diagnosis not present

## 2016-02-17 DIAGNOSIS — D72819 Decreased white blood cell count, unspecified: Secondary | ICD-10-CM | POA: Diagnosis not present

## 2016-02-17 DIAGNOSIS — I10 Essential (primary) hypertension: Secondary | ICD-10-CM | POA: Diagnosis not present

## 2016-02-17 DIAGNOSIS — N390 Urinary tract infection, site not specified: Secondary | ICD-10-CM | POA: Diagnosis not present

## 2016-04-08 DIAGNOSIS — E559 Vitamin D deficiency, unspecified: Secondary | ICD-10-CM | POA: Diagnosis not present

## 2016-04-08 DIAGNOSIS — I10 Essential (primary) hypertension: Secondary | ICD-10-CM | POA: Diagnosis not present

## 2016-04-08 DIAGNOSIS — E039 Hypothyroidism, unspecified: Secondary | ICD-10-CM | POA: Diagnosis not present

## 2016-04-08 DIAGNOSIS — Z0001 Encounter for general adult medical examination with abnormal findings: Secondary | ICD-10-CM | POA: Diagnosis not present

## 2016-04-14 ENCOUNTER — Encounter: Payer: Self-pay | Admitting: Gastroenterology

## 2016-04-16 DIAGNOSIS — Z0001 Encounter for general adult medical examination with abnormal findings: Secondary | ICD-10-CM | POA: Diagnosis not present

## 2016-04-16 DIAGNOSIS — A09 Infectious gastroenteritis and colitis, unspecified: Secondary | ICD-10-CM | POA: Diagnosis not present

## 2016-04-16 DIAGNOSIS — E782 Mixed hyperlipidemia: Secondary | ICD-10-CM | POA: Diagnosis not present

## 2016-04-16 DIAGNOSIS — I10 Essential (primary) hypertension: Secondary | ICD-10-CM | POA: Diagnosis not present

## 2016-04-16 DIAGNOSIS — E039 Hypothyroidism, unspecified: Secondary | ICD-10-CM | POA: Diagnosis not present

## 2016-04-16 DIAGNOSIS — Z23 Encounter for immunization: Secondary | ICD-10-CM | POA: Diagnosis not present

## 2016-04-20 ENCOUNTER — Encounter: Payer: Self-pay | Admitting: Gastroenterology

## 2016-05-13 ENCOUNTER — Ambulatory Visit (AMBULATORY_SURGERY_CENTER): Payer: Self-pay | Admitting: *Deleted

## 2016-05-13 VITALS — Ht 66.0 in | Wt 157.0 lb

## 2016-05-13 DIAGNOSIS — Z86010 Personal history of colonic polyps: Secondary | ICD-10-CM

## 2016-05-13 MED ORDER — NA SULFATE-K SULFATE-MG SULF 17.5-3.13-1.6 GM/177ML PO SOLN: 1.0000 | Freq: Once | ORAL | 0 refills | Status: AC

## 2016-05-13 NOTE — Progress Notes (Signed)
No egg or soy allergy known to patient   issues with past sedation with any surgeries  or procedures of post op n/v--, no intubation problems  No diet pills per patient No home 02 use per patient  No blood thinners per patient  Pt states  issues with constipation- some occ and has to take stool softener daily- firm soft stools  No A fib or A flutter

## 2016-05-15 ENCOUNTER — Encounter: Payer: Self-pay | Admitting: Gastroenterology

## 2016-05-25 ENCOUNTER — Ambulatory Visit (AMBULATORY_SURGERY_CENTER): Payer: PPO | Admitting: Gastroenterology

## 2016-05-25 ENCOUNTER — Encounter: Payer: Self-pay | Admitting: Gastroenterology

## 2016-05-25 VITALS — BP 133/66 | HR 64 | Temp 98.2°F | Resp 16 | Ht 66.0 in | Wt 157.0 lb

## 2016-05-25 DIAGNOSIS — D123 Benign neoplasm of transverse colon: Secondary | ICD-10-CM

## 2016-05-25 DIAGNOSIS — Z800 Family history of malignant neoplasm of digestive organs: Secondary | ICD-10-CM | POA: Diagnosis not present

## 2016-05-25 DIAGNOSIS — Z86010 Personal history of colonic polyps: Secondary | ICD-10-CM | POA: Diagnosis not present

## 2016-05-25 DIAGNOSIS — K219 Gastro-esophageal reflux disease without esophagitis: Secondary | ICD-10-CM | POA: Diagnosis not present

## 2016-05-25 DIAGNOSIS — K621 Rectal polyp: Secondary | ICD-10-CM

## 2016-05-25 DIAGNOSIS — D129 Benign neoplasm of anus and anal canal: Secondary | ICD-10-CM

## 2016-05-25 DIAGNOSIS — D128 Benign neoplasm of rectum: Secondary | ICD-10-CM

## 2016-05-25 DIAGNOSIS — I10 Essential (primary) hypertension: Secondary | ICD-10-CM | POA: Diagnosis not present

## 2016-05-25 HISTORY — PX: COLONOSCOPY: SHX174

## 2016-05-25 MED ORDER — SODIUM CHLORIDE 0.9 % IV SOLN: 500.0000 mL | INTRAVENOUS | Status: DC

## 2016-05-25 NOTE — Patient Instructions (Signed)
Impression/Recommendations:  Polyp handout given to patient. Diverticulosis handout given to patient  Repeat colonoscopy in 5 years for surveillance.  YOU HAD AN ENDOSCOPIC PROCEDURE TODAY AT [facility]:   Refer to the procedure report that was given to you for any specific questions about what was found during the examination.  If the procedure report does not answer your questions, please call your gastroenterologist to clarify.  If you requested that your care partner not be given the details of your procedure findings, then the procedure report has been included in a sealed envelope for you to review at your convenience later.  YOU SHOULD EXPECT: Some feelings of bloating in the abdomen. Passage of more gas than usual.  Walking can help get rid of the air that was put into your GI tract during the procedure and reduce the bloating. If you had a lower endoscopy (such as a colonoscopy or flexible sigmoidoscopy) you may notice spotting of blood in your stool or on the toilet paper. If you underwent a bowel prep for your procedure, you may not have a normal bowel movement for a few days.  Please Note:  You might notice some irritation and congestion in your nose or some drainage.  This is from the oxygen used during your procedure.  There is no need for concern and it should clear up in a day or so.  SYMPTOMS TO REPORT IMMEDIATELY:   Following lower endoscopy (colonoscopy or flexible sigmoidoscopy):  Excessive amounts of blood in the stool  Significant tenderness or worsening of abdominal pains  Swelling of the abdomen that is new, acute  Fever of 100F or higher For urgent or emergent issues, a gastroenterologist can be reached at any hour by calling [phone].   DIET:  We do recommend a small meal at first, but then you may proceed to your regular diet.  Drink plenty of fluids but you should avoid alcoholic beverages for 24 hours.  ACTIVITY:  You should plan to take  it easy for the rest of today and you should NOT DRIVE or use heavy machinery until tomorrow (because of the sedation medicines used during the test).    FOLLOW UP: Our staff will call the number listed on your records the next business day following your procedure to check on you and address any questions or concerns that you may have regarding the information given to you following your procedure. If we do not reach you, we will leave a message.  However, if you are feeling well and you are not experiencing any problems, there is no need to return our call.  We will assume that you have returned to your regular daily activities without incident.  If any biopsies were taken you will be contacted by phone or by letter within the next 1-3 weeks.  Please call us at [phone] if you have not heard about the biopsies in 3 weeks.    SIGNATURES/CONFIDENTIALITY: You and/or your care partner have signed paperwork which will be entered into your electronic medical record.  These signatures attest to the fact that that the information above on your After Visit Summary has been reviewed and is understood.  Full responsibility of the confidentiality of this discharge information lies with you and/or your care-partner.

## 2016-05-25 NOTE — Progress Notes (Signed)
Report to PACU, RN, vss, BBS= Clear.  

## 2016-05-25 NOTE — Progress Notes (Signed)
Called to room to assist during endoscopic procedure.  Patient ID and intended procedure confirmed with present staff. Received instructions for my participation in the procedure from the performing physician.  

## 2016-05-25 NOTE — Op Note (Signed)
[facility] Patient Name: Toni [name] Procedure Date: 05/25/2016 2:52 PM MRN: [id number] Endoscopist: Ladene Artist , MD Age: 70 Referring MD:  Date of Birth: 01-Nov-1945 Gender: Female Account #: [id number] Procedure:                Colonoscopy Indications:              Colon cancer screening in patient at increased                            risk: Family history of colorectal cancer in                            multiple 2nd degree relatives, Surveillance:                            Personal history of adenomatous polyps on last                            colonoscopy > 3 years ago Medicines:                Monitored Anesthesia Care Procedure:                Pre-Anesthesia Assessment:                           - Prior to the procedure, a History and Physical                            was performed, and patient medications and                            allergies were reviewed. The patient's tolerance of                            previous anesthesia was also reviewed. The risks                            and benefits of the procedure and the sedation                            options and risks were discussed with the patient.                            All questions were answered, and informed consent                            was obtained. Prior Anticoagulants: The patient has                            taken no previous anticoagulant or antiplatelet                            agents. ASA Grade Assessment: II - A patient with  mild systemic disease. After reviewing the risks                            and benefits, the patient was deemed in                            satisfactory condition to undergo the procedure.                           After obtaining informed consent, the colonoscope                            was passed under direct vision. Throughout the                            procedure, the patient's blood pressure, pulse, and                             oxygen saturations were monitored continuously. The                            Model PCF-H190L [id number]) scope was introduced                            through the anus and advanced to the the cecum,                            identified by appendiceal orifice and ileocecal                            valve. The ileocecal valve, appendiceal orifice,                            and rectum were photographed. The quality of the                            bowel preparation was good. The colonoscopy was                            performed without difficulty. The patient tolerated                            the procedure well. Scope In: 2:58:37 PM Scope Out: 3:15:38 PM Scope Withdrawal Time: 0 hours 12 minutes 49 seconds  Total Procedure Duration: 0 hours 17 minutes 1 second  Findings:                 The perianal and digital rectal examinations were                            normal.                           A 6 mm polyp was found in the transverse colon. The  polyp was sessile. The polyp was removed with a                            cold snare. Resection and retrieval were complete.                           Two sessile polyps were found in the rectum and                            transverse colon. The polyps were 4 to 5 mm in                            size. These polyps were removed with a cold biopsy                            forceps. Resection and retrieval were complete.                           A few medium-mouthed diverticula were found in the                            sigmoid colon and transverse colon.                           The exam was otherwise without abnormality on                            direct and retroflexion views. Complications:            No immediate complications. Estimated blood loss:                            None. Estimated Blood Loss:     Estimated blood loss: none. Impression:               - One  6 mm polyp in the transverse colon, removed                            with a cold snare. Resected and retrieved.                           - Two 4 to 5 mm polyps in the rectum and in the                            transverse colon, removed with a cold biopsy                            forceps. Resected and retrieved.                           - Diverticulosis in the sigmoid colon and in the                            transverse colon.                           -  The examination was otherwise normal on direct                            and retroflexion views. Recommendation:           - Repeat colonoscopy in 5 years for surveillance.                           - Patient has a contact number available for                            emergencies. The signs and symptoms of potential                            delayed complications were discussed with the                            patient. Return to normal activities tomorrow.                            Written discharge instructions were provided to the                            patient.                           - Resume previous diet.                           - Continue present medications.                           - Await pathology results. Ladene Artist, MD 05/25/2016 3:19:18 PM This report has been signed electronically.

## 2016-05-26 ENCOUNTER — Telehealth: Payer: Self-pay

## 2016-05-26 NOTE — Telephone Encounter (Signed)
  Follow up Call-  Call back number 05/25/2016  Post procedure Call Back phone  # [phone]  Permission to leave phone message Yes  Some recent data might be hidden     Patient questions:  Do you have a fever, pain , or abdominal swelling? No. Pain Score  0 *  Have you tolerated food without any problems? Yes.    Have you been able to return to your normal activities? Yes.    Do you have any questions about your discharge instructions: Diet   No. Medications  No. Follow up visit  No.  Do you have questions or concerns about your Care? No.  Actions: * If pain score is 4 or above: No action needed, pain <4.

## 2016-06-04 ENCOUNTER — Encounter: Payer: Self-pay | Admitting: Gastroenterology

## 2016-06-19 DIAGNOSIS — J309 Allergic rhinitis, unspecified: Secondary | ICD-10-CM | POA: Diagnosis not present

## 2016-06-19 DIAGNOSIS — J0101 Acute recurrent maxillary sinusitis: Secondary | ICD-10-CM | POA: Diagnosis not present

## 2016-09-15 DIAGNOSIS — H52203 Unspecified astigmatism, bilateral: Secondary | ICD-10-CM | POA: Diagnosis not present

## 2016-09-15 DIAGNOSIS — H524 Presbyopia: Secondary | ICD-10-CM | POA: Diagnosis not present

## 2016-09-15 DIAGNOSIS — H5203 Hypermetropia, bilateral: Secondary | ICD-10-CM | POA: Diagnosis not present

## 2016-09-15 DIAGNOSIS — H2513 Age-related nuclear cataract, bilateral: Secondary | ICD-10-CM | POA: Diagnosis not present

## 2016-09-29 DIAGNOSIS — E039 Hypothyroidism, unspecified: Secondary | ICD-10-CM | POA: Diagnosis not present

## 2016-09-29 DIAGNOSIS — E782 Mixed hyperlipidemia: Secondary | ICD-10-CM | POA: Diagnosis not present

## 2016-09-29 DIAGNOSIS — J019 Acute sinusitis, unspecified: Secondary | ICD-10-CM | POA: Diagnosis not present

## 2016-09-29 DIAGNOSIS — J029 Acute pharyngitis, unspecified: Secondary | ICD-10-CM | POA: Diagnosis not present

## 2016-09-29 DIAGNOSIS — I10 Essential (primary) hypertension: Secondary | ICD-10-CM | POA: Diagnosis not present

## 2016-10-27 DIAGNOSIS — I10 Essential (primary) hypertension: Secondary | ICD-10-CM | POA: Diagnosis not present

## 2016-10-27 DIAGNOSIS — E039 Hypothyroidism, unspecified: Secondary | ICD-10-CM | POA: Diagnosis not present

## 2016-10-27 DIAGNOSIS — E782 Mixed hyperlipidemia: Secondary | ICD-10-CM | POA: Diagnosis not present

## 2016-12-24 DIAGNOSIS — Z1231 Encounter for screening mammogram for malignant neoplasm of breast: Secondary | ICD-10-CM | POA: Diagnosis not present

## 2017-04-29 DIAGNOSIS — Z0001 Encounter for general adult medical examination with abnormal findings: Secondary | ICD-10-CM | POA: Diagnosis not present

## 2017-04-29 DIAGNOSIS — E039 Hypothyroidism, unspecified: Secondary | ICD-10-CM | POA: Diagnosis not present

## 2017-04-29 DIAGNOSIS — I10 Essential (primary) hypertension: Secondary | ICD-10-CM | POA: Diagnosis not present

## 2017-04-29 DIAGNOSIS — M67441 Ganglion, right hand: Secondary | ICD-10-CM | POA: Diagnosis not present

## 2017-04-29 DIAGNOSIS — F5102 Adjustment insomnia: Secondary | ICD-10-CM | POA: Diagnosis not present

## 2017-04-29 DIAGNOSIS — E782 Mixed hyperlipidemia: Secondary | ICD-10-CM | POA: Diagnosis not present

## 2017-05-03 DIAGNOSIS — Z0001 Encounter for general adult medical examination with abnormal findings: Secondary | ICD-10-CM | POA: Diagnosis not present

## 2017-05-03 DIAGNOSIS — E559 Vitamin D deficiency, unspecified: Secondary | ICD-10-CM | POA: Diagnosis not present

## 2017-05-03 DIAGNOSIS — E039 Hypothyroidism, unspecified: Secondary | ICD-10-CM | POA: Diagnosis not present

## 2017-05-03 DIAGNOSIS — E782 Mixed hyperlipidemia: Secondary | ICD-10-CM | POA: Diagnosis not present

## 2017-05-17 DIAGNOSIS — M720 Palmar fascial fibromatosis [Dupuytren]: Secondary | ICD-10-CM | POA: Diagnosis not present

## 2017-06-10 DIAGNOSIS — I10 Essential (primary) hypertension: Secondary | ICD-10-CM | POA: Diagnosis not present

## 2017-06-10 DIAGNOSIS — J019 Acute sinusitis, unspecified: Secondary | ICD-10-CM | POA: Diagnosis not present

## 2017-06-10 DIAGNOSIS — E039 Hypothyroidism, unspecified: Secondary | ICD-10-CM | POA: Diagnosis not present

## 2017-09-16 ENCOUNTER — Other Ambulatory Visit: Payer: Self-pay | Admitting: Internal Medicine

## 2017-09-24 DIAGNOSIS — H25013 Cortical age-related cataract, bilateral: Secondary | ICD-10-CM | POA: Diagnosis not present

## 2017-09-24 DIAGNOSIS — H52203 Unspecified astigmatism, bilateral: Secondary | ICD-10-CM | POA: Diagnosis not present

## 2017-09-24 DIAGNOSIS — H524 Presbyopia: Secondary | ICD-10-CM | POA: Diagnosis not present

## 2017-09-24 DIAGNOSIS — H5203 Hypermetropia, bilateral: Secondary | ICD-10-CM | POA: Diagnosis not present

## 2017-09-24 DIAGNOSIS — H2513 Age-related nuclear cataract, bilateral: Secondary | ICD-10-CM | POA: Diagnosis not present

## 2017-09-29 DIAGNOSIS — H25013 Cortical age-related cataract, bilateral: Secondary | ICD-10-CM | POA: Diagnosis not present

## 2017-09-29 DIAGNOSIS — H2513 Age-related nuclear cataract, bilateral: Secondary | ICD-10-CM | POA: Diagnosis not present

## 2017-10-13 ENCOUNTER — Telehealth: Payer: Self-pay

## 2017-10-13 ENCOUNTER — Other Ambulatory Visit: Payer: Self-pay

## 2017-10-13 MED ORDER — PREDNISONE 10 MG (21) PO TBPK: ORAL_TABLET | Freq: Every day | ORAL | 0 refills | Status: DC

## 2017-10-13 NOTE — Telephone Encounter (Signed)
Pt called in c/o having contact dermatitis and thinks possibly its poison oak where she was putting mulch in her yard.  She has itching and some blisters and thinks she is spreading it from scratching when she sleeps.  Spoke to [employer] and approved to send in pred taper for 6 days.  dbs

## 2017-10-20 DIAGNOSIS — H2511 Age-related nuclear cataract, right eye: Secondary | ICD-10-CM | POA: Diagnosis not present

## 2017-10-20 DIAGNOSIS — H25011 Cortical age-related cataract, right eye: Secondary | ICD-10-CM | POA: Diagnosis not present

## 2017-10-20 DIAGNOSIS — H25012 Cortical age-related cataract, left eye: Secondary | ICD-10-CM | POA: Diagnosis not present

## 2017-10-20 DIAGNOSIS — H2512 Age-related nuclear cataract, left eye: Secondary | ICD-10-CM | POA: Diagnosis not present

## 2017-10-20 HISTORY — PX: CATARACT EXTRACTION, BILATERAL: SHX1313

## 2017-10-27 DIAGNOSIS — H2511 Age-related nuclear cataract, right eye: Secondary | ICD-10-CM | POA: Diagnosis not present

## 2017-10-27 DIAGNOSIS — H25011 Cortical age-related cataract, right eye: Secondary | ICD-10-CM | POA: Diagnosis not present

## 2017-10-27 HISTORY — PX: CATARACT EXTRACTION, BILATERAL: SHX1313

## 2017-11-05 ENCOUNTER — Other Ambulatory Visit: Payer: Self-pay | Admitting: Internal Medicine

## 2017-11-22 DIAGNOSIS — Z961 Presence of intraocular lens: Secondary | ICD-10-CM | POA: Diagnosis not present

## 2017-12-30 DIAGNOSIS — Z1231 Encounter for screening mammogram for malignant neoplasm of breast: Secondary | ICD-10-CM | POA: Diagnosis not present

## 2018-01-12 ENCOUNTER — Other Ambulatory Visit: Payer: Self-pay | Admitting: Internal Medicine

## 2018-01-13 ENCOUNTER — Ambulatory Visit (INDEPENDENT_AMBULATORY_CARE_PROVIDER_SITE_OTHER): Payer: PPO | Admitting: Nurse Practitioner

## 2018-01-13 ENCOUNTER — Encounter (INDEPENDENT_AMBULATORY_CARE_PROVIDER_SITE_OTHER): Payer: Self-pay

## 2018-01-13 ENCOUNTER — Encounter: Payer: Self-pay | Admitting: Nurse Practitioner

## 2018-01-13 VITALS — BP 152/74 | HR 84 | Resp 16 | Ht 66.0 in | Wt 169.4 lb

## 2018-01-13 DIAGNOSIS — R319 Hematuria, unspecified: Secondary | ICD-10-CM | POA: Diagnosis not present

## 2018-01-13 DIAGNOSIS — E890 Postprocedural hypothyroidism: Secondary | ICD-10-CM

## 2018-01-13 DIAGNOSIS — R300 Dysuria: Secondary | ICD-10-CM

## 2018-01-13 DIAGNOSIS — I10 Essential (primary) hypertension: Secondary | ICD-10-CM

## 2018-01-13 DIAGNOSIS — N390 Urinary tract infection, site not specified: Secondary | ICD-10-CM

## 2018-01-13 DIAGNOSIS — J019 Acute sinusitis, unspecified: Secondary | ICD-10-CM | POA: Diagnosis not present

## 2018-01-13 LAB — POCT URINALYSIS DIPSTICK
BILIRUBIN UA: NEGATIVE
GLUCOSE UA: NEGATIVE
Ketones, UA: NEGATIVE
Leukocytes, UA: NEGATIVE
Nitrite, UA: NEGATIVE
PH UA: 6 (ref 5.0–8.0)
Protein, UA: NEGATIVE
Spec Grav, UA: 1.015 (ref 1.010–1.025)
Urobilinogen, UA: 0.2 E.U./dL

## 2018-01-13 MED ORDER — LEVOFLOXACIN 500 MG PO TABS: 500.0000 mg | ORAL_TABLET | Freq: Every day | ORAL | 0 refills | Status: DC

## 2018-01-13 MED ORDER — PREDNISONE 10 MG (21) PO TBPK: ORAL_TABLET | ORAL | 0 refills | Status: DC

## 2018-01-13 NOTE — Progress Notes (Signed)
[facility] [place], [place] [address]  Internal MEDICINE  Office Visit Note  Patient Name: Toni [name]  [id number]  [id number]  Date of Service: 01/14/2018   Pt is here for a sick visit.  Chief Complaint  Patient presents with  . Urinary Tract Infection    possible UTI or pulled something in lower back, been going on since Monday night  . Sinusitis     Urinary Tract Infection   This is a new problem. The current episode started in the past 7 days. The problem has been gradually worsening. The quality of the pain is described as aching and stabbing. The pain is at a severity of 4/10. The pain is moderate. There has been no fever. Associated symptoms include chills, flank pain, hematuria and nausea. Pertinent negatives include no vomiting. She has tried nothing for the symptoms.  Sinusitis  This is a new problem. The current episode started yesterday. The problem is unchanged. There has been no fever. Associated symptoms include chills, congestion, coughing, ear pain, headaches, a hoarse voice, sinus pressure and a sore throat. Past treatments include nothing.        Current Medication:  Outpatient Encounter Medications as of 01/13/2018  Medication Sig  . aspirin 81 MG tablet Take 81 mg by mouth daily.  . Calcium Carbonate-Vitamin D (CALCIUM + D PO) Take 1 tablet by mouth daily.   . cetirizine (ZYRTEC) 10 MG tablet Take 10 mg by mouth as needed.   . cholecalciferol (VITAMIN D) 1000 units tablet Take 1,000 Units by mouth daily.  Mariane Baumgarten Calcium (STOOL SOFTENER PO) Take 1 tablet by mouth daily.  . fenofibrate 54 MG tablet TAKE 1 TABLET BY MOUTH EVERY DAY FOR ELEVATED CHOLESTEROL  . hydrochlorothiazide (HYDRODIURIL) 12.5 MG tablet Take 1 tablet (12.5 mg total) by mouth daily.  . hydrochlorothiazide (MICROZIDE) 12.5 MG capsule TAKE 1 CAPSULE BY MOUTH EVERY DAY  . pantoprazole (PROTONIX) 40 MG tablet Take 40 mg by mouth daily.  Marland Kitchen SYNTHROID 112  MCG tablet TAKE 1 TABLET BY MOUTH DAILY . ON SUNDAYS TAKE 1/2 TABLET  . valsartan (DIOVAN) 160 MG tablet Take 160 mg by mouth daily.  Marland Kitchen levofloxacin (LEVAQUIN) 500 MG tablet Take 1 tablet (500 mg total) by mouth daily.  . predniSONE (STERAPRED UNI-PAK 21 TAB) 10 MG (21) TBPK tablet 6 day taper - take by mouth as directed for 6 days  . [DISCONTINUED] hyoscyamine (LEVSIN SL) 0.125 MG SL tablet Place 1-2 tablets (0.125-0.25 mg total) under the tongue every 4 (four) hours as needed. (Patient not taking: Reported on 02/20/2015)  . [DISCONTINUED] predniSONE (STERAPRED UNI-PAK 21 TAB) 10 MG (21) TBPK tablet Take by mouth daily. For 6 days (Patient not taking: Reported on 01/13/2018)   Facility-Administered Encounter Medications as of 01/13/2018  Medication  . 0.9 %  sodium chloride infusion      Medical History: Past Medical History:  Diagnosis Date  . Allergic rhinitis   . Allergy   . Aortic atherosclerosis ([facility])   . Diabetes mellitus, type 2 ([facility])    no medicines- was pre diabtes- diet controlled   . Diverticulitis   . Essential hypertension   . GERD (gastroesophageal reflux disease)   . Hyperlipidemia   . Hypertension   . Hypothyroidism   . Obesity   . Osteoporosis   . Transient insomnia   . Tubular adenoma of colon 05/2013     Today's Vitals   01/13/18 1021  BP: (!) 152/74  Pulse:  84  Resp: 16  SpO2: 96%  Weight: 169 lb 6.4 oz (76.8 kg)  Height: 5\' 6"  (1.676 m)     Review of Systems  Constitutional: Positive for chills and fatigue. Negative for activity change, appetite change, fever and unexpected weight change.  HENT: Positive for congestion, ear pain, hoarse voice, postnasal drip, rhinorrhea, sinus pressure, sinus pain, sore throat and voice change.   Eyes: Negative.   Respiratory: Positive for cough and wheezing.   Cardiovascular: Negative for chest pain and palpitations.  Gastrointestinal: Positive for nausea. Negative for constipation, diarrhea and vomiting.   Endocrine: Negative for cold intolerance, heat intolerance, polydipsia, polyphagia and polyuria.  Genitourinary: Positive for flank pain and hematuria. Negative for dysuria.  Musculoskeletal: Positive for back pain.  Allergic/Immunologic: Positive for environmental allergies.  Neurological: Positive for headaches.  Hematological: Negative for adenopathy.  Psychiatric/Behavioral: Negative for agitation and dysphoric mood. The patient is not nervous/anxious.     Physical Exam  Constitutional: She is oriented to person, place, and time. She appears well-developed and well-nourished. No distress.  HENT:  Head: Normocephalic and atraumatic.  Right Ear: Tympanic membrane is erythematous.  Left Ear: Tympanic membrane is erythematous.  Nose: Rhinorrhea present. Right sinus exhibits frontal sinus tenderness. Left sinus exhibits frontal sinus tenderness.  Mouth/Throat: Oropharynx is clear and moist. No oropharyngeal exudate.  Eyes: Pupils are equal, round, and reactive to light. EOM are normal.  Neck: Normal range of motion. Neck supple. No JVD present. No tracheal deviation present. No thyromegaly present.  Cardiovascular: Normal rate, regular rhythm and normal heart sounds. Exam reveals no gallop and no friction rub.  No murmur heard. Pulmonary/Chest: Effort normal and breath sounds normal. No respiratory distress. She has no wheezes. She has no rales. She exhibits no tenderness.  Abdominal: Soft. Bowel sounds are normal. There is no tenderness.  Genitourinary:  Genitourinary Comments: Urin e sample positive for large blood   Musculoskeletal: Normal range of motion.  Lymphadenopathy:    She has cervical adenopathy.  Neurological: She is alert and oriented to person, place, and time. No cranial nerve deficit.  Skin: Skin is warm and dry. She is not diaphoretic.  Psychiatric: She has a normal mood and affect. Her behavior is normal. Judgment and thought content normal.  Nursing note and vitals  reviewed.  Assessment/Plan: 1. Urinary tract infection with hematuria, site unspecified Levofloxacin 500mg  daily for 10 days. Send for culture and sensitivity and adjust abx as indicated  - levofloxacin (LEVAQUIN) 500 MG tablet; Take 1 tablet (500 mg total) by mouth daily.  Dispense: 10 tablet; Refill: 0  2. Dysuria Treat with antibiotics and adjust as indicated.  - POCT Urinalysis Dipstick - CULTURE, URINE COMPREHENSIVE  3. Acute non-recurrent sinusitis, unspecified location Levofloxacin 500mg  daily to also cver acute sinusitis. Add prednisone 10mg  dose pack. Take as directed for 6 days. Use flonase nasal spray daily.  - predniSONE (STERAPRED UNI-PAK 21 TAB) 10 MG (21) TBPK tablet; 6 day taper - take by mouth as directed for 6 days  Dispense: 21 tablet; Refill: 0  4. Essential hypertension Generally stable. Continue bp medication as prescribed.   5. Postoperative hypothyroidism Use levothyroxine as prescribed.   General Counseling: monai hindes understanding of the findings of todays visit and agrees with plan of treatment. I have discussed any further diagnostic evaluation that may be needed or ordered today. We also reviewed her medications today. she has been encouraged to call the office with any questions or concerns that should arise related to todays  visit.    Counseling:  Rest and increase fluids. Continue using OTC medication to control symptoms.   This patient was seen by Leretha Pol, FNP- C in Collaboration with Dr Lavera Guise as a part of collaborative care agreement   Orders Placed This Encounter  Procedures  . CULTURE, URINE COMPREHENSIVE  . POCT Urinalysis Dipstick    Meds ordered this encounter  Medications  . predniSONE (STERAPRED UNI-PAK 21 TAB) 10 MG (21) TBPK tablet    Sig: 6 day taper - take by mouth as directed for 6 days    Dispense:  21 tablet    Refill:  0    Order Specific Question:   Supervising Provider    Answer:   Lavera Guise  [place]  . levofloxacin (LEVAQUIN) 500 MG tablet    Sig: Take 1 tablet (500 mg total) by mouth daily.    Dispense:  10 tablet    Refill:  0    Order Specific Question:   Supervising Provider    Answer:   Lavera Guise [[id number]]    Time spent:15 Minutes

## 2018-01-14 DIAGNOSIS — R300 Dysuria: Secondary | ICD-10-CM | POA: Insufficient documentation

## 2018-01-14 DIAGNOSIS — E039 Hypothyroidism, unspecified: Secondary | ICD-10-CM | POA: Insufficient documentation

## 2018-01-14 DIAGNOSIS — J0141 Acute recurrent pansinusitis: Secondary | ICD-10-CM | POA: Insufficient documentation

## 2018-01-14 DIAGNOSIS — N390 Urinary tract infection, site not specified: Secondary | ICD-10-CM | POA: Insufficient documentation

## 2018-01-14 DIAGNOSIS — R319 Hematuria, unspecified: Secondary | ICD-10-CM

## 2018-01-17 LAB — CULTURE, URINE COMPREHENSIVE

## 2018-01-19 ENCOUNTER — Telehealth: Payer: Self-pay

## 2018-01-19 ENCOUNTER — Other Ambulatory Visit: Payer: Self-pay | Admitting: Nurse Practitioner

## 2018-01-19 DIAGNOSIS — J019 Acute sinusitis, unspecified: Secondary | ICD-10-CM

## 2018-01-19 DIAGNOSIS — R059 Cough, unspecified: Secondary | ICD-10-CM

## 2018-01-19 DIAGNOSIS — R05 Cough: Secondary | ICD-10-CM

## 2018-01-19 MED ORDER — PREDNISONE 10 MG (21) PO TBPK: ORAL_TABLET | ORAL | 0 refills | Status: DC

## 2018-01-19 MED ORDER — HYDROCOD POLST-CPM POLST ER 10-8 MG/5ML PO SUER: 5.0000 mL | Freq: Two times a day (BID) | ORAL | 0 refills | Status: DC | PRN

## 2018-01-19 NOTE — Telephone Encounter (Signed)
Cough and body aches still present after finishing prednisone taper. Tessalon perls ot helping cough. Will repeat prednisone taper for 6 days. Added tussionex for cough. Take 1 tsp twice daily as needed for cough. Please advise, may cause drowsiness and dizziness. She should not drive after taking this medication.

## 2018-01-19 NOTE — Progress Notes (Signed)
Cough and body aches still present after finishing prednisone taper. Tessalon perls ot helping cough. Will repeat prednisone taper for 6 days. Added tussionex for cough. Take 1 tsp twice daily as needed for cough. Please advise, may cause drowsiness and dizziness. She should not drive after taking this medication.

## 2018-01-19 NOTE — Telephone Encounter (Signed)
Pt advised we repeat  Prednisone  Taper and tussinex see other reminder

## 2018-02-03 ENCOUNTER — Other Ambulatory Visit: Payer: Self-pay

## 2018-02-03 MED ORDER — FENOFIBRATE 54 MG PO TABS: ORAL_TABLET | ORAL | 0 refills | Status: DC

## 2018-02-08 ENCOUNTER — Telehealth: Payer: Self-pay

## 2018-02-09 ENCOUNTER — Other Ambulatory Visit: Payer: Self-pay | Admitting: Nurse Practitioner

## 2018-02-09 DIAGNOSIS — J019 Acute sinusitis, unspecified: Secondary | ICD-10-CM

## 2018-02-09 DIAGNOSIS — R05 Cough: Secondary | ICD-10-CM

## 2018-02-09 DIAGNOSIS — N390 Urinary tract infection, site not specified: Secondary | ICD-10-CM

## 2018-02-09 DIAGNOSIS — R059 Cough, unspecified: Secondary | ICD-10-CM

## 2018-02-09 DIAGNOSIS — R319 Hematuria, unspecified: Principal | ICD-10-CM

## 2018-02-09 MED ORDER — PREDNISONE 10 MG (21) PO TBPK: ORAL_TABLET | ORAL | 0 refills | Status: DC

## 2018-02-09 MED ORDER — HYDROCOD POLST-CPM POLST ER 10-8 MG/5ML PO SUER: 5.0000 mL | Freq: Two times a day (BID) | ORAL | 0 refills | Status: DC | PRN

## 2018-02-09 MED ORDER — LEVOFLOXACIN 500 MG PO TABS: 500.0000 mg | ORAL_TABLET | Freq: Every day | ORAL | 0 refills | Status: DC

## 2018-02-09 NOTE — Telephone Encounter (Signed)
Pt advised we send medicaations if she is not feeling better need to been seen may be need to send her to specialist

## 2018-02-09 NOTE — Telephone Encounter (Signed)
Patient continues to have symptoms of UTI with cough and congestion. Repeat dose levofloxacin 500mg  daily for 10 days. Repeat 6 day taper of prednisone. Also renewed cough syrup. All were sent to her [facility].

## 2018-02-09 NOTE — Progress Notes (Signed)
Patient continues to have symptoms of UTI with cough and congestion. Repeat dose levofloxacin 500mg  daily for 10 days. Repeat 6 day taper of prednisone. Also renewed cough syrup. All were sent to her [facility].

## 2018-02-21 ENCOUNTER — Telehealth: Payer: Self-pay | Admitting: Nurse Practitioner

## 2018-02-21 NOTE — Telephone Encounter (Signed)
Pt called and states that she has been on two rounds of levaquin and three rounds of prednisone, coughing up green sputum wants to know whats next , chest xray , please advise

## 2018-02-21 NOTE — Telephone Encounter (Signed)
Please have her set up with chest x-ray and pulmonary function test. Thanks.

## 2018-02-22 ENCOUNTER — Ambulatory Visit
Admission: RE | Admit: 2018-02-22 | Discharge: 2018-02-22 | Disposition: A | Discharge status: Home / Self Care | Payer: PPO | Source: Ambulatory Visit | Attending: Nurse Practitioner | Admitting: Nurse Practitioner

## 2018-02-22 ENCOUNTER — Other Ambulatory Visit: Payer: Self-pay | Admitting: Nurse Practitioner

## 2018-02-22 DIAGNOSIS — R05 Cough: Secondary | ICD-10-CM | POA: Insufficient documentation

## 2018-02-22 DIAGNOSIS — R059 Cough, unspecified: Secondary | ICD-10-CM

## 2018-02-22 NOTE — Telephone Encounter (Signed)
Patient advised to go for chest x ray, she also states she does not with to schd PFT until after she gets x ray results she doesn't think she needs a breathing test at moment. Toni [name]

## 2018-02-23 ENCOUNTER — Other Ambulatory Visit: Payer: Self-pay | Admitting: Nurse Practitioner

## 2018-02-23 ENCOUNTER — Telehealth: Payer: Self-pay

## 2018-02-23 DIAGNOSIS — K5732 Diverticulitis of large intestine without perforation or abscess without bleeding: Secondary | ICD-10-CM

## 2018-02-23 DIAGNOSIS — J019 Acute sinusitis, unspecified: Secondary | ICD-10-CM

## 2018-02-23 MED ORDER — PREDNISONE 10 MG (21) PO TBPK: ORAL_TABLET | ORAL | 0 refills | Status: DC

## 2018-02-23 MED ORDER — CIPROFLOXACIN HCL 500 MG PO TABS: 500.0000 mg | ORAL_TABLET | Freq: Two times a day (BID) | ORAL | 0 refills | Status: DC

## 2018-02-23 NOTE — Telephone Encounter (Signed)
-----   Message from Ronnell Freshwater, NP sent at 02/23/2018  1:00 PM EDT ----- Please let the patient know that her chest x-ray was normal. Please aske how she is feeling. Thanks.

## 2018-02-23 NOTE — Progress Notes (Signed)
Improved symptoms, but developing diverticulitis type symptoms. Will do trial of cipro 500mg  bid for 10 days and additional round prednisone for 6 days. OTC medication to help with drainage. ENT as needed.

## 2018-02-23 NOTE — Telephone Encounter (Signed)
Please let her know Will do trial of cipro 500mg  bid for 10 days and additional round prednisone for 6 days. OTC medication to help with drainage. ENT as needed. thanks

## 2018-02-24 NOTE — Telephone Encounter (Signed)
Called pt and informed her of the medications sent to her pharmacy.

## 2018-03-17 DIAGNOSIS — Z961 Presence of intraocular lens: Secondary | ICD-10-CM | POA: Diagnosis not present

## 2018-03-22 ENCOUNTER — Other Ambulatory Visit: Payer: Self-pay | Admitting: Internal Medicine

## 2018-04-11 ENCOUNTER — Other Ambulatory Visit: Payer: Self-pay | Admitting: Nurse Practitioner

## 2018-04-11 MED ORDER — HYDROCHLOROTHIAZIDE 12.5 MG PO CAPS: ORAL_CAPSULE | ORAL | 2 refills | Status: DC

## 2018-04-25 ENCOUNTER — Telehealth: Payer: Self-pay

## 2018-04-25 DIAGNOSIS — X32XXXA Exposure to sunlight, initial encounter: Secondary | ICD-10-CM | POA: Diagnosis not present

## 2018-04-25 DIAGNOSIS — L821 Other seborrheic keratosis: Secondary | ICD-10-CM | POA: Diagnosis not present

## 2018-04-25 DIAGNOSIS — D2272 Melanocytic nevi of left lower limb, including hip: Secondary | ICD-10-CM | POA: Diagnosis not present

## 2018-04-25 DIAGNOSIS — D2271 Melanocytic nevi of right lower limb, including hip: Secondary | ICD-10-CM | POA: Diagnosis not present

## 2018-04-25 DIAGNOSIS — D225 Melanocytic nevi of trunk: Secondary | ICD-10-CM | POA: Diagnosis not present

## 2018-04-25 DIAGNOSIS — D2262 Melanocytic nevi of left upper limb, including shoulder: Secondary | ICD-10-CM | POA: Diagnosis not present

## 2018-04-25 DIAGNOSIS — L814 Other melanin hyperpigmentation: Secondary | ICD-10-CM | POA: Diagnosis not present

## 2018-04-25 DIAGNOSIS — D2261 Melanocytic nevi of right upper limb, including shoulder: Secondary | ICD-10-CM | POA: Diagnosis not present

## 2018-04-26 NOTE — Telephone Encounter (Signed)
Gave lab slip.

## 2018-04-26 NOTE — Telephone Encounter (Signed)
Called pt and told her that her lab slip will be in the front. She said she will come pick it up today.

## 2018-04-28 ENCOUNTER — Other Ambulatory Visit: Payer: Self-pay | Admitting: Nurse Practitioner

## 2018-04-28 DIAGNOSIS — E782 Mixed hyperlipidemia: Secondary | ICD-10-CM | POA: Diagnosis not present

## 2018-04-28 DIAGNOSIS — I10 Essential (primary) hypertension: Secondary | ICD-10-CM | POA: Diagnosis not present

## 2018-04-28 DIAGNOSIS — E559 Vitamin D deficiency, unspecified: Secondary | ICD-10-CM | POA: Diagnosis not present

## 2018-04-28 DIAGNOSIS — Z0000 Encounter for general adult medical examination without abnormal findings: Secondary | ICD-10-CM | POA: Diagnosis not present

## 2018-04-29 LAB — COMPREHENSIVE METABOLIC PANEL
A/G RATIO: 1.9 (ref 1.2–2.2)
ALT: 27 IU/L (ref 0–32)
AST: 23 IU/L (ref 0–40)
Albumin: 4.1 g/dL (ref 3.5–4.8)
Alkaline Phosphatase: 42 IU/L (ref 39–117)
BUN/Creatinine Ratio: 19 (ref 12–28)
BUN: 17 mg/dL (ref 8–27)
Bilirubin Total: 0.9 mg/dL (ref 0.0–1.2)
CO2: 24 mmol/L (ref 20–29)
CREATININE: 0.91 mg/dL (ref 0.57–1.00)
Calcium: 9.9 mg/dL (ref 8.7–10.3)
Chloride: 105 mmol/L (ref 96–106)
GFR, EST AFRICAN AMERICAN: 74 mL/min/{1.73_m2} (ref >59)
GFR, EST NON AFRICAN AMERICAN: 64 mL/min/{1.73_m2} (ref >59)
GLOBULIN, TOTAL: 2.2 g/dL (ref 1.5–4.5)
Glucose: 127 mg/dL — ABNORMAL HIGH (ref 65–99)
POTASSIUM: 4.2 mmol/L (ref 3.5–5.2)
SODIUM: 143 mmol/L (ref 134–144)
TOTAL PROTEIN: 6.3 g/dL (ref 6.0–8.5)

## 2018-04-29 LAB — TSH: TSH: 0.254 u[IU]/mL — AB (ref 0.450–4.500)

## 2018-04-29 LAB — HGB A1C W/O EAG: Hgb A1c MFr Bld: 5.9 % — ABNORMAL HIGH (ref 4.8–5.6)

## 2018-04-29 LAB — CBC
Hematocrit: 42.9 % (ref 34.0–46.6)
Hemoglobin: 14.4 g/dL (ref 11.1–15.9)
MCH: 30.1 pg (ref 26.6–33.0)
MCHC: 33.6 g/dL (ref 31.5–35.7)
MCV: 90 fL (ref 79–97)
PLATELETS: 214 10*3/uL (ref 150–450)
RBC: 4.79 x10E6/uL (ref 3.77–5.28)
RDW: 12.4 % (ref 12.3–15.4)
WBC: 4.2 10*3/uL (ref 3.4–10.8)

## 2018-04-29 LAB — LIPID PANEL W/O CHOL/HDL RATIO
CHOLESTEROL TOTAL: 287 mg/dL — AB (ref 100–199)
HDL: 45 mg/dL (ref >39)
LDL Calculated: 209 mg/dL — ABNORMAL HIGH (ref 0–99)
Triglycerides: 163 mg/dL — ABNORMAL HIGH (ref 0–149)
VLDL Cholesterol Cal: 33 mg/dL (ref 5–40)

## 2018-04-29 LAB — T3: T3, Total: 82 ng/dL (ref 71–180)

## 2018-04-29 LAB — T4, FREE: FREE T4: 1.71 ng/dL (ref 0.82–1.77)

## 2018-04-29 LAB — VITAMIN D 25 HYDROXY (VIT D DEFICIENCY, FRACTURES): VIT D 25 HYDROXY: 28.8 ng/mL — AB (ref 30.0–100.0)

## 2018-05-02 ENCOUNTER — Ambulatory Visit (INDEPENDENT_AMBULATORY_CARE_PROVIDER_SITE_OTHER): Payer: PPO | Admitting: Nurse Practitioner

## 2018-05-02 ENCOUNTER — Encounter: Payer: Self-pay | Admitting: Nurse Practitioner

## 2018-05-02 VITALS — BP 142/68 | HR 68 | Resp 16 | Ht 66.0 in | Wt 168.4 lb

## 2018-05-02 DIAGNOSIS — Z0001 Encounter for general adult medical examination with abnormal findings: Secondary | ICD-10-CM | POA: Diagnosis not present

## 2018-05-02 DIAGNOSIS — J0141 Acute recurrent pansinusitis: Secondary | ICD-10-CM

## 2018-05-02 DIAGNOSIS — E782 Mixed hyperlipidemia: Secondary | ICD-10-CM

## 2018-05-02 DIAGNOSIS — R053 Chronic cough: Secondary | ICD-10-CM | POA: Insufficient documentation

## 2018-05-02 DIAGNOSIS — R059 Cough, unspecified: Secondary | ICD-10-CM | POA: Insufficient documentation

## 2018-05-02 DIAGNOSIS — I10 Essential (primary) hypertension: Secondary | ICD-10-CM

## 2018-05-02 DIAGNOSIS — R05 Cough: Secondary | ICD-10-CM | POA: Diagnosis not present

## 2018-05-02 NOTE — Progress Notes (Signed)
[facility] [place], [place] [address]  Internal MEDICINE  Office Visit Note  Patient Name: Toni [name]  [id number]  [id number]  Date of Service: 05/02/2018  Chief Complaint  Patient presents with  . Medicare Wellness    well visit  . Sinusitis    pt is concerned she has a sinus infection, has tried OTC medications in the last 4 moths and no improvemment  . Hyperlipidemia  . Hypertension     The patient is c/o sinus congestion and frequent throat clearing. Has been going off and on for several months. Has completed several rounds of antibiotics and prednisone. Does feel better after completing course, but then symptoms recur shortly after finishing antibiotics. States that after this last course of antibiotics, she felt the best. Actually was on round of cipro with prednisone due to flare of diverticulitis. She did have a chest x-ray in august which was clear of cardiopulmonary issues.  She had labs done prior to this visit. Her LDL and total cholesterol are still moderately elevated. She is unable to tolerate statins of any sort. She every tried injectable medication for high cholesterol and still had severe joint pain and muscle aches. She does take fenofibrate. Triglycerides were 163. She does take fish oil every day.  She has been labeled as diabetic in the past. Current blood glucose was 127 and HgbA1c was 5.9.   Pt is here for routine health maintenance examination  Current Medication: Outpatient Encounter Medications as of 05/02/2018  Medication Sig  . aspirin 81 MG tablet Take 81 mg by mouth daily.  . Calcium Carbonate-Vitamin D (CALCIUM + D PO) Take 1 tablet by mouth daily.   . cetirizine (ZYRTEC) 10 MG tablet Take 10 mg by mouth as needed.   . chlorpheniramine-HYDROcodone (TUSSIONEX PENNKINETIC ER) 10-8 MG/5ML SUER Take 5 mLs by mouth every 12 (twelve) hours as needed for cough.  . cholecalciferol (VITAMIN D) 1000 units tablet Take 1,000  Units by mouth daily.  Mariane Baumgarten Calcium (STOOL SOFTENER PO) Take 1 tablet by mouth daily.  . fenofibrate 54 MG tablet TAKE 1 TABLET BY MOUTH EVERY DAY FOR ELEVATED CHOLESTEROL  . hydrochlorothiazide (HYDRODIURIL) 12.5 MG tablet Take 1 tablet (12.5 mg total) by mouth daily.  . hydrochlorothiazide (MICROZIDE) 12.5 MG capsule TAKE 1 CAPSULE BY MOUTH EVERY DAY  . olmesartan (BENICAR) 20 MG tablet TAKE 1 TABLET BY MOUTH DAILY  . pantoprazole (PROTONIX) 40 MG tablet Take 40 mg by mouth daily.  Marland Kitchen SYNTHROID 112 MCG tablet TAKE 1 TABLET BY MOUTH DAILY . ON SUNDAYS TAKE 1/2 TABLET  . valsartan (DIOVAN) 160 MG tablet Take 160 mg by mouth daily.  . [DISCONTINUED] ciprofloxacin (CIPRO) 500 MG tablet Take 1 tablet (500 mg total) by mouth 2 (two) times daily. (Patient not taking: Reported on 05/02/2018)  . [DISCONTINUED] predniSONE (STERAPRED UNI-PAK 21 TAB) 10 MG (21) TBPK tablet 6 day taper - take by mouth as directed for 6 days (Patient not taking: Reported on 05/02/2018)   Facility-Administered Encounter Medications as of 05/02/2018  Medication  . 0.9 %  sodium chloride infusion    Surgical History: Past Surgical History:  Procedure Laterality Date  . APPENDECTOMY    . BUNIONECTOMY     right foot  . [facility]   . CATARACT EXTRACTION, BILATERAL Left 10/20/2017  . CATARACT EXTRACTION, BILATERAL Right 10/27/2017  . CHOLECYSTECTOMY    . COLONOSCOPY    . HAMMER TOE SURGERY  12/2012   left toe  . PARTIAL HYSTERECTOMY    . POLYPECTOMY    . ROBOTIC ASSISTED SALPINGO OOPHERECTOMY     right ovary first removed then left ovary removed years later  . TONSILLECTOMY  age 15  . TOTAL THYROIDECTOMY  09/2011    Medical History: Past Medical History:  Diagnosis Date  . Allergic rhinitis   . Allergy   . Aortic atherosclerosis ([place])   . Diabetes mellitus, type 2 (HCC)    no medicines- was pre diabtes- diet controlled   . Diverticulitis   . Essential hypertension    . GERD (gastroesophageal reflux disease)   . Hyperlipidemia   . Hypertension   . Hypothyroidism   . Obesity   . Osteoporosis   . Transient insomnia   . Tubular adenoma of colon 05/2013    Family History: Family History  Problem Relation Age of Onset  . Throat cancer Father   . Esophageal cancer Father   . Colon cancer Maternal Aunt   . Colon cancer Maternal Uncle   . Colon polyps Maternal Aunt   . Colon polyps Maternal Uncle   . Heart disease Maternal Uncle   . Stomach cancer Maternal Uncle   . Prostate cancer Maternal Uncle   . Breast cancer Cousin   . Diabetes Neg Hx   . Kidney disease Neg Hx   . Gallbladder disease Neg Hx   . Rectal cancer Neg Hx       Review of Systems  Constitutional: Negative for activity change, appetite change, chills, fatigue, fever and unexpected weight change.  HENT: Positive for congestion, ear pain, postnasal drip, rhinorrhea, sinus pressure and sinus pain. Negative for sore throat and voice change.        Frequent throat clearing.   Eyes: Negative.   Respiratory: Positive for cough. Negative for wheezing.   Cardiovascular: Negative for chest pain and palpitations.  Gastrointestinal: Positive for nausea. Negative for constipation, diarrhea and vomiting.  Endocrine: Negative for cold intolerance, heat intolerance, polydipsia, polyphagia and polyuria.       Thyroid panel normal with blood work.   Genitourinary: Negative for dysuria, flank pain and hematuria.  Musculoskeletal: Negative for back pain.  Allergic/Immunologic: Positive for environmental allergies.  Neurological: Positive for headaches.  Hematological: Negative for adenopathy.  Psychiatric/Behavioral: Negative for agitation and dysphoric mood. The patient is not nervous/anxious.      Vital Signs: BP (!) 142/68 (BP Location: Left Arm, Patient Position: Sitting, Cuff Size: Large)   Pulse 68   Resp 16   Ht 5\' 6"  (1.676 m)   Wt 168 lb 6.4 oz (76.4 kg)   SpO2 95%   BMI 27.18  kg/m    Physical Exam  Constitutional: She is oriented to person, place, and time. She appears well-developed and well-nourished. No distress.  HENT:  Head: Normocephalic and atraumatic.  Right Ear: Tympanic membrane is erythematous.  Left Ear: Tympanic membrane is erythematous.  Nose: Rhinorrhea present. Right sinus exhibits frontal sinus tenderness. Left sinus exhibits maxillary sinus tenderness and frontal sinus tenderness.  Mouth/Throat: Oropharynx is clear and moist. No oropharyngeal exudate.  Eyes: Pupils are equal, round, and reactive to light. EOM are normal.  Neck: Normal range of motion. Neck supple. No JVD present. No tracheal deviation present. No thyromegaly present.  Cardiovascular: Normal rate, regular rhythm, normal heart sounds and intact distal pulses. Exam reveals no gallop and no friction rub.  No murmur heard. Pulmonary/Chest: Effort normal and breath sounds normal. No respiratory distress. She has  no wheezes. She has no rales. She exhibits no tenderness.  Congested, non-productive cough present.   Abdominal: Soft. Bowel sounds are normal. There is no tenderness.  Genitourinary:  Genitourinary Comments: Urin e sample positive for large blood   Musculoskeletal: Normal range of motion.  Lymphadenopathy:    She has cervical adenopathy.  Neurological: She is alert and oriented to person, place, and time. No cranial nerve deficit.  Skin: Skin is warm and dry. Capillary refill takes less than 2 seconds. She is not diaphoretic.  Psychiatric: She has a normal mood and affect. Her behavior is normal. Judgment and thought content normal.  Nursing note and vitals reviewed.  Depression screen [facility] 2/9 05/02/2018 01/13/2018  Decreased Interest 0 0  Down, Depressed, Hopeless 0 0  PHQ - 2 Score 0 0    Functional Status Survey: Is the patient deaf or have difficulty hearing?: No Does the patient have difficulty seeing, even when wearing glasses/contacts?: No Does the patient  have difficulty concentrating, remembering, or making decisions?: No Does the patient have difficulty walking or climbing stairs?: No Does the patient have difficulty dressing or bathing?: No Does the patient have difficulty doing errands alone such as visiting a doctor's office or shopping?: No  MMSE - [place] Exam 05/02/2018  Orientation to time 5  Orientation to Place 5  Registration 3  Attention/ Calculation 5  Recall 3  Language- name 2 objects 2  Language- repeat 1  Language- follow 3 step command 3  Language- read & follow direction 1  Write a sentence 1  Copy design 1  Total score 30    Fall Risk  05/02/2018 01/13/2018  Falls in the past year? No No     LABS: Recent Results (from the past 2160 hour(s))  Comprehensive metabolic panel     Status: Abnormal   Collection Time: 04/28/18  8:48 AM  Result Value Ref Range   Glucose 127 (H) 65 - 99 mg/dL   BUN 17 8 - 27 mg/dL   Creatinine, Ser 0.91 0.57 - 1.00 mg/dL   GFR calc non Af Amer 64 >59 mL/min/1.73   GFR calc Af Amer 74 >59 mL/min/1.73   BUN/Creatinine Ratio 19 12 - 28   Sodium 143 134 - 144 mmol/L   Potassium 4.2 3.5 - 5.2 mmol/L   Chloride 105 96 - 106 mmol/L   CO2 24 20 - 29 mmol/L   Calcium 9.9 8.7 - 10.3 mg/dL   Total Protein 6.3 6.0 - 8.5 g/dL   Albumin 4.1 3.5 - 4.8 g/dL   Globulin, Total 2.2 1.5 - 4.5 g/dL   Albumin/Globulin Ratio 1.9 1.2 - 2.2   Bilirubin Total 0.9 0.0 - 1.2 mg/dL   Alkaline Phosphatase 42 39 - 117 IU/L   AST 23 0 - 40 IU/L   ALT 27 0 - 32 IU/L  CBC     Status: None   Collection Time: 04/28/18  8:48 AM  Result Value Ref Range   WBC 4.2 3.4 - 10.8 x10E3/uL   RBC 4.79 3.77 - 5.28 x10E6/uL   Hemoglobin 14.4 11.1 - 15.9 g/dL   Hematocrit 42.9 34.0 - 46.6 %   MCV 90 79 - 97 fL   MCH 30.1 26.6 - 33.0 pg   MCHC 33.6 31.5 - 35.7 g/dL   RDW 12.4 12.3 - 15.4 %   Platelets 214 150 - 450 x10E3/uL  Lipid Panel w/o Chol/HDL Ratio     Status: Abnormal   Collection Time: 04/28/18  8:48 AM  Result Value Ref Range   Cholesterol, Total 287 (H) 100 - 199 mg/dL   Triglycerides 163 (H) 0 - 149 mg/dL   HDL 45 >39 mg/dL   VLDL Cholesterol Cal 33 5 - 40 mg/dL   LDL Calculated 209 (H) 0 - 99 mg/dL  Hgb A1c w/o eAG     Status: Abnormal   Collection Time: 04/28/18  8:48 AM  Result Value Ref Range   Hgb A1c MFr Bld 5.9 (H) 4.8 - 5.6 %    Comment:          Prediabetes: 5.7 - 6.4          Diabetes: >6.4          Glycemic control for adults with diabetes: <7.0   T4, free     Status: None   Collection Time: 04/28/18  8:48 AM  Result Value Ref Range   Free T4 1.71 0.82 - 1.77 ng/dL  TSH     Status: Abnormal   Collection Time: 04/28/18  8:48 AM  Result Value Ref Range   TSH 0.254 (L) 0.450 - 4.500 uIU/mL  VITAMIN D 25 Hydroxy (Vit-D Deficiency, Fractures)     Status: Abnormal   Collection Time: 04/28/18  8:48 AM  Result Value Ref Range   Vit D, 25-Hydroxy 28.8 (L) 30.0 - 100.0 ng/mL    Comment: Vitamin D deficiency has been defined by the [place] and an Endocrine Society practice guideline as a level of serum 25-OH vitamin D less than 20 ng/mL (1,2). The Endocrine Society went on to further define vitamin D insufficiency as a level between 21 and 29 ng/mL (2). 1. IOM (Institute of Medicine). 2010. Dietary reference    intakes for calcium and D. [place]: The    [employer]. 2. Holick MF, Binkley Allen, Bischoff-Ferrari HA, et al.    Evaluation, treatment, and prevention of vitamin D    deficiency: an Endocrine Society clinical practice    guideline. JCEM. 2011 Jul; 96(7):1911-30.   T3     Status: None   Collection Time: 04/28/18  8:48 AM  Result Value Ref Range   T3, Total 82 71 - 180 ng/dL   Assessment/Plan:  1. Encounter for general adult medical examination with abnormal findings Annual health maintenance exam today  2. Acute recurrent pansinusitis cipro 500mg  bid for 10 days. Add prednisone taper. Take as directed for 6 days.  Use OTC medications as needed and as indicated to alleviate symptoms.   3. Cough Written rx for tussionex was given to the patient. Use as directed and with caution. If continues, may consider getting UGI to evaluate for GERD.   4. Essential hypertension Well managed. Continue bp medication as prescribed   5. Mixed hyperlipidemia Reviewed labs. Continue fenofibrate and fish oil supplementation as before. Continue to follow prudent diet. Continue to monitor closely.    General Counseling: dyonna jaspers understanding of the findings of todays visit and agrees with plan of treatment. I have discussed any further diagnostic evaluation that may be needed or ordered today. We also reviewed her medications today. she has been encouraged to call the office with any questions or concerns that should arise related to todays visit.    Counseling:  Cardiac risk factor modification:  1. Control blood pressure. 2. Exercise as prescribed. 3. Follow low sodium, low fat diet. and low fat and low cholestrol diet. 4. Take ASA 81mg  once a day. 5. Restricted calories diet to lose weight.  This patient was seen by Leretha Pol FNP Collaboration with Dr Lavera Guise as a part of collaborative care agreement  Time spent: [place], MD  Internal Medicine

## 2018-05-03 ENCOUNTER — Other Ambulatory Visit: Payer: Self-pay

## 2018-05-03 MED ORDER — FENOFIBRATE 54 MG PO TABS: ORAL_TABLET | ORAL | 1 refills | Status: DC

## 2018-05-09 ENCOUNTER — Telehealth: Payer: Self-pay

## 2018-05-16 ENCOUNTER — Other Ambulatory Visit: Payer: Self-pay | Admitting: Nurse Practitioner

## 2018-05-16 DIAGNOSIS — J329 Chronic sinusitis, unspecified: Secondary | ICD-10-CM

## 2018-05-16 NOTE — Telephone Encounter (Signed)
CT sinuses ordered with order placed in epic for CT sinuses due to chronic and recurrent sinus infection.

## 2018-05-16 NOTE — Progress Notes (Signed)
CT sinuses ordered with order placed in epic for CT sinuses due to chronic and recurrent sinus infection.

## 2018-05-26 ENCOUNTER — Ambulatory Visit
Admission: RE | Admit: 2018-05-26 | Discharge: 2018-05-26 | Disposition: A | Discharge status: Home / Self Care | Payer: PPO | Source: Ambulatory Visit | Attending: Nurse Practitioner | Admitting: Nurse Practitioner

## 2018-05-26 DIAGNOSIS — J3489 Other specified disorders of nose and nasal sinuses: Secondary | ICD-10-CM | POA: Diagnosis not present

## 2018-05-26 DIAGNOSIS — J329 Chronic sinusitis, unspecified: Secondary | ICD-10-CM

## 2018-05-30 ENCOUNTER — Telehealth: Payer: Self-pay

## 2018-05-31 NOTE — Telephone Encounter (Signed)
Send beth reminder

## 2018-06-07 DIAGNOSIS — M13842 Other specified arthritis, left hand: Secondary | ICD-10-CM | POA: Diagnosis not present

## 2018-06-20 ENCOUNTER — Other Ambulatory Visit: Payer: Self-pay | Admitting: Internal Medicine

## 2018-06-21 DIAGNOSIS — M13842 Other specified arthritis, left hand: Secondary | ICD-10-CM | POA: Diagnosis not present

## 2018-06-22 DIAGNOSIS — J309 Allergic rhinitis, unspecified: Secondary | ICD-10-CM | POA: Diagnosis not present

## 2018-06-22 DIAGNOSIS — J329 Chronic sinusitis, unspecified: Secondary | ICD-10-CM | POA: Diagnosis not present

## 2018-07-05 DIAGNOSIS — J301 Allergic rhinitis due to pollen: Secondary | ICD-10-CM | POA: Diagnosis not present

## 2018-07-13 DIAGNOSIS — J329 Chronic sinusitis, unspecified: Secondary | ICD-10-CM | POA: Diagnosis not present

## 2018-07-13 DIAGNOSIS — J309 Allergic rhinitis, unspecified: Secondary | ICD-10-CM | POA: Diagnosis not present

## 2018-08-18 DIAGNOSIS — H60543 Acute eczematoid otitis externa, bilateral: Secondary | ICD-10-CM | POA: Diagnosis not present

## 2018-08-18 DIAGNOSIS — J309 Allergic rhinitis, unspecified: Secondary | ICD-10-CM | POA: Diagnosis not present

## 2018-08-31 ENCOUNTER — Other Ambulatory Visit: Payer: Self-pay

## 2018-08-31 MED ORDER — SYNTHROID 112 MCG PO TABS: ORAL_TABLET | ORAL | 6 refills | Status: DC

## 2018-09-26 ENCOUNTER — Other Ambulatory Visit: Payer: Self-pay

## 2018-09-26 ENCOUNTER — Ambulatory Visit (INDEPENDENT_AMBULATORY_CARE_PROVIDER_SITE_OTHER): Payer: PPO | Admitting: Adult Health

## 2018-09-26 ENCOUNTER — Encounter: Payer: Self-pay | Admitting: Adult Health

## 2018-09-26 VITALS — BP 132/89 | HR 78 | Temp 98.6°F | Resp 16 | Ht 66.0 in | Wt 171.0 lb

## 2018-09-26 DIAGNOSIS — J0100 Acute maxillary sinusitis, unspecified: Secondary | ICD-10-CM | POA: Diagnosis not present

## 2018-09-26 DIAGNOSIS — R059 Cough, unspecified: Secondary | ICD-10-CM

## 2018-09-26 DIAGNOSIS — R05 Cough: Secondary | ICD-10-CM

## 2018-09-26 MED ORDER — SULFAMETHOXAZOLE-TRIMETHOPRIM 800-160 MG PO TABS: 1.0000 | ORAL_TABLET | Freq: Two times a day (BID) | ORAL | 0 refills | Status: DC

## 2018-09-26 MED ORDER — HYDROCOD POLST-CPM POLST ER 10-8 MG/5ML PO SUER: 5.0000 mL | Freq: Two times a day (BID) | ORAL | 0 refills | Status: DC | PRN

## 2018-09-26 NOTE — Progress Notes (Signed)
[facility] [place], [place] [address]  Internal MEDICINE  Office Visit Note  Patient Name: Toni [name]  [id number]  [id number]  Date of Service: 10/04/2018  Chief Complaint  Patient presents with  . Sinusitis    post nasal drip, coughing green mucous      HPI Pt is here for a sick visit. She reports 3-4 days of PND, sore throat from drainage, coughing up green mucous.  She reports a low grade fever yesterday.  She woke up sweating last night, and feels better today.  She reports working in her yard last week triggered this episode.  Pt reports she has been taking Flonase, Mucinex, and zyrtec with minimal relief.     Current Medication:  Outpatient Encounter Medications as of 09/26/2018  Medication Sig  . aspirin 81 MG tablet Take 81 mg by mouth daily.  . Calcium Carbonate-Vitamin D (CALCIUM + D PO) Take 1 tablet by mouth daily.   . cetirizine (ZYRTEC) 10 MG tablet Take 10 mg by mouth as needed.   . chlorpheniramine-HYDROcodone (TUSSIONEX PENNKINETIC ER) 10-8 MG/5ML SUER Take 5 mLs by mouth every 12 (twelve) hours as needed for cough.  . cholecalciferol (VITAMIN D) 1000 units tablet Take 1,000 Units by mouth daily.  Mariane Baumgarten Calcium (STOOL SOFTENER PO) Take 1 tablet by mouth daily.  . fenofibrate 54 MG tablet TAKE 1 TABLET BY MOUTH EVERY DAY FOR ELEVATED CHOLESTEROL  . hydrochlorothiazide (HYDRODIURIL) 12.5 MG tablet Take 1 tablet (12.5 mg total) by mouth daily.  . hydrochlorothiazide (MICROZIDE) 12.5 MG capsule TAKE 1 CAPSULE BY MOUTH EVERY DAY  . olmesartan (BENICAR) 20 MG tablet TAKE 1 TABLET BY MOUTH DAILY  . pantoprazole (PROTONIX) 40 MG tablet TAKE 1 TABLET BY MOUTH DAILY FOR REFLUX  . SYNTHROID 112 MCG tablet TAKE 1 TABLET BY MOUTH DAILY . ON SUNDAYS TAKE 1/2 TABLET  . valsartan (DIOVAN) 160 MG tablet Take 160 mg by mouth daily.  . [DISCONTINUED] chlorpheniramine-HYDROcodone (TUSSIONEX PENNKINETIC ER) 10-8 MG/5ML SUER Take 5 mLs by mouth  every 12 (twelve) hours as needed for cough.  . sulfamethoxazole-trimethoprim (BACTRIM DS,SEPTRA DS) 800-160 MG tablet Take 1 tablet by mouth 2 (two) times daily.   Facility-Administered Encounter Medications as of 09/26/2018  Medication  . 0.9 %  sodium chloride infusion      Medical History: Past Medical History:  Diagnosis Date  . Allergic rhinitis   . Allergy   . Aortic atherosclerosis ([place])   . Diabetes mellitus, type 2 (HCC)    no medicines- was pre diabtes- diet controlled   . Diverticulitis   . Essential hypertension   . GERD (gastroesophageal reflux disease)   . Hyperlipidemia   . Hypertension   . Hypothyroidism   . Obesity   . Osteoporosis   . Transient insomnia   . Tubular adenoma of colon 05/2013     Vital Signs: BP 132/89   Pulse 78   Temp 98.6 F (37 C)   Resp 16   Ht 5\' 6"  (1.676 m)   Wt 171 lb (77.6 kg)   SpO2 97%   BMI 27.60 kg/m    Review of Systems  Constitutional: Negative for chills, fatigue and unexpected weight change.  HENT: Positive for postnasal drip, sinus pressure, sinus pain and sore throat. Negative for congestion, rhinorrhea and sneezing.   Eyes: Negative for photophobia, pain and redness.  Respiratory: Positive for cough. Negative for chest tightness and shortness of breath.   Cardiovascular: Negative for chest pain  and palpitations.  Gastrointestinal: Negative for abdominal pain, constipation, diarrhea, nausea and vomiting.  Endocrine: Negative.   Genitourinary: Negative for dysuria and frequency.  Musculoskeletal: Negative for arthralgias, back pain, joint swelling and neck pain.  Skin: Negative for rash.  Allergic/Immunologic: Negative.   Neurological: Negative for tremors and numbness.  Hematological: Negative for adenopathy. Does not bruise/bleed easily.  Psychiatric/Behavioral: Negative for behavioral problems and sleep disturbance. The patient is not nervous/anxious.     Physical Exam Vitals signs and nursing note  reviewed.  Constitutional:      General: She is not in acute distress.    Appearance: She is well-developed. She is not diaphoretic.  HENT:     Head: Normocephalic and atraumatic.     Mouth/Throat:     Pharynx: No oropharyngeal exudate.  Eyes:     Pupils: Pupils are equal, round, and reactive to light.  Neck:     Musculoskeletal: Normal range of motion and neck supple.     Thyroid: No thyromegaly.     Vascular: No JVD.     Trachea: No tracheal deviation.  Cardiovascular:     Rate and Rhythm: Normal rate and regular rhythm.     Heart sounds: Normal heart sounds. No murmur. No friction rub. No gallop.   Pulmonary:     Effort: Pulmonary effort is normal. No respiratory distress.     Breath sounds: Normal breath sounds. No wheezing or rales.  Chest:     Chest wall: No tenderness.  Abdominal:     Palpations: Abdomen is soft.     Tenderness: There is no abdominal tenderness. There is no guarding.  Musculoskeletal: Normal range of motion.  Lymphadenopathy:     Cervical: No cervical adenopathy.  Skin:    General: Skin is warm and dry.  Neurological:     Mental Status: She is alert and oriented to person, place, and time.     Cranial Nerves: No cranial nerve deficit.  Psychiatric:        Behavior: Behavior normal.        Thought Content: Thought content normal.        Judgment: Judgment normal.    Assessment/Plan: 1. Acute non-recurrent maxillary sinusitis Advised patient to take entire course of antibiotics as prescribed with food. Pt should return to clinic in 7-10 days if symptoms fail to improve or new symptoms develop.  - sulfamethoxazole-trimethoprim (BACTRIM DS,SEPTRA DS) 800-160 MG tablet; Take 1 tablet by mouth 2 (two) times daily.  Dispense: 14 tablet; Refill: 0  2. Cough Reviewed risks and possible side effects associated with taking opiates, benzodiazepines and other CNS depressants. Combination of these could cause dizziness and drowsiness. Advised patient not to  drive or operate machinery when taking these medications, as patient's and other's life can be at risk and will have consequences. Patient verbalized understanding in this matter. Dependence and abuse for these drugs will be monitored closely. A Controlled substance policy and procedure is on file which allows [facility] to order a urine drug screen test at any visit. Patient understands and agrees with the plan - chlorpheniramine-HYDROcodone (TUSSIONEX PENNKINETIC ER) 10-8 MG/5ML SUER; Take 5 mLs by mouth every 12 (twelve) hours as needed for cough.  Dispense: 70 mL; Refill: 0  General Counseling: Alesha verbalizes understanding of the findings of todays visit and agrees with plan of treatment. I have discussed any further diagnostic evaluation that may be needed or ordered today. We also reviewed her medications today. she has been encouraged to call  the office with any questions or concerns that should arise related to todays visit.   No orders of the defined types were placed in this encounter.   Meds ordered this encounter  Medications  . sulfamethoxazole-trimethoprim (BACTRIM DS,SEPTRA DS) 800-160 MG tablet    Sig: Take 1 tablet by mouth 2 (two) times daily.    Dispense:  14 tablet    Refill:  0  . chlorpheniramine-HYDROcodone (TUSSIONEX PENNKINETIC ER) 10-8 MG/5ML SUER    Sig: Take 5 mLs by mouth every 12 (twelve) hours as needed for cough.    Dispense:  70 mL    Refill:  0    Time spent: 25 Minutes  This patient was seen by Orson Gear AGNP-C in Collaboration with Dr Lavera Guise as a part of collaborative care agreement.  Kendell Bane AGNP-C Internal Medicine

## 2018-11-02 ENCOUNTER — Other Ambulatory Visit: Payer: Self-pay

## 2018-11-02 MED ORDER — FENOFIBRATE 54 MG PO TABS: ORAL_TABLET | ORAL | 1 refills | Status: DC

## 2019-01-03 ENCOUNTER — Other Ambulatory Visit: Payer: Self-pay

## 2019-01-03 MED ORDER — HYDROCHLOROTHIAZIDE 12.5 MG PO CAPS: ORAL_CAPSULE | ORAL | 1 refills | Status: DC

## 2019-01-09 DIAGNOSIS — Z1231 Encounter for screening mammogram for malignant neoplasm of breast: Secondary | ICD-10-CM | POA: Diagnosis not present

## 2019-01-16 ENCOUNTER — Encounter: Payer: Self-pay | Admitting: Adult Health

## 2019-01-16 ENCOUNTER — Ambulatory Visit (INDEPENDENT_AMBULATORY_CARE_PROVIDER_SITE_OTHER): Payer: PPO | Admitting: Adult Health

## 2019-01-16 ENCOUNTER — Other Ambulatory Visit: Payer: Self-pay

## 2019-01-16 VITALS — BP 152/78 | HR 64 | Temp 98.0°F | Resp 16 | Ht 66.0 in | Wt 170.6 lb

## 2019-01-16 DIAGNOSIS — K5732 Diverticulitis of large intestine without perforation or abscess without bleeding: Secondary | ICD-10-CM

## 2019-01-16 DIAGNOSIS — Z13820 Encounter for screening for osteoporosis: Secondary | ICD-10-CM

## 2019-01-16 DIAGNOSIS — I10 Essential (primary) hypertension: Secondary | ICD-10-CM | POA: Diagnosis not present

## 2019-01-16 MED ORDER — CIPROFLOXACIN HCL 500 MG PO TABS: 500.0000 mg | ORAL_TABLET | Freq: Two times a day (BID) | ORAL | 0 refills | Status: DC

## 2019-01-16 MED ORDER — PREDNISONE 10 MG PO TABS: ORAL_TABLET | ORAL | 0 refills | Status: DC

## 2019-01-16 NOTE — Progress Notes (Signed)
[facility] [place], [place] [address]  Internal MEDICINE  Office Visit Note  Patient Name: Toni [name]  [id number]  [id number]  Date of Service: 01/16/2019  Chief Complaint  Patient presents with  . Diverticulitis    tender around abdominal area  . Fatigue     HPI Pt is here for a sick visit. Pt reports feeling some abdominal discomfort about a week.  She reports she has had some fatigue as well as, some diarrhea and constipation. She reports a history of diverticulitis.        Current Medication:  Outpatient Encounter Medications as of 01/16/2019  Medication Sig  . aspirin 81 MG tablet Take 81 mg by mouth daily.  . Calcium Carbonate-Vitamin D (CALCIUM + D PO) Take 1 tablet by mouth daily.   . cetirizine (ZYRTEC) 10 MG tablet Take 10 mg by mouth as needed.   . cholecalciferol (VITAMIN D) 1000 units tablet Take 1,000 Units by mouth daily.  Mariane Baumgarten Calcium (STOOL SOFTENER PO) Take 1 tablet by mouth daily.  . fenofibrate 54 MG tablet TAKE 1 TABLET BY MOUTH EVERY DAY FOR ELEVATED CHOLESTEROL  . hydrochlorothiazide (HYDRODIURIL) 12.5 MG tablet Take 1 tablet (12.5 mg total) by mouth daily.  . hydrochlorothiazide (MICROZIDE) 12.5 MG capsule TAKE 1 CAPSULE BY MOUTH EVERY DAY  . olmesartan (BENICAR) 20 MG tablet TAKE 1 TABLET BY MOUTH DAILY  . pantoprazole (PROTONIX) 40 MG tablet TAKE 1 TABLET BY MOUTH DAILY FOR REFLUX  . sulfamethoxazole-trimethoprim (BACTRIM DS,SEPTRA DS) 800-160 MG tablet Take 1 tablet by mouth 2 (two) times daily.  Marland Kitchen SYNTHROID 112 MCG tablet TAKE 1 TABLET BY MOUTH DAILY . ON SUNDAYS TAKE 1/2 TABLET  . valsartan (DIOVAN) 160 MG tablet Take 160 mg by mouth daily.  . ciprofloxacin (CIPRO) 500 MG tablet Take 1 tablet (500 mg total) by mouth 2 (two) times daily.  . predniSONE (DELTASONE) 10 MG tablet Use per dose pack  . [DISCONTINUED] chlorpheniramine-HYDROcodone (TUSSIONEX PENNKINETIC ER) 10-8 MG/5ML SUER Take 5 mLs by mouth every  12 (twelve) hours as needed for cough. (Patient not taking: Reported on 01/16/2019)   Facility-Administered Encounter Medications as of 01/16/2019  Medication  . 0.9 %  sodium chloride infusion      Medical History: Past Medical History:  Diagnosis Date  . Allergic rhinitis   . Allergy   . Aortic atherosclerosis ([place])   . Diabetes mellitus, type 2 (HCC)    no medicines- was pre diabtes- diet controlled   . Diverticulitis   . Essential hypertension   . GERD (gastroesophageal reflux disease)   . Hyperlipidemia   . Hypertension   . Hypothyroidism   . Obesity   . Osteoporosis   . Transient insomnia   . Tubular adenoma of colon 05/2013     Vital Signs: BP (!) 152/78   Pulse 64   Temp 98 F (36.7 C)   Resp 16   Ht 5\' 6"  (1.676 m)   Wt 170 lb 9.6 oz (77.4 kg)   SpO2 97%   BMI 27.54 kg/m    Review of Systems  Constitutional: Negative for chills, fatigue and unexpected weight change.  HENT: Negative for congestion, rhinorrhea, sneezing and sore throat.   Eyes: Negative for photophobia, pain and redness.  Respiratory: Negative for cough, chest tightness and shortness of breath.   Cardiovascular: Negative for chest pain and palpitations.  Gastrointestinal: Negative for abdominal pain, constipation, diarrhea, nausea and vomiting.  Endocrine: Negative.   Genitourinary: Negative  for dysuria and frequency.  Musculoskeletal: Negative for arthralgias, back pain, joint swelling and neck pain.  Skin: Negative for rash.  Allergic/Immunologic: Negative.   Neurological: Negative for tremors and numbness.  Hematological: Negative for adenopathy. Does not bruise/bleed easily.  Psychiatric/Behavioral: Negative for behavioral problems and sleep disturbance. The patient is not nervous/anxious.     Physical Exam Vitals signs and nursing note reviewed.  Constitutional:      General: She is not in acute distress.    Appearance: She is well-developed. She is not diaphoretic.  HENT:      Head: Normocephalic and atraumatic.     Mouth/Throat:     Pharynx: No oropharyngeal exudate.  Eyes:     Pupils: Pupils are equal, round, and reactive to light.  Neck:     Musculoskeletal: Normal range of motion and neck supple.     Thyroid: No thyromegaly.     Vascular: No JVD.     Trachea: No tracheal deviation.  Cardiovascular:     Rate and Rhythm: Normal rate and regular rhythm.     Heart sounds: Normal heart sounds. No murmur. No friction rub. No gallop.   Pulmonary:     Effort: Pulmonary effort is normal. No respiratory distress.     Breath sounds: Normal breath sounds. No wheezing or rales.  Chest:     Chest wall: No tenderness.  Abdominal:     Palpations: Abdomen is soft.     Tenderness: There is no abdominal tenderness. There is no guarding.  Musculoskeletal: Normal range of motion.  Lymphadenopathy:     Cervical: No cervical adenopathy.  Skin:    General: Skin is warm and dry.  Neurological:     Mental Status: She is alert and oriented to person, place, and time.     Cranial Nerves: No cranial nerve deficit.  Psychiatric:        Behavior: Behavior normal.        Thought Content: Thought content normal.        Judgment: Judgment normal.    Assessment/Plan: 1. Diverticulitis of colon without hemorrhage Take cipro and prednisone as discussed. Advised patient to take entire course of antibiotics as prescribed with food. Pt should return to clinic in 7-10 days if symptoms fail to improve or new symptoms develop.  - ciprofloxacin (CIPRO) 500 MG tablet; Take 1 tablet (500 mg total) by mouth 2 (two) times daily.  Dispense: 20 tablet; Refill: 0 - predniSONE (DELTASONE) 10 MG tablet; Use per dose pack  Dispense: 21 tablet; Refill: 0  2. Essential hypertension Slightly elevated, likely due to diverticulitis.     3. Screening for osteoporosis - DG Bone Density; Future  General Counseling: bronwyn belasco understanding of the findings of todays visit and agrees with plan  of treatment. I have discussed any further diagnostic evaluation that may be needed or ordered today. We also reviewed her medications today. she has been encouraged to call the office with any questions or concerns that should arise related to todays visit.   No orders of the defined types were placed in this encounter.   Meds ordered this encounter  Medications  . ciprofloxacin (CIPRO) 500 MG tablet    Sig: Take 1 tablet (500 mg total) by mouth 2 (two) times daily.    Dispense:  20 tablet    Refill:  0  . predniSONE (DELTASONE) 10 MG tablet    Sig: Use per dose pack    Dispense:  21 tablet    Refill:  0    Time spent: 20 Minutes  This patient was seen by Orson Gear AGNP-C in Collaboration with Dr Lavera Guise as a part of collaborative care agreement.  Kendell Bane AGNP-C Internal Medicine

## 2019-01-17 DIAGNOSIS — M65331 Trigger finger, right middle finger: Secondary | ICD-10-CM | POA: Diagnosis not present

## 2019-01-17 DIAGNOSIS — M13842 Other specified arthritis, left hand: Secondary | ICD-10-CM | POA: Diagnosis not present

## 2019-01-17 DIAGNOSIS — M79641 Pain in right hand: Secondary | ICD-10-CM | POA: Diagnosis not present

## 2019-01-20 ENCOUNTER — Other Ambulatory Visit: Payer: Self-pay | Admitting: Adult Health

## 2019-01-20 DIAGNOSIS — Z13820 Encounter for screening for osteoporosis: Secondary | ICD-10-CM

## 2019-01-27 ENCOUNTER — Ambulatory Visit
Admission: RE | Admit: 2019-01-27 | Discharge: 2019-01-27 | Disposition: A | Discharge status: Home / Self Care | Payer: PPO | Source: Ambulatory Visit | Attending: Adult Health | Admitting: Adult Health

## 2019-01-27 DIAGNOSIS — Z13820 Encounter for screening for osteoporosis: Secondary | ICD-10-CM | POA: Diagnosis not present

## 2019-01-27 DIAGNOSIS — M8580 Other specified disorders of bone density and structure, unspecified site: Secondary | ICD-10-CM | POA: Insufficient documentation

## 2019-01-27 DIAGNOSIS — M8589 Other specified disorders of bone density and structure, multiple sites: Secondary | ICD-10-CM | POA: Diagnosis not present

## 2019-01-27 DIAGNOSIS — Z780 Asymptomatic menopausal state: Secondary | ICD-10-CM | POA: Diagnosis not present

## 2019-02-21 DIAGNOSIS — M65331 Trigger finger, right middle finger: Secondary | ICD-10-CM | POA: Diagnosis not present

## 2019-02-24 ENCOUNTER — Other Ambulatory Visit: Payer: Self-pay

## 2019-02-24 MED ORDER — SYNTHROID 112 MCG PO TABS: ORAL_TABLET | ORAL | 6 refills | Status: DC

## 2019-03-01 ENCOUNTER — Telehealth: Payer: Self-pay

## 2019-03-01 NOTE — Telephone Encounter (Signed)
PT CALLED STATING THAT SHE HAS CONTACT DERMATITIS UNDER HER CHIN AND ON HER CHEEK BONE NEAR HER EYE. SHE HAS TRIED HYDROCORTISONE CREAM FOR TWO DAYS AND WOULD LIKE TO KNOW IF SHE SHOULD TAKE A PREDNISONE DOSE PACK SHE HAS.  SPOKE WITH ADAM AND LET PT KNOW TO CONTINUE USING HYDROCORTISONE CREAM FOR A FEW MORE DAYS AND IF THAT DOES NOT HELP THEN TO USE PREDNISONE.

## 2019-03-21 ENCOUNTER — Other Ambulatory Visit: Payer: Self-pay | Admitting: Adult Health

## 2019-03-21 MED ORDER — OLMESARTAN MEDOXOMIL 20 MG PO TABS: 20.0000 mg | ORAL_TABLET | Freq: Every day | ORAL | 3 refills | Status: DC

## 2019-05-08 ENCOUNTER — Other Ambulatory Visit: Payer: Self-pay | Admitting: Nurse Practitioner

## 2019-05-08 DIAGNOSIS — H52203 Unspecified astigmatism, bilateral: Secondary | ICD-10-CM | POA: Diagnosis not present

## 2019-05-08 DIAGNOSIS — H524 Presbyopia: Secondary | ICD-10-CM | POA: Diagnosis not present

## 2019-05-08 DIAGNOSIS — Z961 Presence of intraocular lens: Secondary | ICD-10-CM | POA: Diagnosis not present

## 2019-05-08 MED ORDER — FENOFIBRATE 54 MG PO TABS: ORAL_TABLET | ORAL | 1 refills | Status: DC

## 2019-05-09 ENCOUNTER — Ambulatory Visit: Payer: PPO | Admitting: Nurse Practitioner

## 2019-05-10 ENCOUNTER — Encounter: Payer: Self-pay | Admitting: Nurse Practitioner

## 2019-05-10 ENCOUNTER — Other Ambulatory Visit: Payer: Self-pay

## 2019-05-10 ENCOUNTER — Ambulatory Visit (INDEPENDENT_AMBULATORY_CARE_PROVIDER_SITE_OTHER): Payer: PPO | Admitting: Nurse Practitioner

## 2019-05-10 VITALS — BP 150/71 | HR 63 | Temp 97.6°F | Resp 16 | Ht 66.0 in | Wt 172.0 lb

## 2019-05-10 DIAGNOSIS — E890 Postprocedural hypothyroidism: Secondary | ICD-10-CM

## 2019-05-10 DIAGNOSIS — Z0001 Encounter for general adult medical examination with abnormal findings: Secondary | ICD-10-CM | POA: Diagnosis not present

## 2019-05-10 DIAGNOSIS — I10 Essential (primary) hypertension: Secondary | ICD-10-CM | POA: Diagnosis not present

## 2019-05-10 DIAGNOSIS — M5432 Sciatica, left side: Secondary | ICD-10-CM

## 2019-05-10 DIAGNOSIS — R300 Dysuria: Secondary | ICD-10-CM | POA: Diagnosis not present

## 2019-05-10 MED ORDER — METHYLPREDNISOLONE 4 MG PO TBPK: ORAL_TABLET | ORAL | 0 refills | Status: DC

## 2019-05-10 NOTE — Progress Notes (Signed)
[facility] [place], [place] [address]  Internal MEDICINE  Office Visit Note  Patient Name: Toni [name]  [id number]  [id number]  Date of Service: 05/24/2019   Pt is here for routine health maintenance examination  Chief Complaint  Patient presents with  . Annual Exam    go over bone density scan, would like to send over to [facility] if any changes   . Hypertension  . Hyperlipidemia  . Hypothyroidism  . Hip Pain    left side   . Quality Metric Gaps    pna vacc and mammogram      The patient is here for health maintenance exam. Today, she is complaining of left hip pain. Hurts in the top area of the left hip. This is described as a burning type pain. Hurts more when she is standing for a long period or when she is lying in bed and turns onto her left hip. Helps when she is bending up the left knee and stretching the back of the hip. She is due to have routine, fasting blood work. She had mammogram done in [place] in 12/2018. She had bone density test in 01/2019 which did show generalized osteopenia. Bond density has improved in lumbar spine and pelvis, but worsened slightly in the femur. Will continue with calcium and vitamin d every day and participate in low impact exercise. Repeat study in two years.    Current Medication: Outpatient Encounter Medications as of 05/10/2019  Medication Sig  . aspirin 81 MG tablet Take 81 mg by mouth daily.  . Calcium Carbonate-Vitamin D (CALCIUM + D PO) Take 1 tablet by mouth daily.   . cetirizine (ZYRTEC) 10 MG tablet Take 10 mg by mouth as needed.   . cholecalciferol (VITAMIN D) 1000 units tablet Take 1,000 Units by mouth daily.  Mariane Baumgarten Calcium (STOOL SOFTENER PO) Take 1 tablet by mouth daily.  . fenofibrate 54 MG tablet TAKE 1 TABLET BY MOUTH EVERY DAY FOR ELEVATED CHOLESTEROL  . hydrochlorothiazide (HYDRODIURIL) 12.5 MG tablet Take 1 tablet (12.5 mg total) by mouth daily.  . hydrochlorothiazide (MICROZIDE) 12.5  MG capsule TAKE 1 CAPSULE BY MOUTH EVERY DAY  . olmesartan (BENICAR) 20 MG tablet Take 1 tablet (20 mg total) by mouth daily.  . pantoprazole (PROTONIX) 40 MG tablet TAKE 1 TABLET BY MOUTH DAILY FOR REFLUX  . SYNTHROID 112 MCG tablet TAKE 1 TABLET BY MOUTH DAILY . ON SUNDAYS TAKE 1/2 TABLET  . valsartan (DIOVAN) 160 MG tablet Take 160 mg by mouth daily.  . methylPREDNISolone (MEDROL) 4 MG TBPK tablet Take by mouth as directed for 6 days  . [DISCONTINUED] ciprofloxacin (CIPRO) 500 MG tablet Take 1 tablet (500 mg total) by mouth 2 (two) times daily. (Patient not taking: Reported on 05/10/2019)  . [DISCONTINUED] predniSONE (DELTASONE) 10 MG tablet Use per dose pack (Patient not taking: Reported on 05/10/2019)  . [DISCONTINUED] sulfamethoxazole-trimethoprim (BACTRIM DS,SEPTRA DS) 800-160 MG tablet Take 1 tablet by mouth 2 (two) times daily. (Patient not taking: Reported on 05/10/2019)   Facility-Administered Encounter Medications as of 05/10/2019  Medication  . 0.9 %  sodium chloride infusion    Surgical History: Past Surgical History:  Procedure Laterality Date  . APPENDECTOMY    . BUNIONECTOMY     right foot  . [facility]   . CATARACT EXTRACTION, BILATERAL Left 10/20/2017  . CATARACT EXTRACTION, BILATERAL Right 10/27/2017  . CHOLECYSTECTOMY    . COLONOSCOPY    .  HAMMER TOE SURGERY  12/2012   left toe  . PARTIAL HYSTERECTOMY    . POLYPECTOMY    . ROBOTIC ASSISTED SALPINGO OOPHERECTOMY     right ovary first removed then left ovary removed years later  . TONSILLECTOMY  age 73  . TOTAL THYROIDECTOMY  09/2011    Medical History: Past Medical History:  Diagnosis Date  . Allergic rhinitis   . Allergy   . Aortic atherosclerosis ([place])   . Diabetes mellitus, type 2 (HCC)    no medicines- was pre diabtes- diet controlled   . Diverticulitis   . Essential hypertension   . GERD (gastroesophageal reflux disease)   . Hyperlipidemia   . Hypertension   .  Hypothyroidism   . Obesity   . Osteoporosis   . Transient insomnia   . Tubular adenoma of colon 05/2013    Family History: Family History  Problem Relation Age of Onset  . Throat cancer Father   . Esophageal cancer Father   . Colon cancer Maternal Aunt   . Colon cancer Maternal Uncle   . Colon polyps Maternal Aunt   . Colon polyps Maternal Uncle   . Heart disease Maternal Uncle   . Stomach cancer Maternal Uncle   . Prostate cancer Maternal Uncle   . Breast cancer Cousin   . Diabetes Neg Hx   . Kidney disease Neg Hx   . Gallbladder disease Neg Hx   . Rectal cancer Neg Hx       Review of Systems  Constitutional: Negative for activity change, appetite change, chills, fatigue, fever and unexpected weight change.  HENT: Negative for congestion, ear pain, postnasal drip, rhinorrhea, sinus pressure, sinus pain, sore throat and voice change.   Respiratory: Negative for cough and wheezing.   Cardiovascular: Negative for chest pain and palpitations.  Gastrointestinal: Negative for constipation, diarrhea, nausea and vomiting.  Endocrine: Negative for cold intolerance, heat intolerance, polydipsia and polyuria.       Due to have thyroid panel drawn.   Genitourinary: Negative for dysuria, flank pain and hematuria.  Musculoskeletal: Positive for arthralgias and myalgias. Negative for back pain.       Left hip pain which is described as burning. Radiates into the back of the upper left leg.   Allergic/Immunologic: Positive for environmental allergies.  Neurological: Positive for headaches.  Hematological: Negative for adenopathy.  Psychiatric/Behavioral: Negative for agitation and dysphoric mood. The patient is not nervous/anxious.    Today's Vitals   05/10/19 1007  BP: (!) 150/71  Pulse: 63  Resp: 16  Temp: 97.6 F (36.4 C)  SpO2: 96%  Weight: 172 lb (78 kg)  Height: 5\' 6"  (1.676 m)   Body mass index is 27.76 kg/m.  Physical Exam Vitals signs and nursing note reviewed.   Constitutional:      General: She is not in acute distress.    Appearance: Normal appearance. She is well-developed. She is not diaphoretic.  HENT:     Head: Normocephalic and atraumatic.     Nose: Nose normal.     Mouth/Throat:     Pharynx: No oropharyngeal exudate.  Eyes:     Pupils: Pupils are equal, round, and reactive to light.  Neck:     Musculoskeletal: Normal range of motion and neck supple.     Thyroid: No thyromegaly.     Vascular: No JVD.     Trachea: No tracheal deviation.  Cardiovascular:     Rate and Rhythm: Normal rate and regular rhythm.  Heart sounds: Normal heart sounds. No murmur. No friction rub. No gallop.   Pulmonary:     Effort: Pulmonary effort is normal. No respiratory distress.     Breath sounds: No wheezing or rales.  Chest:     Chest wall: No tenderness.     Breasts:        Right: Normal. No swelling, bleeding, inverted nipple, mass, nipple discharge, skin change or tenderness.        Left: Normal. No swelling, bleeding, inverted nipple, mass, nipple discharge, skin change or tenderness.  Abdominal:     General: Bowel sounds are normal.     Palpations: Abdomen is soft.  Musculoskeletal: Normal range of motion.  Lymphadenopathy:     Cervical: No cervical adenopathy.  Skin:    General: Skin is warm and dry.  Neurological:     Mental Status: She is alert and oriented to person, place, and time.     Cranial Nerves: No cranial nerve deficit.  Psychiatric:        Behavior: Behavior normal.        Thought Content: Thought content normal.        Judgment: Judgment normal.    Depression screen [facility] 2/9 05/10/2019 09/26/2018 05/02/2018 01/13/2018  Decreased Interest 0 0 0 0  Down, Depressed, Hopeless 0 0 0 0  PHQ - 2 Score 0 0 0 0    Functional Status Survey: Is the patient deaf or have difficulty hearing?: No Does the patient have difficulty seeing, even when wearing glasses/contacts?: No Does the patient have difficulty concentrating,  remembering, or making decisions?: No Does the patient have difficulty walking or climbing stairs?: No Does the patient have difficulty dressing or bathing?: No Does the patient have difficulty doing errands alone such as visiting a doctor's office or shopping?: No  MMSE - [place] Exam 05/10/2019 05/02/2018  Orientation to time 5 5  Orientation to Place 5 5  Registration 3 3  Attention/ Calculation 5 5  Recall 3 3  Language- name 2 objects 2 2  Language- repeat 1 1  Language- follow 3 step command 3 3  Language- read & follow direction 1 1  Write a sentence 1 1  Copy design 1 1  Total score 30 30    Fall Risk  05/10/2019 09/26/2018 05/02/2018 01/13/2018  Falls in the past year? 0 0 No No      LABS: Recent Results (from the past 2160 hour(s))  UA/M w/rflx Culture, Routine     Status: None   Collection Time: 05/10/19 12:00 AM   Specimen: Urine   URINE  Result Value Ref Range   Specific Gravity, UA 1.007 1.005 - 1.030   pH, UA 5.0 5.0 - 7.5   Color, UA Yellow Yellow   Appearance Ur Clear Clear   Leukocytes,UA Negative Negative   Protein,UA Negative Negative/Trace   Glucose, UA Negative Negative   Ketones, UA Negative Negative   RBC, UA Negative Negative   Bilirubin, UA Negative Negative   Urobilinogen, Ur 0.2 0.2 - 1.0 mg/dL   Nitrite, UA Negative Negative   Microscopic Examination Comment     Comment: Microscopic follows if indicated.   Microscopic Examination See below:     Comment: Microscopic was indicated and was performed.   Urinalysis Reflex Comment     Comment: This specimen will not reflex to a Urine Culture.  Microscopic Examination     Status: None   Collection Time: 05/10/19 12:00 AM   URINE  Result Value Ref Range   WBC, UA 0-5 0 - 5 /hpf   RBC 0-2 0 - 2 /hpf   Epithelial Cells (non renal) None seen 0 - 10 /hpf   Casts None seen None seen /lpf   Bacteria, UA None seen None seen/Few  Comprehensive metabolic panel     Status: Abnormal    Collection Time: 05/12/19  8:11 AM  Result Value Ref Range   Glucose 156 (H) 65 - 99 mg/dL   BUN 19 8 - 27 mg/dL   Creatinine, Ser 0.86 0.57 - 1.00 mg/dL   GFR calc non Af Amer 68 >59 mL/min/1.73   GFR calc Af Amer 79 >59 mL/min/1.73   BUN/Creatinine Ratio 22 12 - 28   Sodium 142 134 - 144 mmol/L   Potassium 4.1 3.5 - 5.2 mmol/L   Chloride 103 96 - 106 mmol/L   CO2 24 20 - 29 mmol/L   Calcium 10.1 8.7 - 10.3 mg/dL   Total Protein 7.1 6.0 - 8.5 g/dL   Albumin 4.6 3.7 - 4.7 g/dL   Globulin, Total 2.5 1.5 - 4.5 g/dL   Albumin/Globulin Ratio 1.8 1.2 - 2.2   Bilirubin Total 0.6 0.0 - 1.2 mg/dL   Alkaline Phosphatase 59 39 - 117 IU/L   AST 22 0 - 40 IU/L   ALT 41 (H) 0 - 32 IU/L  CBC     Status: None   Collection Time: 05/12/19  8:11 AM  Result Value Ref Range   WBC 8.7 3.4 - 10.8 x10E3/uL   RBC 5.08 3.77 - 5.28 x10E6/uL   Hemoglobin 15.5 11.1 - 15.9 g/dL   Hematocrit 45.1 34.0 - 46.6 %   MCV 89 79 - 97 fL   MCH 30.5 26.6 - 33.0 pg   MCHC 34.4 31.5 - 35.7 g/dL   RDW 12.5 11.7 - 15.4 %   Platelets 278 150 - 450 x10E3/uL  Lipid Panel w/o Chol/HDL Ratio     Status: Abnormal   Collection Time: 05/12/19  8:11 AM  Result Value Ref Range   Cholesterol, Total 354 (H) 100 - 199 mg/dL   Triglycerides 217 (H) 0 - 149 mg/dL   HDL 52 >39 mg/dL   VLDL Cholesterol Cal 45 (H) 5 - 40 mg/dL   LDL Chol Calc (NIH) 257 (H) 0 - 99 mg/dL  Hgb A1c w/o eAG     Status: Abnormal   Collection Time: 05/12/19  8:11 AM  Result Value Ref Range   Hgb A1c MFr Bld 6.1 (H) 4.8 - 5.6 %    Comment:          Prediabetes: 5.7 - 6.4          Diabetes: >6.4          Glycemic control for adults with diabetes: <7.0   T4, free     Status: None   Collection Time: 05/12/19  8:11 AM  Result Value Ref Range   Free T4 1.61 0.82 - 1.77 ng/dL  TSH     Status: Abnormal   Collection Time: 05/12/19  8:11 AM  Result Value Ref Range   TSH 0.232 (L) 0.450 - 4.500 uIU/mL  VITAMIN D 25 Hydroxy (Vit-D Deficiency, Fractures)      Status: Abnormal   Collection Time: 05/12/19  8:11 AM  Result Value Ref Range   Vit D, 25-Hydroxy 20.4 (L) 30.0 - 100.0 ng/mL    Comment: Vitamin D deficiency has been defined by the Institute of Medicine and an Endocrine  Society practice guideline as a level of serum 25-OH vitamin D less than 20 ng/mL (1,2). The Endocrine Society went on to further define vitamin D insufficiency as a level between 21 and 29 ng/mL (2). 1. IOM (Institute of Medicine). 2010. Dietary reference    intakes for calcium and D. [place]: The    [employer]. 2. Holick MF, Binkley Lovejoy, Bischoff-Ferrari HA, et al.    Evaluation, treatment, and prevention of vitamin D    deficiency: an Endocrine Society clinical practice    guideline. JCEM. 2011 Jul; 96(7):1911-30.    Assessment/Plan: 1. Encounter for general adult medical examination with abnormal findings Annual health maintenance exam today.   2. Essential hypertension Stable. Continue bp medication as prescribed   3. Postoperative hypothyroidism Check thyroid panel and adjust levothyroxine as indicated.   4. Left sided sciatica Trial medrol dose pack. Take as directed for 6 days.  - methylPREDNISolone (MEDROL) 4 MG TBPK tablet; Take by mouth as directed for 6 days  Dispense: 21 tablet; Refill: 0  5. Dysuria - UA/M w/rflx Culture, Routine  General Counseling: lailonie mumpower understanding of the findings of todays visit and agrees with plan of treatment. I have discussed any further diagnostic evaluation that may be needed or ordered today. We also reviewed her medications today. she has been encouraged to call the office with any questions or concerns that should arise related to todays visit.    Counseling:  This patient was seen by Leretha Pol FNP Collaboration with Dr Lavera Guise as a part of collaborative care agreement  Orders Placed This Encounter  Procedures  . Microscopic Examination  . UA/M w/rflx Culture,  Routine    Meds ordered this encounter  Medications  . methylPREDNISolone (MEDROL) 4 MG TBPK tablet    Sig: Take by mouth as directed for 6 days    Dispense:  21 tablet    Refill:  0    Order Specific Question:   Supervising Provider    Answer:   Lavera Guise [id number]    Time spent: [place], MD  Internal Medicine

## 2019-05-11 LAB — UA/M W/RFLX CULTURE, ROUTINE
Bilirubin, UA: NEGATIVE
Glucose, UA: NEGATIVE
Ketones, UA: NEGATIVE
Leukocytes,UA: NEGATIVE
Nitrite, UA: NEGATIVE
Protein,UA: NEGATIVE
RBC, UA: NEGATIVE
Specific Gravity, UA: 1.007 (ref 1.005–1.030)
Urobilinogen, Ur: 0.2 mg/dL (ref 0.2–1.0)
pH, UA: 5 (ref 5.0–7.5)

## 2019-05-11 LAB — MICROSCOPIC EXAMINATION
Bacteria, UA: NONE SEEN
Casts: NONE SEEN /lpf
Epithelial Cells (non renal): NONE SEEN /hpf (ref 0–10)

## 2019-05-12 ENCOUNTER — Other Ambulatory Visit: Payer: Self-pay | Admitting: Nurse Practitioner

## 2019-05-12 DIAGNOSIS — E782 Mixed hyperlipidemia: Secondary | ICD-10-CM | POA: Diagnosis not present

## 2019-05-12 DIAGNOSIS — E039 Hypothyroidism, unspecified: Secondary | ICD-10-CM | POA: Diagnosis not present

## 2019-05-12 DIAGNOSIS — R7301 Impaired fasting glucose: Secondary | ICD-10-CM | POA: Diagnosis not present

## 2019-05-12 DIAGNOSIS — E559 Vitamin D deficiency, unspecified: Secondary | ICD-10-CM | POA: Diagnosis not present

## 2019-05-12 DIAGNOSIS — Z0001 Encounter for general adult medical examination with abnormal findings: Secondary | ICD-10-CM | POA: Diagnosis not present

## 2019-05-13 LAB — COMPREHENSIVE METABOLIC PANEL
ALT: 41 IU/L — ABNORMAL HIGH (ref 0–32)
AST: 22 IU/L (ref 0–40)
Albumin/Globulin Ratio: 1.8 (ref 1.2–2.2)
Albumin: 4.6 g/dL (ref 3.7–4.7)
Alkaline Phosphatase: 59 IU/L (ref 39–117)
BUN/Creatinine Ratio: 22 (ref 12–28)
BUN: 19 mg/dL (ref 8–27)
Bilirubin Total: 0.6 mg/dL (ref 0.0–1.2)
CO2: 24 mmol/L (ref 20–29)
Calcium: 10.1 mg/dL (ref 8.7–10.3)
Chloride: 103 mmol/L (ref 96–106)
Creatinine, Ser: 0.86 mg/dL (ref 0.57–1.00)
GFR calc Af Amer: 79 mL/min/{1.73_m2} (ref >59)
GFR calc non Af Amer: 68 mL/min/{1.73_m2} (ref >59)
Globulin, Total: 2.5 g/dL (ref 1.5–4.5)
Glucose: 156 mg/dL — ABNORMAL HIGH (ref 65–99)
Potassium: 4.1 mmol/L (ref 3.5–5.2)
Sodium: 142 mmol/L (ref 134–144)
Total Protein: 7.1 g/dL (ref 6.0–8.5)

## 2019-05-13 LAB — LIPID PANEL W/O CHOL/HDL RATIO
Cholesterol, Total: 354 mg/dL — ABNORMAL HIGH (ref 100–199)
HDL: 52 mg/dL (ref >39)
LDL Chol Calc (NIH): 257 mg/dL — ABNORMAL HIGH (ref 0–99)
Triglycerides: 217 mg/dL — ABNORMAL HIGH (ref 0–149)
VLDL Cholesterol Cal: 45 mg/dL — ABNORMAL HIGH (ref 5–40)

## 2019-05-13 LAB — CBC
Hematocrit: 45.1 % (ref 34.0–46.6)
Hemoglobin: 15.5 g/dL (ref 11.1–15.9)
MCH: 30.5 pg (ref 26.6–33.0)
MCHC: 34.4 g/dL (ref 31.5–35.7)
MCV: 89 fL (ref 79–97)
Platelets: 278 10*3/uL (ref 150–450)
RBC: 5.08 x10E6/uL (ref 3.77–5.28)
RDW: 12.5 % (ref 11.7–15.4)
WBC: 8.7 10*3/uL (ref 3.4–10.8)

## 2019-05-13 LAB — TSH: TSH: 0.232 u[IU]/mL — ABNORMAL LOW (ref 0.450–4.500)

## 2019-05-13 LAB — HGB A1C W/O EAG: Hgb A1c MFr Bld: 6.1 % — ABNORMAL HIGH (ref 4.8–5.6)

## 2019-05-13 LAB — T4, FREE: Free T4: 1.61 ng/dL (ref 0.82–1.77)

## 2019-05-13 LAB — VITAMIN D 25 HYDROXY (VIT D DEFICIENCY, FRACTURES): Vit D, 25-Hydroxy: 20.4 ng/mL — ABNORMAL LOW (ref 30.0–100.0)

## 2019-05-16 ENCOUNTER — Telehealth: Payer: Self-pay

## 2019-05-16 NOTE — Progress Notes (Signed)
Hey. Please let the patient know that cholesterol panel was moderately elevated. Has she ever tried Zetia to help lower cholesterol? I know all statins are out of the question for her.

## 2019-05-16 NOTE — Telephone Encounter (Signed)
-----   Message from Ronnell Freshwater, NP sent at 05/16/2019  8:22 AM EST ----- Hey. Please let the patient know that cholesterol panel was moderately elevated. Has she ever tried Zetia to help lower cholesterol? I know all statins are out of the question for her.

## 2019-05-17 ENCOUNTER — Other Ambulatory Visit: Payer: Self-pay | Admitting: Nurse Practitioner

## 2019-05-17 DIAGNOSIS — E782 Mixed hyperlipidemia: Secondary | ICD-10-CM

## 2019-05-17 MED ORDER — EZETIMIBE 10 MG PO TABS: 10.0000 mg | ORAL_TABLET | Freq: Every day | ORAL | 3 refills | Status: DC

## 2019-05-17 NOTE — Progress Notes (Signed)
Moderate, general elevation of cholesterol panel. Will do trial of zetia 10mg  every evening along with fenofibrate. Recheck lipids and CMP in about three months. Adjust as indicated.

## 2019-05-17 NOTE — Telephone Encounter (Signed)
Pt advised  Chol is elevated we send zetia to [facility] and we can recheck labs in 3 months

## 2019-05-17 NOTE — Telephone Encounter (Signed)
I looked in her chart and IMS chart. I can't see that we tried zetia, but we did try every statin that there is and all have caused her arthritis or elevated liver enzymes. I sent new prescription to [facility] [address]. We will need to recheck the lipid panel and cmp with liver functions in about 3 months. Thanks.

## 2019-05-24 DIAGNOSIS — M5432 Sciatica, left side: Secondary | ICD-10-CM | POA: Insufficient documentation

## 2019-06-12 ENCOUNTER — Ambulatory Visit (INDEPENDENT_AMBULATORY_CARE_PROVIDER_SITE_OTHER): Payer: PPO | Admitting: Adult Health

## 2019-06-12 ENCOUNTER — Encounter: Payer: Self-pay | Admitting: Adult Health

## 2019-06-12 ENCOUNTER — Other Ambulatory Visit: Payer: Self-pay

## 2019-06-12 DIAGNOSIS — R05 Cough: Secondary | ICD-10-CM

## 2019-06-12 DIAGNOSIS — J988 Other specified respiratory disorders: Secondary | ICD-10-CM

## 2019-06-12 DIAGNOSIS — I10 Essential (primary) hypertension: Secondary | ICD-10-CM

## 2019-06-12 DIAGNOSIS — R059 Cough, unspecified: Secondary | ICD-10-CM

## 2019-06-12 MED ORDER — HYDROCOD POLST-CPM POLST ER 10-8 MG/5ML PO SUER: 5.0000 mL | Freq: Two times a day (BID) | ORAL | 0 refills | Status: DC | PRN

## 2019-06-12 MED ORDER — AZITHROMYCIN 250 MG PO TABS: ORAL_TABLET | ORAL | 0 refills | Status: DC

## 2019-06-12 MED ORDER — PREDNISONE 10 MG PO TABS: ORAL_TABLET | ORAL | 0 refills | Status: DC

## 2019-06-12 NOTE — Progress Notes (Addendum)
[facility] [place], [place] [address]  Internal MEDICINE  Telephone Visit  Patient Name: Toni [name]  [id number]  FC:[id number]  Date of Service: 07/03/2019  I connected with the patient at 145 by telephone and verified the patients identity using two identifiers.   I discussed the limitations, risks, security and privacy concerns of performing an evaluation and management service by telephone and the availability of in person appointments. I also discussed with the patient that there may be a patient responsible charge related to the service.  The patient expressed understanding and agrees to proceed.    Chief Complaint  Patient presents with  . Telephone Assessment    covid positive  . Telephone Screen  . Fatigue  . Fever  . Cough    HPI  Pt reports she had a headache off and on since Saturday with, cough, fatigue.  She reports feeling hot, and she feels like she had a fever.  She also has had chills intermittently. She reports feeling a little better than she did Saturday. Denies any other symptoms, but she feels weak and fatigued.      Current Medication: Outpatient Encounter Medications as of 06/12/2019  Medication Sig  . aspirin 81 MG tablet Take 81 mg by mouth daily.  . Calcium Carbonate-Vitamin D (CALCIUM + D PO) Take 1 tablet by mouth daily.   . cetirizine (ZYRTEC) 10 MG tablet Take 10 mg by mouth as needed.   . cholecalciferol (VITAMIN D) 1000 units tablet Take 1,000 Units by mouth daily.  Mariane Baumgarten Calcium (STOOL SOFTENER PO) Take 1 tablet by mouth daily.  Marland Kitchen ezetimibe (ZETIA) 10 MG tablet Take 1 tablet (10 mg total) by mouth daily.  . fenofibrate 54 MG tablet TAKE 1 TABLET BY MOUTH EVERY DAY FOR ELEVATED CHOLESTEROL  . hydrochlorothiazide (HYDRODIURIL) 12.5 MG tablet Take 1 tablet (12.5 mg total) by mouth daily.  . hydrochlorothiazide (MICROZIDE) 12.5 MG capsule TAKE 1 CAPSULE BY MOUTH EVERY DAY  . methylPREDNISolone (MEDROL) 4 MG TBPK  tablet Take by mouth as directed for 6 days  . olmesartan (BENICAR) 20 MG tablet Take 1 tablet (20 mg total) by mouth daily.  . valsartan (DIOVAN) 160 MG tablet Take 160 mg by mouth daily.  . [DISCONTINUED] pantoprazole (PROTONIX) 40 MG tablet TAKE 1 TABLET BY MOUTH DAILY FOR REFLUX  . [DISCONTINUED] SYNTHROID 112 MCG tablet TAKE 1 TABLET BY MOUTH DAILY . ON SUNDAYS TAKE 1/2 TABLET  . azithromycin (ZITHROMAX) 250 MG tablet Take as directed  . chlorpheniramine-HYDROcodone (TUSSIONEX PENNKINETIC ER) 10-8 MG/5ML SUER Take 5 mLs by mouth every 12 (twelve) hours as needed for cough.  . predniSONE (DELTASONE) 10 MG tablet Use per dose pack   Facility-Administered Encounter Medications as of 06/12/2019  Medication  . 0.9 %  sodium chloride infusion    Surgical History: Past Surgical History:  Procedure Laterality Date  . APPENDECTOMY    . BUNIONECTOMY     right foot  . [facility]   . CATARACT EXTRACTION, BILATERAL Left 10/20/2017  . CATARACT EXTRACTION, BILATERAL Right 10/27/2017  . CHOLECYSTECTOMY    . COLONOSCOPY    . HAMMER TOE SURGERY  12/2012   left toe  . PARTIAL HYSTERECTOMY    . POLYPECTOMY    . ROBOTIC ASSISTED SALPINGO OOPHERECTOMY     right ovary first removed then left ovary removed years later  . TONSILLECTOMY  age 26  . TOTAL THYROIDECTOMY  09/2011  Medical History: Past Medical History:  Diagnosis Date  . Allergic rhinitis   . Allergy   . Aortic atherosclerosis ([place])   . Diabetes mellitus, type 2 (HCC)    no medicines- was pre diabtes- diet controlled   . Diverticulitis   . Essential hypertension   . GERD (gastroesophageal reflux disease)   . Hyperlipidemia   . Hypertension   . Hypothyroidism   . Obesity   . Osteoporosis   . Transient insomnia   . Tubular adenoma of colon 05/2013    Family History: Family History  Problem Relation Age of Onset  . Throat cancer Father   . Esophageal cancer Father   . Colon cancer  Maternal Aunt   . Colon cancer Maternal Uncle   . Colon polyps Maternal Aunt   . Colon polyps Maternal Uncle   . Heart disease Maternal Uncle   . Stomach cancer Maternal Uncle   . Prostate cancer Maternal Uncle   . Breast cancer Cousin   . Diabetes Neg Hx   . Kidney disease Neg Hx   . Gallbladder disease Neg Hx   . Rectal cancer Neg Hx     Social History   Socioeconomic History  . Marital status: Married    Spouse name: Not on file  . Number of children: 2  . Years of education: Not on file  . Highest education level: Not on file  Occupational History  . Occupation: Retired  Tobacco Use  . Smoking status: Former Smoker    Packs/day: 1.00    Years: 30.00    Pack years: 30.00    Types: Cigarettes    Quit date: 07/06/1998    Years since quitting: 21.0  . Smokeless tobacco: Never Used  Substance and Sexual Activity  . Alcohol use: No    Alcohol/week: 0.0 standard drinks  . Drug use: No  . Sexual activity: Not on file  Other Topics Concern  . Not on file  Social History Narrative  . Not on file   Social Determinants of Health   Financial Resource Strain:   . Difficulty of Paying Living Expenses: Not on file  Food Insecurity:   . Worried About Charity fundraiser in the Last Year: Not on file  . Ran Out of Food in the Last Year: Not on file  Transportation Needs:   . Lack of Transportation (Medical): Not on file  . Lack of Transportation (Non-Medical): Not on file  Physical Activity:   . Days of Exercise per Week: Not on file  . Minutes of Exercise per Session: Not on file  Stress:   . Feeling of Stress : Not on file  Social Connections:   . Frequency of Communication with Friends and Family: Not on file  . Frequency of Social Gatherings with Friends and Family: Not on file  . Attends Religious Services: Not on file  . Active Member of Clubs or Organizations: Not on file  . Attends Archivist Meetings: Not on file  . Marital Status: Not on file   Intimate Partner Violence:   . Fear of Current or Ex-Partner: Not on file  . Emotionally Abused: Not on file  . Physically Abused: Not on file  . Sexually Abused: Not on file      Review of Systems  Constitutional: Positive for fatigue. Negative for chills and unexpected weight change.  HENT: Negative for congestion, rhinorrhea, sneezing and sore throat.   Eyes: Negative for photophobia, pain and redness.  Respiratory: Positive  for cough. Negative for shortness of breath.        Congestion  Cardiovascular: Negative for chest pain and palpitations.  Gastrointestinal: Negative for abdominal pain, constipation, diarrhea, nausea and vomiting.  Endocrine: Negative.   Genitourinary: Negative for dysuria and frequency.  Musculoskeletal: Negative for arthralgias, back pain, joint swelling and neck pain.  Skin: Negative for rash.  Allergic/Immunologic: Negative.   Neurological: Positive for headaches. Negative for tremors and numbness.  Hematological: Negative for adenopathy. Does not bruise/bleed easily.  Psychiatric/Behavioral: Negative for behavioral problems and sleep disturbance. The patient is not nervous/anxious.     Vital Signs: There were no vitals taken for this visit.   Observation/Objective: NAD noted.     Assessment/Plan: 1. Respiratory infection Advised patient to take entire course of antibiotics as prescribed with food. Pt should return to clinic in 7-10 days if symptoms fail to improve or new symptoms develop.  - azithromycin (ZITHROMAX) 250 MG tablet; Take as directed  Dispense: 6 tablet; Refill: 0 - predniSONE (DELTASONE) 10 MG tablet; Use per dose pack  Dispense: 21 tablet; Refill: 0  2. Cough Use Tussionex as directed. Reviewed risks and possible side effects associated with taking opiates, benzodiazepines and other CNS depressants. Combination of these could cause dizziness and drowsiness. Advised patient not to drive or operate machinery when taking these  medications, as patient's and other's life can be at risk and will have consequences. Patient verbalized understanding in this matter. Dependence and abuse for these drugs will be monitored closely. A Controlled substance policy and procedure is on file which allows [facility] to order a urine drug screen test at any visit. Patient understands and agrees with the plan - chlorpheniramine-HYDROcodone (TUSSIONEX PENNKINETIC ER) 10-8 MG/5ML SUER; Take 5 mLs by mouth every 12 (twelve) hours as needed for cough.  Dispense: 70 mL; Refill: 0  3. Essential hypertension Controlled, continue present management.  General Counseling: elberta calia understanding of the findings of today's phone visit and agrees with plan of treatment. I have discussed any further diagnostic evaluation that may be needed or ordered today. We also reviewed her medications today. she has been encouraged to call the office with any questions or concerns that should arise related to todays visit.  Meds ordered this encounter  Medications  . azithromycin (ZITHROMAX) 250 MG tablet    Sig: Take as directed    Dispense:  6 tablet    Refill:  0  . predniSONE (DELTASONE) 10 MG tablet    Sig: Use per dose pack    Dispense:  21 tablet    Refill:  0  . chlorpheniramine-HYDROcodone (TUSSIONEX PENNKINETIC ER) 10-8 MG/5ML SUER    Sig: Take 5 mLs by mouth every 12 (twelve) hours as needed for cough.    Dispense:  70 mL    Refill:  0    Time spent:15 Minutes  Orson Gear AGNP-C Internal medicine

## 2019-06-22 ENCOUNTER — Telehealth: Payer: Self-pay

## 2019-06-22 NOTE — Telephone Encounter (Signed)
Confirmed appointment with patient. klh °

## 2019-06-23 ENCOUNTER — Other Ambulatory Visit: Payer: Self-pay

## 2019-06-23 MED ORDER — PANTOPRAZOLE SODIUM 40 MG PO TBEC: 40.0000 mg | DELAYED_RELEASE_TABLET | Freq: Every day | ORAL | 3 refills | Status: DC

## 2019-06-26 ENCOUNTER — Encounter: Payer: Self-pay | Admitting: Adult Health

## 2019-06-26 ENCOUNTER — Ambulatory Visit (INDEPENDENT_AMBULATORY_CARE_PROVIDER_SITE_OTHER): Payer: PPO | Admitting: Adult Health

## 2019-06-26 ENCOUNTER — Other Ambulatory Visit: Payer: Self-pay

## 2019-06-26 VITALS — BP 136/61 | HR 71 | Resp 16 | Ht 66.5 in | Wt 165.0 lb

## 2019-06-26 DIAGNOSIS — E039 Hypothyroidism, unspecified: Secondary | ICD-10-CM

## 2019-06-26 DIAGNOSIS — U071 COVID-19: Secondary | ICD-10-CM | POA: Diagnosis not present

## 2019-06-26 DIAGNOSIS — I10 Essential (primary) hypertension: Secondary | ICD-10-CM | POA: Diagnosis not present

## 2019-06-26 DIAGNOSIS — R059 Cough, unspecified: Secondary | ICD-10-CM

## 2019-06-26 DIAGNOSIS — R05 Cough: Secondary | ICD-10-CM | POA: Diagnosis not present

## 2019-06-26 MED ORDER — SYNTHROID 112 MCG PO TABS: ORAL_TABLET | ORAL | 0 refills | Status: DC

## 2019-06-26 NOTE — Progress Notes (Signed)
[facility] [place], [place] [address]  Internal MEDICINE  Office Visit Note  Patient Name: Toni [name]  [id number]  [id number]  Date of Service: 06/26/2019  Chief Complaint  Patient presents with  . Medical Management of Chronic Issues    2 week follow up covid     HPI  Pt is here for 2 week follow up for COVID.  She reports she is doing well.  She continues to have a small amount of fatigue.  She reports feeling well overall.  Occasionally she has a dry cough, but it is very manageable and rare.  Her husband has not faired as well, but he is recovering also.    Current Medication: Outpatient Encounter Medications as of 06/26/2019  Medication Sig  . aspirin 81 MG tablet Take 81 mg by mouth daily.  Marland Kitchen azithromycin (ZITHROMAX) 250 MG tablet Take as directed  . Calcium Carbonate-Vitamin D (CALCIUM + D PO) Take 1 tablet by mouth daily.   . cetirizine (ZYRTEC) 10 MG tablet Take 10 mg by mouth as needed.   . chlorpheniramine-HYDROcodone (TUSSIONEX PENNKINETIC ER) 10-8 MG/5ML SUER Take 5 mLs by mouth every 12 (twelve) hours as needed for cough.  . cholecalciferol (VITAMIN D) 1000 units tablet Take 1,000 Units by mouth daily.  Mariane Baumgarten Calcium (STOOL SOFTENER PO) Take 1 tablet by mouth daily.  Marland Kitchen ezetimibe (ZETIA) 10 MG tablet Take 1 tablet (10 mg total) by mouth daily.  . fenofibrate 54 MG tablet TAKE 1 TABLET BY MOUTH EVERY DAY FOR ELEVATED CHOLESTEROL  . hydrochlorothiazide (HYDRODIURIL) 12.5 MG tablet Take 1 tablet (12.5 mg total) by mouth daily.  . hydrochlorothiazide (MICROZIDE) 12.5 MG capsule TAKE 1 CAPSULE BY MOUTH EVERY DAY  . methylPREDNISolone (MEDROL) 4 MG TBPK tablet Take by mouth as directed for 6 days  . olmesartan (BENICAR) 20 MG tablet Take 1 tablet (20 mg total) by mouth daily.  . pantoprazole (PROTONIX) 40 MG tablet Take 1 tablet (40 mg total) by mouth daily. t  . predniSONE (DELTASONE) 10 MG tablet Use per dose pack  . SYNTHROID 112  MCG tablet TAKE 1 TABLET BY MOUTH DAILY . ON SUNDAYS TAKE 1/2 TABLET  . valsartan (DIOVAN) 160 MG tablet Take 160 mg by mouth daily.  . [DISCONTINUED] SYNTHROID 112 MCG tablet TAKE 1 TABLET BY MOUTH DAILY . ON SUNDAYS TAKE 1/2 TABLET   Facility-Administered Encounter Medications as of 06/26/2019  Medication  . 0.9 %  sodium chloride infusion    Surgical History: Past Surgical History:  Procedure Laterality Date  . APPENDECTOMY    . BUNIONECTOMY     right foot  . [facility]   . CATARACT EXTRACTION, BILATERAL Left 10/20/2017  . CATARACT EXTRACTION, BILATERAL Right 10/27/2017  . CHOLECYSTECTOMY    . COLONOSCOPY    . HAMMER TOE SURGERY  12/2012   left toe  . PARTIAL HYSTERECTOMY    . POLYPECTOMY    . ROBOTIC ASSISTED SALPINGO OOPHERECTOMY     right ovary first removed then left ovary removed years later  . TONSILLECTOMY  age 62  . TOTAL THYROIDECTOMY  09/2011    Medical History: Past Medical History:  Diagnosis Date  . Allergic rhinitis   . Allergy   . Aortic atherosclerosis ([place])   . Diabetes mellitus, type 2 (HCC)    no medicines- was pre diabtes- diet controlled   . Diverticulitis   . Essential hypertension   . GERD (gastroesophageal reflux  disease)   . Hyperlipidemia   . Hypertension   . Hypothyroidism   . Obesity   . Osteoporosis   . Transient insomnia   . Tubular adenoma of colon 05/2013    Family History: Family History  Problem Relation Age of Onset  . Throat cancer Father   . Esophageal cancer Father   . Colon cancer Maternal Aunt   . Colon cancer Maternal Uncle   . Colon polyps Maternal Aunt   . Colon polyps Maternal Uncle   . Heart disease Maternal Uncle   . Stomach cancer Maternal Uncle   . Prostate cancer Maternal Uncle   . Breast cancer Cousin   . Diabetes Neg Hx   . Kidney disease Neg Hx   . Gallbladder disease Neg Hx   . Rectal cancer Neg Hx     Social History   Socioeconomic History  . Marital  status: Married    Spouse name: Not on file  . Number of children: 2  . Years of education: Not on file  . Highest education level: Not on file  Occupational History  . Occupation: Retired  Tobacco Use  . Smoking status: Former Smoker    Packs/day: 1.00    Years: 30.00    Pack years: 30.00    Types: Cigarettes    Quit date: 07/06/1998    Years since quitting: 20.9  . Smokeless tobacco: Never Used  Substance and Sexual Activity  . Alcohol use: No    Alcohol/week: 0.0 standard drinks  . Drug use: No  . Sexual activity: Not on file  Other Topics Concern  . Not on file  Social History Narrative  . Not on file   Social Determinants of Health   Financial Resource Strain:   . Difficulty of Paying Living Expenses: Not on file  Food Insecurity:   . Worried About Charity fundraiser in the Last Year: Not on file  . Ran Out of Food in the Last Year: Not on file  Transportation Needs:   . Lack of Transportation (Medical): Not on file  . Lack of Transportation (Non-Medical): Not on file  Physical Activity:   . Days of Exercise per Week: Not on file  . Minutes of Exercise per Session: Not on file  Stress:   . Feeling of Stress : Not on file  Social Connections:   . Frequency of Communication with Friends and Family: Not on file  . Frequency of Social Gatherings with Friends and Family: Not on file  . Attends Religious Services: Not on file  . Active Member of Clubs or Organizations: Not on file  . Attends Archivist Meetings: Not on file  . Marital Status: Not on file  Intimate Partner Violence:   . Fear of Current or Ex-Partner: Not on file  . Emotionally Abused: Not on file  . Physically Abused: Not on file  . Sexually Abused: Not on file      Review of Systems  Constitutional: Negative for chills, fatigue and unexpected weight change.  HENT: Negative for congestion, rhinorrhea, sneezing and sore throat.   Eyes: Negative for photophobia, pain and redness.   Respiratory: Negative for cough, chest tightness and shortness of breath.   Cardiovascular: Negative for chest pain and palpitations.  Gastrointestinal: Negative for abdominal pain, constipation, diarrhea, nausea and vomiting.  Endocrine: Negative.   Genitourinary: Negative for dysuria and frequency.  Musculoskeletal: Negative for arthralgias, back pain, joint swelling and neck pain.  Skin: Negative for rash.  Allergic/Immunologic: Negative.   Neurological: Negative for tremors and numbness.  Hematological: Negative for adenopathy. Does not bruise/bleed easily.  Psychiatric/Behavioral: Negative for behavioral problems and sleep disturbance. The patient is not nervous/anxious.     Vital Signs: BP 136/61   Pulse 71   Resp 16   Ht 5' 6.5" (1.689 m)   Wt 165 lb (74.8 kg)   SpO2 95%   BMI 26.23 kg/m    Physical Exam Vitals and nursing note reviewed.  Constitutional:      General: She is not in acute distress.    Appearance: She is well-developed. She is not diaphoretic.  HENT:     Head: Normocephalic and atraumatic.     Mouth/Throat:     Pharynx: No oropharyngeal exudate.  Eyes:     Pupils: Pupils are equal, round, and reactive to light.  Neck:     Thyroid: No thyromegaly.     Vascular: No JVD.     Trachea: No tracheal deviation.  Cardiovascular:     Rate and Rhythm: Normal rate and regular rhythm.     Heart sounds: Normal heart sounds. No murmur. No friction rub. No gallop.   Pulmonary:     Effort: Pulmonary effort is normal. No respiratory distress.     Breath sounds: Normal breath sounds. No wheezing or rales.  Chest:     Chest wall: No tenderness.  Abdominal:     Palpations: Abdomen is soft.     Tenderness: There is no abdominal tenderness. There is no guarding.  Musculoskeletal:        General: Normal range of motion.     Cervical back: Normal range of motion and neck supple.  Lymphadenopathy:     Cervical: No cervical adenopathy.  Skin:    General: Skin is  warm and dry.  Neurological:     Mental Status: She is alert and oriented to person, place, and time.     Cranial Nerves: No cranial nerve deficit.  Psychiatric:        Behavior: Behavior normal.        Thought Content: Thought content normal.        Judgment: Judgment normal.    Assessment/Plan: 1. COVID-19 virus infection Pt has recovered, continue to rest, drink plenty of water and return to clinic PRN.   2. Cough Mild, pt reports this is her baseline.   3. Essential hypertension Stable, continue present management.   4. Hypothyroidism, unspecified type Refilled patients synthroid as her mail in order is lost in transition.  - SYNTHROID 112 MCG tablet; TAKE 1 TABLET BY MOUTH DAILY . ON SUNDAYS TAKE 1/2 TABLET  Dispense: 30 tablet; Refill: 0 General Counseling: Toni [name] understanding of the findings of todays visit and agrees with plan of treatment. I have discussed any further diagnostic evaluation that may be needed or ordered today. We also reviewed her medications today. she has been encouraged to call the office with any questions or concerns that should arise related to todays visit.    No orders of the defined types were placed in this encounter.   Meds ordered this encounter  Medications  . SYNTHROID 112 MCG tablet    Sig: TAKE 1 TABLET BY MOUTH DAILY . ON SUNDAYS TAKE 1/2 TABLET    Dispense:  30 tablet    Refill:  0    Time spent: 25 Minutes   This patient was seen by Orson Gear AGNP-C in Collaboration with Dr Lavera Guise as a part of collaborative  care agreement     Kendell Bane AGNP-C Internal medicine

## 2019-07-19 ENCOUNTER — Other Ambulatory Visit: Payer: Self-pay | Admitting: Adult Health

## 2019-07-19 DIAGNOSIS — E039 Hypothyroidism, unspecified: Secondary | ICD-10-CM

## 2019-08-23 ENCOUNTER — Other Ambulatory Visit: Payer: Self-pay

## 2019-08-23 MED ORDER — HYDROCHLOROTHIAZIDE 12.5 MG PO TABS: 12.5000 mg | ORAL_TABLET | Freq: Every day | ORAL | 0 refills | Status: DC

## 2019-08-25 ENCOUNTER — Other Ambulatory Visit: Payer: Self-pay

## 2019-08-25 MED ORDER — HYDROCHLOROTHIAZIDE 12.5 MG PO CAPS: ORAL_CAPSULE | ORAL | 1 refills | Status: DC

## 2019-08-28 ENCOUNTER — Other Ambulatory Visit: Payer: Self-pay

## 2019-08-28 DIAGNOSIS — E782 Mixed hyperlipidemia: Secondary | ICD-10-CM

## 2019-08-28 MED ORDER — EZETIMIBE 10 MG PO TABS: 10.0000 mg | ORAL_TABLET | Freq: Every day | ORAL | 3 refills | Status: DC

## 2019-09-11 DIAGNOSIS — M545 Low back pain: Secondary | ICD-10-CM | POA: Diagnosis not present

## 2019-09-11 DIAGNOSIS — M25552 Pain in left hip: Secondary | ICD-10-CM | POA: Diagnosis not present

## 2019-09-22 DIAGNOSIS — M545 Low back pain: Secondary | ICD-10-CM | POA: Diagnosis not present

## 2019-10-26 DIAGNOSIS — M5416 Radiculopathy, lumbar region: Secondary | ICD-10-CM | POA: Diagnosis not present

## 2019-10-27 ENCOUNTER — Other Ambulatory Visit: Payer: Self-pay

## 2019-10-27 MED ORDER — FENOFIBRATE 54 MG PO TABS: ORAL_TABLET | ORAL | 1 refills | Status: DC

## 2019-11-02 DIAGNOSIS — M47816 Spondylosis without myelopathy or radiculopathy, lumbar region: Secondary | ICD-10-CM | POA: Diagnosis not present

## 2019-11-02 DIAGNOSIS — G5702 Lesion of sciatic nerve, left lower limb: Secondary | ICD-10-CM | POA: Diagnosis not present

## 2019-11-02 DIAGNOSIS — M461 Sacroiliitis, not elsewhere classified: Secondary | ICD-10-CM | POA: Diagnosis not present

## 2019-11-07 ENCOUNTER — Ambulatory Visit: Payer: PPO | Admitting: Nurse Practitioner

## 2019-11-15 ENCOUNTER — Telehealth: Payer: Self-pay

## 2019-11-15 NOTE — Telephone Encounter (Signed)
Confirmed and screened for 11-20-19 ov.

## 2019-11-20 ENCOUNTER — Encounter: Payer: Self-pay | Admitting: Nurse Practitioner

## 2019-11-20 ENCOUNTER — Other Ambulatory Visit: Payer: Self-pay

## 2019-11-20 ENCOUNTER — Ambulatory Visit (INDEPENDENT_AMBULATORY_CARE_PROVIDER_SITE_OTHER): Payer: PPO | Admitting: Nurse Practitioner

## 2019-11-20 VITALS — BP 130/70 | HR 76 | Temp 97.3°F | Resp 16 | Ht 66.0 in | Wt 170.0 lb

## 2019-11-20 DIAGNOSIS — M5416 Radiculopathy, lumbar region: Secondary | ICD-10-CM

## 2019-11-20 DIAGNOSIS — E039 Hypothyroidism, unspecified: Secondary | ICD-10-CM | POA: Diagnosis not present

## 2019-11-20 DIAGNOSIS — I10 Essential (primary) hypertension: Secondary | ICD-10-CM

## 2019-11-20 DIAGNOSIS — R7303 Prediabetes: Secondary | ICD-10-CM | POA: Diagnosis not present

## 2019-11-20 LAB — POCT GLYCOSYLATED HEMOGLOBIN (HGB A1C): Hemoglobin A1C: 6.4 % — AB (ref 4.0–5.6)

## 2019-11-20 MED ORDER — HYDROCODONE-ACETAMINOPHEN 5-325 MG PO TABS: 1.0000 | ORAL_TABLET | Freq: Four times a day (QID) | ORAL | 0 refills | Status: DC | PRN

## 2019-11-20 MED ORDER — SYNTHROID 112 MCG PO TABS: ORAL_TABLET | ORAL | 3 refills | Status: DC

## 2019-11-20 MED ORDER — ETODOLAC 400 MG PO TABS: 400.0000 mg | ORAL_TABLET | Freq: Two times a day (BID) | ORAL | 1 refills | Status: DC

## 2019-11-20 MED ORDER — AMLODIPINE BESYLATE 5 MG PO TABS: 5.0000 mg | ORAL_TABLET | Freq: Every day | ORAL | 3 refills | Status: DC

## 2019-11-20 NOTE — Progress Notes (Signed)
[facility] [place], [place] [address]  Internal MEDICINE  Office Visit Note  Patient Name: Toni [name]  [id number]  [id number]  Date of Service: 11/25/2019  Chief Complaint  Patient presents with  . Diabetes  . Hyperlipidemia  . Hypertension    The patient is here for routine follow up. She has been seeing neruosurgeon for severe lower back pain. She had MRI of the spine showing herniated disc in the L4/L5 region. She had cortisone injection into the lumbar spine which did not help. She has not asked for any pain medications and is regretting not asking. She is supposed to take two young grandchildren to [facility] this weekend. She will go for nerve conduction study on Thursday, but does not return for follow up or another injection until December 12, 2019. She states that she can lay down and get a little relief. She can also put chest to her legs which really helps. She remains physically active. She states that she does not even know what to ask for regarding pain medication.  Started on zetia at her last visit. Due to have lipid panel and liver functions retested since starting this medication. Chronic dry cough. Was changed from diovan to benicar due to the dry and persistent cough. States that this has not helped so much. Husband states that she coughs nearly all the time. She does take zyrtec seasonally for allergies, but this does not seem to be making a difference.       Current Medication: Outpatient Encounter Medications as of 11/20/2019  Medication Sig  . aspirin 81 MG tablet Take 81 mg by mouth daily.  . Calcium Carbonate-Vitamin D (CALCIUM + D PO) Take 1 tablet by mouth daily.   . cetirizine (ZYRTEC) 10 MG tablet Take 10 mg by mouth as needed.   . cholecalciferol (VITAMIN D) 1000 units tablet Take 1,000 Units by mouth daily.  Mariane Baumgarten Calcium (STOOL SOFTENER PO) Take 1 tablet by mouth daily.  Marland Kitchen ezetimibe (ZETIA) 10 MG tablet Take 1 tablet (10  mg total) by mouth daily.  . fenofibrate 54 MG tablet TAKE 1 TABLET BY MOUTH EVERY DAY FOR ELEVATED CHOLESTEROL  . hydrochlorothiazide (MICROZIDE) 12.5 MG capsule TAKE 1 CAPSULE BY MOUTH EVERY DAY  . olmesartan (BENICAR) 20 MG tablet Take 1 tablet (20 mg total) by mouth daily.  . pantoprazole (PROTONIX) 40 MG tablet Take 1 tablet (40 mg total) by mouth daily. t  . SYNTHROID 112 MCG tablet Take 1 tablet po QAM  . valsartan (DIOVAN) 160 MG tablet Take 160 mg by mouth daily.  . [DISCONTINUED] predniSONE (DELTASONE) 10 MG tablet Use per dose pack  . [DISCONTINUED] SYNTHROID 112 MCG tablet TAKE 1 TABLET BY MOUTH DAILY . ON SUNDAYS TAKE 1/2 TABLET  . amLODipine (NORVASC) 5 MG tablet Take 1 tablet (5 mg total) by mouth daily.  Marland Kitchen etodolac (LODINE) 400 MG tablet Take 1 tablet (400 mg total) by mouth 2 (two) times daily.  Marland Kitchen HYDROcodone-acetaminophen (NORCO/VICODIN) 5-325 MG tablet Take 1 tablet by mouth every 6 (six) hours as needed for moderate pain.  . [DISCONTINUED] azithromycin (ZITHROMAX) 250 MG tablet Take as directed  . [DISCONTINUED] chlorpheniramine-HYDROcodone (TUSSIONEX PENNKINETIC ER) 10-8 MG/5ML SUER Take 5 mLs by mouth every 12 (twelve) hours as needed for cough. (Patient not taking: Reported on 11/20/2019)  . [DISCONTINUED] methylPREDNISolone (MEDROL) 4 MG TBPK tablet Take by mouth as directed for 6 days (Patient not taking: Reported on 11/20/2019)   Facility-Administered Encounter  Medications as of 11/20/2019  Medication  . 0.9 %  sodium chloride infusion    Surgical History: Past Surgical History:  Procedure Laterality Date  . APPENDECTOMY    . BUNIONECTOMY     right foot  . [facility]   . CATARACT EXTRACTION, BILATERAL Left 10/20/2017  . CATARACT EXTRACTION, BILATERAL Right 10/27/2017  . CHOLECYSTECTOMY    . COLONOSCOPY    . HAMMER TOE SURGERY  12/2012   left toe  . PARTIAL HYSTERECTOMY    . POLYPECTOMY    . ROBOTIC ASSISTED SALPINGO  OOPHERECTOMY     right ovary first removed then left ovary removed years later  . TONSILLECTOMY  age 43  . TOTAL THYROIDECTOMY  09/2011    Medical History: Past Medical History:  Diagnosis Date  . Allergic rhinitis   . Allergy   . Aortic atherosclerosis ([place])   . Diabetes mellitus, type 2 (HCC)    no medicines- was pre diabtes- diet controlled   . Diverticulitis   . Essential hypertension   . GERD (gastroesophageal reflux disease)   . Hyperlipidemia   . Hypertension   . Hypothyroidism   . Obesity   . Osteoporosis   . Transient insomnia   . Tubular adenoma of colon 05/2013    Family History: Family History  Problem Relation Age of Onset  . Throat cancer Father   . Esophageal cancer Father   . Colon cancer Maternal Aunt   . Colon cancer Maternal Uncle   . Colon polyps Maternal Aunt   . Colon polyps Maternal Uncle   . Heart disease Maternal Uncle   . Stomach cancer Maternal Uncle   . Prostate cancer Maternal Uncle   . Breast cancer Cousin   . Diabetes Neg Hx   . Kidney disease Neg Hx   . Gallbladder disease Neg Hx   . Rectal cancer Neg Hx     Social History   Socioeconomic History  . Marital status: Married    Spouse name: Not on file  . Number of children: 2  . Years of education: Not on file  . Highest education level: Not on file  Occupational History  . Occupation: Retired  Tobacco Use  . Smoking status: Former Smoker    Packs/day: 1.00    Years: 30.00    Pack years: 30.00    Types: Cigarettes    Quit date: 07/06/1998    Years since quitting: 21.4  . Smokeless tobacco: Never Used  Substance and Sexual Activity  . Alcohol use: No    Alcohol/week: 0.0 standard drinks  . Drug use: No  . Sexual activity: Not on file  Other Topics Concern  . Not on file  Social History Narrative  . Not on file   Social Determinants of Health   Financial Resource Strain:   . Difficulty of Paying Living Expenses:   Food Insecurity:   . Worried About Sales executive in the Last Year:   . Arboriculturist in the Last Year:   Transportation Needs:   . Film/video editor (Medical):   Marland Kitchen Lack of Transportation (Non-Medical):   Physical Activity:   . Days of Exercise per Week:   . Minutes of Exercise per Session:   Stress:   . Feeling of Stress :   Social Connections:   . Frequency of Communication with Friends and Family:   . Frequency of Social Gatherings with Friends and Family:   .  Attends Religious Services:   . Active Member of Clubs or Organizations:   . Attends Archivist Meetings:   Marland Kitchen Marital Status:   Intimate Partner Violence:   . Fear of Current or Ex-Partner:   . Emotionally Abused:   Marland Kitchen Physically Abused:   . Sexually Abused:       Review of Systems  Constitutional: Negative for activity change, chills, fatigue and unexpected weight change.  HENT: Negative for congestion, rhinorrhea, sneezing and sore throat.   Respiratory: Negative for cough, chest tightness, shortness of breath and wheezing.   Cardiovascular: Negative for chest pain and palpitations.  Gastrointestinal: Negative for abdominal pain, constipation, diarrhea, nausea and vomiting.  Endocrine: Negative for cold intolerance, heat intolerance, polydipsia and polyuria.       Due to have thyroid panel checked.   Musculoskeletal: Positive for arthralgias, back pain and myalgias. Negative for joint swelling and neck pain.  Skin: Negative for rash.  Allergic/Immunologic: Positive for environmental allergies.  Neurological: Negative for dizziness, tremors, numbness and headaches.  Hematological: Negative for adenopathy. Does not bruise/bleed easily.  Psychiatric/Behavioral: Negative for behavioral problems and sleep disturbance. The patient is not nervous/anxious.    Today's Vitals   11/20/19 0945  BP: 130/70  Pulse: 76  Resp: 16  Temp: (!) 97.3 F (36.3 C)  SpO2: 98%  Weight: 170 lb (77.1 kg)  Height: 5\' 6"  (1.676 m)   Body mass index is 27.44  kg/m.  Physical Exam Vitals and nursing note reviewed.  Constitutional:      General: She is not in acute distress.    Appearance: Normal appearance. She is well-developed. She is not diaphoretic.  HENT:     Head: Normocephalic and atraumatic.     Mouth/Throat:     Pharynx: No oropharyngeal exudate.  Eyes:     Conjunctiva/sclera: Conjunctivae normal.     Pupils: Pupils are equal, round, and reactive to light.  Neck:     Thyroid: No thyromegaly.     Vascular: No carotid bruit or JVD.     Trachea: No tracheal deviation.  Cardiovascular:     Rate and Rhythm: Normal rate and regular rhythm.     Heart sounds: Normal heart sounds. No murmur. No friction rub. No gallop.   Pulmonary:     Effort: Pulmonary effort is normal. No respiratory distress.     Breath sounds: Normal breath sounds. No wheezing or rales.  Chest:     Chest wall: No tenderness.  Abdominal:     Palpations: Abdomen is soft.  Musculoskeletal:        General: Normal range of motion.     Cervical back: Normal range of motion and neck supple.     Comments: Low back pain is moderate to severe and worse with sitting or standing for long periods of time. No visible or palpable abnormalities or deformities noted at this time.   Lymphadenopathy:     Cervical: No cervical adenopathy.  Skin:    General: Skin is warm and dry.  Neurological:     Mental Status: She is alert and oriented to person, place, and time.     Cranial Nerves: No cranial nerve deficit.  Psychiatric:        Behavior: Behavior normal.        Thought Content: Thought content normal.        Judgment: Judgment normal.    Assessment/Plan: 1. Pre-diabetes - POCT HgB A1C 6.4 today. Continue to control through diet and exercise. Check Hgba1c  at next visit.   2. Essential hypertension Take amlopdipine 5mg  daily. Monitor blood pressure closely.  - amLODipine (NORVASC) 5 MG tablet; Take 1 tablet (5 mg total) by mouth daily.  Dispense: 30 tablet; Refill:  3  3. Hypothyroidism, unspecified type Check thyroid panel and adjust levothyroxine as indicated.  - SYNTHROID 112 MCG tablet; Take 1 tablet po QAM  Dispense: 90 tablet; Refill: 3  4. 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 Short term prescription given for hydrocodone/APAP 5/325mg  up to four times daily as needed for pain. Prescription for #20 tablets given today. Reviewed risks and possible side effects associated with taking opiates, benzodiazepines and other CNS depressants. Combination of these could cause dizziness and drowsiness. Advised patient not to drive or operate machinery when taking these medications, as patient's and other's life can be at risk and will have consequences. Patient verbalized understanding in this matter. Dependence and abuse for these drugs will be monitored closely. A Controlled substance policy and procedure is on file which allows [facility] to order a urine drug screen test at any visit. Patient understands and agrees with the plan. - etodolac (LODINE) 400 MG tablet; Take 1 tablet (400 mg total) by mouth 2 (two) times daily.  Dispense: 45 tablet; Refill: 1 - HYDROcodone-acetaminophen (NORCO/VICODIN) 5-325 MG tablet; Take 1 tablet by mouth every 6 (six) hours as needed for moderate pain.  Dispense: 20 tablet; Refill: 0  General Counseling: shamyah sobers understanding of the findings of todays visit and agrees with plan of treatment. I have discussed any further diagnostic evaluation that may be needed or ordered today. We also reviewed her medications today. she has been encouraged to call the office with any questions or concerns that should arise related to todays visit.  Hypertension Counseling:   The following hypertensive lifestyle modification were recommended and discussed:  1. Limiting alcohol intake to less than 1 oz/day of ethanol:(24 oz of beer or 8  oz of wine or 2 oz of 100-proof whiskey). 2. Take baby ASA 81 mg daily. 3. Importance of regular aerobic exercise and losing weight. 4. Reduce dietary saturated fat and cholesterol intake for overall cardiovascular health. 5. Maintaining adequate dietary potassium, calcium, and magnesium intake. 6. Regular monitoring of the blood pressure. 7. Reduce sodium intake to less than 100 mmol/day (less than 2.3 gm of sodium or less than 6 gm of sodium choride)   This patient was seen by Florin with Dr Lavera Guise as a part of collaborative care agreement  Orders Placed This Encounter  Procedures  . POCT HgB A1C    Meds ordered this encounter  Medications  . etodolac (LODINE) 400 MG tablet    Sig: Take 1 tablet (400 mg total) by mouth 2 (two) times daily.    Dispense:  45 tablet    Refill:  1    Order Specific Question:   Supervising Provider    Answer:   Lavera Guise [id number]  . HYDROcodone-acetaminophen (NORCO/VICODIN) 5-325 MG tablet    Sig: Take 1 tablet by mouth every 6 (six) hours as needed for moderate pain.    Dispense:  20 tablet    Refill:  0    Order Specific Question:   Supervising Provider    Answer:   Lavera Guise [id number]  . amLODipine (NORVASC) 5 MG tablet    Sig: Take 1 tablet (5 mg total) by mouth daily.  Dispense:  30 tablet    Refill:  3    Please d/c olmesartan due to chronic, dry cough.    Order Specific Question:   Supervising Provider    Answer:   Lavera Guise [id number]  . SYNTHROID 112 MCG tablet    Sig: Take 1 tablet po QAM    Dispense:  90 tablet    Refill:  3    Order Specific Question:   Supervising Provider    Answer:   Lavera Guise [id number]    Total time spent: 30 Minutes   Time spent includes review of chart, medications, test results, and follow up plan with the patient.      Dr Lavera Guise Internal medicine

## 2019-11-24 ENCOUNTER — Other Ambulatory Visit: Payer: Self-pay | Admitting: Nurse Practitioner

## 2019-11-24 DIAGNOSIS — R944 Abnormal results of kidney function studies: Secondary | ICD-10-CM | POA: Diagnosis not present

## 2019-11-24 DIAGNOSIS — E782 Mixed hyperlipidemia: Secondary | ICD-10-CM | POA: Diagnosis not present

## 2019-11-25 DIAGNOSIS — R7303 Prediabetes: Secondary | ICD-10-CM | POA: Insufficient documentation

## 2019-11-25 DIAGNOSIS — M5416 Radiculopathy, lumbar region: Secondary | ICD-10-CM | POA: Insufficient documentation

## 2019-11-25 LAB — COMPREHENSIVE METABOLIC PANEL
ALT: 32 IU/L (ref 0–32)
AST: 24 IU/L (ref 0–40)
Albumin/Globulin Ratio: 2 (ref 1.2–2.2)
Albumin: 4.4 g/dL (ref 3.7–4.7)
Alkaline Phosphatase: 54 IU/L (ref 48–121)
BUN/Creatinine Ratio: 26 (ref 12–28)
BUN: 23 mg/dL (ref 8–27)
Bilirubin Total: 1.1 mg/dL (ref 0.0–1.2)
CO2: 24 mmol/L (ref 20–29)
Calcium: 9.8 mg/dL (ref 8.7–10.3)
Chloride: 104 mmol/L (ref 96–106)
Creatinine, Ser: 0.89 mg/dL (ref 0.57–1.00)
GFR calc Af Amer: 75 mL/min/{1.73_m2} (ref >59)
GFR calc non Af Amer: 65 mL/min/{1.73_m2} (ref >59)
Globulin, Total: 2.2 g/dL (ref 1.5–4.5)
Glucose: 135 mg/dL — ABNORMAL HIGH (ref 65–99)
Potassium: 4.5 mmol/L (ref 3.5–5.2)
Sodium: 142 mmol/L (ref 134–144)
Total Protein: 6.6 g/dL (ref 6.0–8.5)

## 2019-11-25 LAB — LIPID PANEL WITH LDL/HDL RATIO
Cholesterol, Total: 272 mg/dL — ABNORMAL HIGH (ref 100–199)
HDL: 47 mg/dL (ref >39)
LDL Chol Calc (NIH): 188 mg/dL — ABNORMAL HIGH (ref 0–99)
LDL/HDL Ratio: 4 ratio — ABNORMAL HIGH (ref 0.0–3.2)
Triglycerides: 194 mg/dL — ABNORMAL HIGH (ref 0–149)
VLDL Cholesterol Cal: 37 mg/dL (ref 5–40)

## 2019-12-05 ENCOUNTER — Other Ambulatory Visit: Payer: Self-pay

## 2019-12-05 DIAGNOSIS — E782 Mixed hyperlipidemia: Secondary | ICD-10-CM

## 2019-12-05 MED ORDER — EZETIMIBE 10 MG PO TABS: 10.0000 mg | ORAL_TABLET | Freq: Every day | ORAL | 3 refills | Status: DC

## 2019-12-06 NOTE — Progress Notes (Signed)
Lipid panel not ideal, but much improved. Discuss at visit 01/19/2020

## 2019-12-12 DIAGNOSIS — M461 Sacroiliitis, not elsewhere classified: Secondary | ICD-10-CM | POA: Diagnosis not present

## 2019-12-13 ENCOUNTER — Other Ambulatory Visit: Payer: Self-pay | Admitting: Nurse Practitioner

## 2019-12-13 ENCOUNTER — Telehealth: Payer: Self-pay

## 2019-12-13 DIAGNOSIS — I10 Essential (primary) hypertension: Secondary | ICD-10-CM

## 2019-12-13 MED ORDER — DILTIAZEM HCL ER COATED BEADS 120 MG PO CP24: 120.0000 mg | ORAL_CAPSULE | Freq: Every day | ORAL | 3 refills | Status: DC

## 2019-12-13 NOTE — Telephone Encounter (Signed)
Pt.notified

## 2019-12-13 NOTE — Telephone Encounter (Signed)
Amlodipine 5mg  caused patient to have moderate swelling of feet and ankle.s d/c. Will try diltiazem CR 120mg  capsules. Take daily. Monitor blood pressure closely. Sent to [facility] [address].

## 2019-12-13 NOTE — Progress Notes (Signed)
Amlodipine 5mg  caused patient to have moderate swelling of feet and ankle.s d/c. Will try diltiazem CR 120mg  capsules. Take daily. Monitor blood pressure closely. Sent to [facility] [address].

## 2019-12-15 DIAGNOSIS — M79605 Pain in left leg: Secondary | ICD-10-CM | POA: Diagnosis not present

## 2019-12-15 DIAGNOSIS — M5126 Other intervertebral disc displacement, lumbar region: Secondary | ICD-10-CM | POA: Diagnosis not present

## 2020-01-04 DIAGNOSIS — I10 Essential (primary) hypertension: Secondary | ICD-10-CM | POA: Diagnosis not present

## 2020-01-04 DIAGNOSIS — Z6827 Body mass index (BMI) 27.0-27.9, adult: Secondary | ICD-10-CM | POA: Diagnosis not present

## 2020-01-04 DIAGNOSIS — M461 Sacroiliitis, not elsewhere classified: Secondary | ICD-10-CM | POA: Diagnosis not present

## 2020-01-15 DIAGNOSIS — Z1231 Encounter for screening mammogram for malignant neoplasm of breast: Secondary | ICD-10-CM | POA: Diagnosis not present

## 2020-01-17 ENCOUNTER — Telehealth: Payer: Self-pay

## 2020-01-17 NOTE — Telephone Encounter (Signed)
Confirmed and screened for 01-19-20 ov. 

## 2020-01-19 ENCOUNTER — Ambulatory Visit (INDEPENDENT_AMBULATORY_CARE_PROVIDER_SITE_OTHER): Payer: PPO | Admitting: Nurse Practitioner

## 2020-01-19 ENCOUNTER — Encounter: Payer: Self-pay | Admitting: Nurse Practitioner

## 2020-01-19 VITALS — BP 152/68 | HR 66 | Temp 97.5°F | Resp 16 | Ht 66.0 in | Wt 166.6 lb

## 2020-01-19 DIAGNOSIS — I10 Essential (primary) hypertension: Secondary | ICD-10-CM | POA: Diagnosis not present

## 2020-01-19 DIAGNOSIS — R05 Cough: Secondary | ICD-10-CM | POA: Diagnosis not present

## 2020-01-19 DIAGNOSIS — K5732 Diverticulitis of large intestine without perforation or abscess without bleeding: Secondary | ICD-10-CM | POA: Diagnosis not present

## 2020-01-19 DIAGNOSIS — E782 Mixed hyperlipidemia: Secondary | ICD-10-CM

## 2020-01-19 DIAGNOSIS — R059 Cough, unspecified: Secondary | ICD-10-CM

## 2020-01-19 MED ORDER — CIPROFLOXACIN HCL 500 MG PO TABS: 500.0000 mg | ORAL_TABLET | Freq: Two times a day (BID) | ORAL | 0 refills | Status: DC

## 2020-01-19 MED ORDER — OLMESARTAN MEDOXOMIL 20 MG PO TABS: 20.0000 mg | ORAL_TABLET | Freq: Every day | ORAL | 3 refills | Status: DC

## 2020-01-19 NOTE — Progress Notes (Signed)
[facility] [place], [place] [address]  Internal MEDICINE  Office Visit Note  Patient Name: Toni [name]  [id number]  [id number]  Date of Service: 02/11/2020  Chief Complaint  Patient presents with  . Follow-up    Still has persistent cough and has high BP  . Diabetes  . Gastroesophageal Reflux  . Hypertension  . Hyperlipidemia  . Quality Metric Gaps    TDAP, PNA    The patient is here for follow up visit. She was changed from olmesartan to diltiazem at her last visit due to chronic cough. Concern was that olmesartan was causing the cough. Blood pressure is higher on diltiazem than on olmesartan and cough has not changed. She continues to have a dry, intermittent cough. She denies wheezing or shortness of breath. She states that she has tolerated the medication change well, but states that change has not helped with chronic type cough.  She did have a recheck of her lipid panel and CMP since her last visit. Though still elevated, her total cholesterol went from 354 to 272, her triglycerides improved from 217 to 194, and her LDL improved from 257/188. Her LDL/HDL ratio remains elevated at 4.0. her liver functions were normal.  The patient states that she has started having some intermittent abdominal pain. Pain is consistent with prior episodes of diverticulitis. This generally requires antibiotic treatment to resolve. She denies nausea, vomiting, or diarrhea at this time. She also denies fever.       Current Medication: Outpatient Encounter Medications as of 01/19/2020  Medication Sig Note  . aspirin 81 MG tablet Take 81 mg by mouth daily.   . Calcium Carbonate-Vitamin D (CALCIUM + D PO) Take 1 tablet by mouth daily.    . cetirizine (ZYRTEC) 10 MG tablet Take 10 mg by mouth as needed.    . cholecalciferol (VITAMIN D) 1000 units tablet Take 1,000 Units by mouth daily.   Marland Kitchen etodolac (LODINE) 400 MG tablet Take 1 tablet (400 mg total) by mouth 2 (two) times  daily.   Marland Kitchen ezetimibe (ZETIA) 10 MG tablet Take 1 tablet (10 mg total) by mouth daily.   . fenofibrate 54 MG tablet TAKE 1 TABLET BY MOUTH EVERY DAY FOR ELEVATED CHOLESTEROL   . hydrochlorothiazide (MICROZIDE) 12.5 MG capsule TAKE 1 CAPSULE BY MOUTH EVERY DAY   . olmesartan (BENICAR) 20 MG tablet Take 1 tablet (20 mg total) by mouth daily.   . pantoprazole (PROTONIX) 40 MG tablet Take 1 tablet (40 mg total) by mouth daily. t   . SYNTHROID 112 MCG tablet Take 1 tablet po QAM   . [DISCONTINUED] diltiazem (CARDIZEM CD) 120 MG 24 hr capsule Take 1 capsule (120 mg total) by mouth daily. 01/19/2020: will go back to olmesartan  . [DISCONTINUED] Docusate Calcium (STOOL SOFTENER PO) Take 1 tablet by mouth daily.   . [DISCONTINUED] HYDROcodone-acetaminophen (NORCO/VICODIN) 5-325 MG tablet Take 1 tablet by mouth every 6 (six) hours as needed for moderate pain.   . [DISCONTINUED] olmesartan (BENICAR) 20 MG tablet Take 1 tablet (20 mg total) by mouth daily.   . [DISCONTINUED] valsartan (DIOVAN) 160 MG tablet Take 160 mg by mouth daily.   . ciprofloxacin (CIPRO) 500 MG tablet Take 1 tablet (500 mg total) by mouth 2 (two) times daily.    Facility-Administered Encounter Medications as of 01/19/2020  Medication  . 0.9 %  sodium chloride infusion    Surgical History: Past Surgical History:  Procedure Laterality Date  . APPENDECTOMY    .  BUNIONECTOMY     right foot  . [facility]   . CATARACT EXTRACTION, BILATERAL Left 10/20/2017  . CATARACT EXTRACTION, BILATERAL Right 10/27/2017  . CHOLECYSTECTOMY    . COLONOSCOPY    . HAMMER TOE SURGERY  12/2012   left toe  . PARTIAL HYSTERECTOMY    . POLYPECTOMY    . ROBOTIC ASSISTED SALPINGO OOPHERECTOMY     right ovary first removed then left ovary removed years later  . TONSILLECTOMY  age 48  . TOTAL THYROIDECTOMY  09/2011    Medical History: Past Medical History:  Diagnosis Date  . Allergic rhinitis   . Allergy   .  Aortic atherosclerosis ([place])   . Diabetes mellitus, type 2 (HCC)    no medicines- was pre diabtes- diet controlled   . Diverticulitis   . Essential hypertension   . GERD (gastroesophageal reflux disease)   . Hyperlipidemia   . Hypertension   . Hypothyroidism   . Obesity   . Osteoporosis   . Transient insomnia   . Tubular adenoma of colon 05/2013    Family History: Family History  Problem Relation Age of Onset  . Throat cancer Father   . Esophageal cancer Father   . Colon cancer Maternal Aunt   . Colon cancer Maternal Uncle   . Colon polyps Maternal Aunt   . Colon polyps Maternal Uncle   . Heart disease Maternal Uncle   . Stomach cancer Maternal Uncle   . Prostate cancer Maternal Uncle   . Breast cancer Cousin   . Diabetes Neg Hx   . Kidney disease Neg Hx   . Gallbladder disease Neg Hx   . Rectal cancer Neg Hx     Social History   Socioeconomic History  . Marital status: Married    Spouse name: Not on file  . Number of children: 2  . Years of education: Not on file  . Highest education level: Not on file  Occupational History  . Occupation: Retired  Tobacco Use  . Smoking status: Former Smoker    Packs/day: 1.00    Years: 30.00    Pack years: 30.00    Types: Cigarettes    Quit date: 07/06/1998    Years since quitting: 21.6  . Smokeless tobacco: Never Used  Substance and Sexual Activity  . Alcohol use: No    Alcohol/week: 0.0 standard drinks  . Drug use: No  . Sexual activity: Not on file  Other Topics Concern  . Not on file  Social History Narrative  . Not on file   Social Determinants of Health   Financial Resource Strain:   . Difficulty of Paying Living Expenses:   Food Insecurity:   . Worried About Charity fundraiser in the Last Year:   . Arboriculturist in the Last Year:   Transportation Needs:   . Film/video editor (Medical):   Marland Kitchen Lack of Transportation (Non-Medical):   Physical Activity:   . Days of Exercise per Week:   . Minutes of  Exercise per Session:   Stress:   . Feeling of Stress :   Social Connections:   . Frequency of Communication with Friends and Family:   . Frequency of Social Gatherings with Friends and Family:   . Attends Religious Services:   . Active Member of Clubs or Organizations:   . Attends Archivist Meetings:   Marland Kitchen Marital Status:   Intimate Partner Violence:   .  Fear of Current or Ex-Partner:   . Emotionally Abused:   Marland Kitchen Physically Abused:   . Sexually Abused:       Review of Systems  Constitutional: Negative for activity change, chills, fatigue and unexpected weight change.  HENT: Negative for congestion, rhinorrhea, sneezing and sore throat.   Respiratory: Positive for cough. Negative for chest tightness, shortness of breath and wheezing.   Cardiovascular: Negative for chest pain and palpitations.       Blood pressure mildly elevated .  Gastrointestinal: Positive for abdominal pain. Negative for constipation, diarrhea, nausea and vomiting.  Endocrine: Negative for cold intolerance, heat intolerance, polydipsia and polyuria.  Musculoskeletal: Positive for arthralgias, back pain and myalgias. Negative for joint swelling and neck pain.  Skin: Negative for rash.  Allergic/Immunologic: Positive for environmental allergies.  Neurological: Negative for dizziness, tremors, numbness and headaches.  Hematological: Negative for adenopathy. Does not bruise/bleed easily.  Psychiatric/Behavioral: Negative for behavioral problems and sleep disturbance. The patient is not nervous/anxious.     Today's Vitals   01/19/20 0947  BP: (!) 152/68  Pulse: 66  Resp: 16  Temp: (!) 97.5 F (36.4 C)  SpO2: 98%  Weight: 166 lb 9.6 oz (75.6 kg)  Height: 5\' 6"  (1.676 m)   Body mass index is 26.89 kg/m.  Physical Exam Vitals and nursing note reviewed.  Constitutional:      General: She is not in acute distress.    Appearance: Normal appearance. She is well-developed. She is not diaphoretic.   HENT:     Head: Normocephalic and atraumatic.     Mouth/Throat:     Pharynx: No oropharyngeal exudate.  Eyes:     Conjunctiva/sclera: Conjunctivae normal.     Pupils: Pupils are equal, round, and reactive to light.  Neck:     Thyroid: No thyromegaly.     Vascular: No carotid bruit or JVD.     Trachea: No tracheal deviation.  Cardiovascular:     Rate and Rhythm: Normal rate and regular rhythm.     Heart sounds: Normal heart sounds. No murmur heard.  No friction rub. No gallop.   Pulmonary:     Effort: Pulmonary effort is normal. No respiratory distress.     Breath sounds: Normal breath sounds. No wheezing or rales.     Comments: There is mild, dry, non-productive cough noted.  Chest:     Chest wall: No tenderness.  Abdominal:     General: Bowel sounds are normal.     Palpations: Abdomen is soft.     Tenderness: There is abdominal tenderness.     Comments: Mild, generalized abdominal tenderness.   Musculoskeletal:        General: Normal range of motion.     Cervical back: Normal range of motion and neck supple.     Comments: Low back pain is moderate to severe and worse with sitting or standing for long periods of time. No visible or palpable abnormalities or deformities noted at this time.   Lymphadenopathy:     Cervical: No cervical adenopathy.  Skin:    General: Skin is warm and dry.  Neurological:     Mental Status: She is alert and oriented to person, place, and time.     Cranial Nerves: No cranial nerve deficit.  Psychiatric:        Behavior: Behavior normal.        Thought Content: Thought content normal.        Judgment: Judgment normal.   Assessment/Plan: 1. Essential hypertension  Discontinue diltiazem and resart olmesartan 20mg  daily. Continue HCTZ 12.5mg  tablets daily. Advised her to limit salt and increase water in the diet.  - olmesartan (BENICAR) 20 MG tablet; Take 1 tablet (20 mg total) by mouth daily.  Dispense: 90 tablet; Refill: 3  2. Mixed  hyperlipidemia Reviewed labs with patient. Improved since she started zetia 10mg  daily. Will continue this daily. Monitor closely.   3. Cough Unclear etiology. Will get chest x-ray for further evaluation.  - DG Chest 2 View; Future  4. Diverticulitis of colon without hemorrhage Start cipro 500mg  twice daily for next 10 days. Monitor closely.  - ciprofloxacin (CIPRO) 500 MG tablet; Take 1 tablet (500 mg total) by mouth 2 (two) times daily.  Dispense: 20 tablet; Refill: 0  General Counseling: roniesha hollingshead understanding of the findings of todays visit and agrees with plan of treatment. I have discussed any further diagnostic evaluation that may be needed or ordered today. We also reviewed her medications today. she has been encouraged to call the office with any questions or concerns that should arise related to todays visit.  Hypertension Counseling:   The following hypertensive lifestyle modification were recommended and discussed:  1. Limiting alcohol intake to less than 1 oz/day of ethanol:(24 oz of beer or 8 oz of wine or 2 oz of 100-proof whiskey). 2. Take baby ASA 81 mg daily. 3. Importance of regular aerobic exercise and losing weight. 4. Reduce dietary saturated fat and cholesterol intake for overall cardiovascular health. 5. Maintaining adequate dietary potassium, calcium, and magnesium intake. 6. Regular monitoring of the blood pressure. 7. Reduce sodium intake to less than 100 mmol/day (less than 2.3 gm of sodium or less than 6 gm of sodium choride)   This patient was seen by [place] with Dr Lavera Guise as a part of collaborative care agreement  Orders Placed This Encounter  Procedures  . DG Chest 2 View    Meds ordered this encounter  Medications  . olmesartan (BENICAR) 20 MG tablet    Sig: Take 1 tablet (20 mg total) by mouth daily.    Dispense:  90 tablet    Refill:  3    Will d/c other bp medication and continue with olmesartan.    Order  Specific Question:   Supervising Provider    Answer:   Lavera Guise [[id number]]  . ciprofloxacin (CIPRO) 500 MG tablet    Sig: Take 1 tablet (500 mg total) by mouth 2 (two) times daily.    Dispense:  20 tablet    Refill:  0    Order Specific Question:   Supervising Provider    Answer:   Lavera Guise [[id number]]    Total time spent: 30 Minutes   Time spent includes review of chart, medications, test results, and follow up plan with the patient.      Dr Lavera Guise Internal medicine

## 2020-02-06 ENCOUNTER — Ambulatory Visit
Admission: RE | Admit: 2020-02-06 | Discharge: 2020-02-06 | Disposition: A | Discharge status: Home / Self Care | Payer: PPO | Source: Ambulatory Visit | Attending: Nurse Practitioner | Admitting: Nurse Practitioner

## 2020-02-06 ENCOUNTER — Other Ambulatory Visit: Payer: Self-pay

## 2020-02-06 ENCOUNTER — Ambulatory Visit
Admission: RE | Admit: 2020-02-06 | Discharge: 2020-02-06 | Disposition: A | Discharge status: Home / Self Care | Payer: PPO | Attending: Nurse Practitioner | Admitting: Nurse Practitioner

## 2020-02-06 DIAGNOSIS — R059 Cough, unspecified: Secondary | ICD-10-CM

## 2020-02-06 DIAGNOSIS — R05 Cough: Secondary | ICD-10-CM | POA: Diagnosis not present

## 2020-02-08 ENCOUNTER — Telehealth: Payer: Self-pay

## 2020-02-08 NOTE — Telephone Encounter (Signed)
Pt.notified

## 2020-02-08 NOTE — Telephone Encounter (Signed)
-----   Message from Lavera Guise, MD sent at 02/07/2020  1:36 PM EDT ----- CXR is normal

## 2020-02-28 ENCOUNTER — Other Ambulatory Visit: Payer: Self-pay

## 2020-02-28 MED ORDER — HYDROCHLOROTHIAZIDE 12.5 MG PO CAPS: ORAL_CAPSULE | ORAL | 1 refills | Status: DC

## 2020-02-29 ENCOUNTER — Other Ambulatory Visit: Payer: Self-pay

## 2020-02-29 DIAGNOSIS — E782 Mixed hyperlipidemia: Secondary | ICD-10-CM

## 2020-02-29 MED ORDER — EZETIMIBE 10 MG PO TABS: 10.0000 mg | ORAL_TABLET | Freq: Every day | ORAL | 3 refills | Status: DC

## 2020-03-05 ENCOUNTER — Other Ambulatory Visit: Payer: Self-pay

## 2020-03-07 ENCOUNTER — Other Ambulatory Visit: Payer: Self-pay

## 2020-04-22 ENCOUNTER — Other Ambulatory Visit: Payer: Self-pay

## 2020-04-22 MED ORDER — FENOFIBRATE 54 MG PO TABS
ORAL_TABLET | ORAL | 1 refills | Status: DC
Start: 2020-04-22 — End: 2020-12-24

## 2020-05-07 DIAGNOSIS — H524 Presbyopia: Secondary | ICD-10-CM | POA: Diagnosis not present

## 2020-05-07 DIAGNOSIS — Z961 Presence of intraocular lens: Secondary | ICD-10-CM | POA: Diagnosis not present

## 2020-05-07 DIAGNOSIS — H52203 Unspecified astigmatism, bilateral: Secondary | ICD-10-CM | POA: Diagnosis not present

## 2020-05-13 ENCOUNTER — Other Ambulatory Visit: Payer: Self-pay

## 2020-05-13 ENCOUNTER — Encounter: Payer: Self-pay | Admitting: Nurse Practitioner

## 2020-05-13 ENCOUNTER — Ambulatory Visit (INDEPENDENT_AMBULATORY_CARE_PROVIDER_SITE_OTHER): Payer: PPO | Admitting: Nurse Practitioner

## 2020-05-13 VITALS — BP 168/82 | HR 80 | Temp 97.7°F | Resp 16 | Ht 66.0 in | Wt 173.4 lb

## 2020-05-13 DIAGNOSIS — K5732 Diverticulitis of large intestine without perforation or abscess without bleeding: Secondary | ICD-10-CM | POA: Diagnosis not present

## 2020-05-13 DIAGNOSIS — Z23 Encounter for immunization: Secondary | ICD-10-CM

## 2020-05-13 DIAGNOSIS — E782 Mixed hyperlipidemia: Secondary | ICD-10-CM

## 2020-05-13 DIAGNOSIS — Z0001 Encounter for general adult medical examination with abnormal findings: Secondary | ICD-10-CM

## 2020-05-13 DIAGNOSIS — R059 Cough, unspecified: Secondary | ICD-10-CM

## 2020-05-13 DIAGNOSIS — I10 Essential (primary) hypertension: Secondary | ICD-10-CM

## 2020-05-13 DIAGNOSIS — R7303 Prediabetes: Secondary | ICD-10-CM

## 2020-05-13 LAB — POCT GLYCOSYLATED HEMOGLOBIN (HGB A1C): Hemoglobin A1C: 6.3 % — AB (ref 4.0–5.6)

## 2020-05-13 MED ORDER — CIPROFLOXACIN HCL 500 MG PO TABS: 500.0000 mg | ORAL_TABLET | Freq: Two times a day (BID) | ORAL | 0 refills | Status: DC

## 2020-05-13 MED ORDER — DILTIAZEM HCL ER 90 MG PO CP12: 90.0000 mg | ORAL_CAPSULE | Freq: Two times a day (BID) | ORAL | 1 refills | Status: DC

## 2020-05-13 MED ORDER — PNEUMOVAX 23 25 MCG/0.5ML IJ INJ: INJECTION | INTRAMUSCULAR | 0 refills | Status: DC

## 2020-05-13 NOTE — Progress Notes (Signed)
[facility] [place],  [address]  Internal MEDICINE  Office Visit Note  Patient Name: Toni [name]  [id number]  [id number]  Date of Service: 06/02/2020   Pt is here for routine health maintenance examination  Chief Complaint  Patient presents with  . Medicare Wellness  . Diabetes  . Gastroesophageal Reflux  . Hyperlipidemia  . Hypertension  . Quality Metric Gaps    flu,tetnaus  . controlled substance form    reviewed with PT  . Abdominal Pain    burning     The patient is here for health maintenance exam. Went back to olmesartan at most recent visit in 01/2020. Continues to have cough. Would like to have another trial diltiazem. Feels like she did not give it long enough to see if it would improve.blood pressure. She did have chest x-ray 02/06/2020 which was negative. Does not wish to have allergy testing done at this time. She does have diet controlled type 2 diabetes. A check of her HgbA1c is 6.3 today. She is due for flu vaccine today. She should also receive pneumovax vaccine. She does admit to some lower abdominal tenderness. This usually happens when she has sflare of diverticulitis. Round of ciprofloxacin will generally improve the symptoms without further intervention. Her last colonoscopy was done 05/25/2020. She had 89m polyp in transverse colon, two 4 to 5319mpolyps in the rectum and transverse colon. She also had medium open-mouthed diverticula in sigmoid and transverse colon. She is due for surveillance colonoscopy in 2022.     Current Medication: Outpatient Encounter Medications as of 05/13/2020  Medication Sig Note  . aspirin 81 MG tablet Take 81 mg by mouth daily.   . Calcium Carbonate-Vitamin D (CALCIUM + D PO) Take 1 tablet by mouth daily.    . cetirizine (ZYRTEC) 10 MG tablet Take 10 mg by mouth as needed.    . cholecalciferol (VITAMIN D) 1000 units tablet Take 1,000 Units by mouth daily.   . Marland Kitchentodolac (LODINE) 400 MG tablet  Take 1 tablet (400 mg total) by mouth 2 (two) times daily.   . fenofibrate 54 MG tablet TAKE 1 TABLET BY MOUTH EVERY DAY FOR ELEVATED CHOLESTEROL   . hydrochlorothiazide (MICROZIDE) 12.5 MG capsule TAKE 1 CAPSULE BY MOUTH EVERY DAY   . pantoprazole (PROTONIX) 40 MG tablet Take 1 tablet (40 mg total) by mouth daily. t   . SYNTHROID 112 MCG tablet Take 1 tablet po QAM   . [DISCONTINUED] ciprofloxacin (CIPRO) 500 MG tablet Take 1 tablet (500 mg total) by mouth 2 (two) times daily.   . [DISCONTINUED] ciprofloxacin (CIPRO) 500 MG tablet Take 1 tablet (500 mg total) by mouth 2 (two) times daily.   . [DISCONTINUED] ezetimibe (ZETIA) 10 MG tablet Take 1 tablet (10 mg total) by mouth daily.   . [DISCONTINUED] olmesartan (BENICAR) 20 MG tablet Take 1 tablet (20 mg total) by mouth daily. 05/13/2020: trial of diltiazem  . diltiazem (CARDIZEM SR) 90 MG 12 hr capsule Take 1 capsule (90 mg total) by mouth 2 (two) times daily.   . pneumococcal 23 valent vaccine (PNEUMOVAX 23) 25 MCG/0.5ML injection Inject 0.19m16mM once    Facility-Administered Encounter Medications as of 05/13/2020  Medication  . 0.9 %  sodium chloride infusion    Surgical History: Past Surgical History:  Procedure Laterality Date  . APPENDECTOMY    . [facility]     [facility]  [facility] EXTRACTION, BILATERAL Left 10/20/2017  . CATARACT EXTRACTION, BILATERAL Right 10/27/2017  . CHOLECYSTECTOMY    . COLONOSCOPY    . HAMMER TOE SURGERY  12/2012   left toe  . PARTIAL HYSTERECTOMY    . POLYPECTOMY    . ROBOTIC ASSISTED SALPINGO OOPHERECTOMY     right ovary first removed then left ovary removed years later  . TONSILLECTOMY  age 76  . TOTAL THYROIDECTOMY  09/2011    Medical History: Past Medical History:  Diagnosis Date  . Allergic rhinitis   . Allergy   . Aortic atherosclerosis ([place])   . Diabetes mellitus, type 2 (HCC)    no medicines- was pre diabtes- diet controlled   .  Diverticulitis   . Essential hypertension   . GERD (gastroesophageal reflux disease)   . Hyperlipidemia   . Hypertension   . Hypothyroidism   . Obesity   . Osteoporosis   . Transient insomnia   . Tubular adenoma of colon 05/2013    Family History: Family History  Problem Relation Age of Onset  . Throat cancer Father   . Esophageal cancer Father   . Colon cancer Maternal Aunt   . Colon cancer Maternal Uncle   . Colon polyps Maternal Aunt   . Colon polyps Maternal Uncle   . Heart disease Maternal Uncle   . Stomach cancer Maternal Uncle   . Prostate cancer Maternal Uncle   . Breast cancer Cousin   . Diabetes Neg Hx   . Kidney disease Neg Hx   . Gallbladder disease Neg Hx   . Rectal cancer Neg Hx       Review of Systems  Constitutional: Negative for activity change, chills, fatigue and unexpected weight change.  HENT: Negative for congestion, rhinorrhea, sneezing and sore throat.   Respiratory: Positive for cough. Negative for chest tightness, shortness of breath and wheezing.        Was changed back to olmesartan and cough is consistent. Chest x-ray done 02/2020 was negative.   Cardiovascular: Negative for chest pain and palpitations.       Blood pressure mildly elevated .  Gastrointestinal: Positive for abdominal pain. Negative for constipation, diarrhea, nausea and vomiting.  Endocrine: Negative for cold intolerance, heat intolerance, polydipsia and polyuria.       Blood sugars doing well   Genitourinary: Negative for dysuria, frequency and urgency.  Musculoskeletal: Positive for arthralgias, back pain and myalgias. Negative for joint swelling and neck pain.  Skin: Negative for rash.  Allergic/Immunologic: Positive for environmental allergies.  Neurological: Negative for dizziness, tremors, numbness and headaches.  Hematological: Negative for adenopathy. Does not bruise/bleed easily.  Psychiatric/Behavioral: Negative for behavioral problems and sleep disturbance. The  patient is not nervous/anxious.      Today's Vitals   05/13/20 1104  BP: (!) 168/82  Pulse: 80  Resp: 16  Temp: 97.7 F (36.5 C)  SpO2: 98%  Weight: 173 lb 6.4 oz (78.7 kg)  Height: $Remove'5\' 6"'naGVJLd$  (1.676 m)   Body mass index is 27.99 kg/m.  Physical Exam Vitals and nursing note reviewed.  Constitutional:      General: She is not in acute distress.    Appearance: Normal appearance. She is well-developed. She is not diaphoretic.  HENT:     Head: Normocephalic and atraumatic.     Mouth/Throat:     Pharynx: No oropharyngeal exudate.  Eyes:     Conjunctiva/sclera: Conjunctivae normal.     Pupils: Pupils are equal, round, and reactive to light.  Neck:  Thyroid: No thyromegaly.     Vascular: No carotid bruit or JVD.     Trachea: No tracheal deviation.  Cardiovascular:     Rate and Rhythm: Normal rate and regular rhythm.     Pulses:          Dorsalis pedis pulses are 1+ on the right side and 1+ on the left side.       Posterior tibial pulses are 1+ on the right side and 1+ on the left side.     Heart sounds: Normal heart sounds. No murmur heard.  No friction rub. No gallop.   Pulmonary:     Effort: Pulmonary effort is normal. No respiratory distress.     Breath sounds: Normal breath sounds. No wheezing or rales.     Comments: There is mild, dry, non-productive cough noted.  Chest:     Chest wall: No tenderness.  Abdominal:     General: Bowel sounds are normal.     Palpations: Abdomen is soft.     Tenderness: There is abdominal tenderness.     Comments: Mild, generalized abdominal tenderness.   Musculoskeletal:        General: Normal range of motion.     Cervical back: Normal range of motion and neck supple.     Right foot: Normal range of motion. No deformity or bunion.     Left foot: Normal range of motion. No deformity or bunion.     Comments: Improved low back pain since her last visit .  Feet:     Right foot:     Protective Sensation: 10 sites tested. 10 sites  sensed.     Skin integrity: Skin integrity normal.     Toenail Condition: Right toenails are normal.     Left foot:     Protective Sensation: 10 sites tested. 10 sites sensed.     Skin integrity: Skin integrity normal.     Toenail Condition: Left toenails are normal.  Lymphadenopathy:     Cervical: No cervical adenopathy.  Skin:    General: Skin is warm and dry.     Capillary Refill: Capillary refill takes less than 2 seconds.  Neurological:     General: No focal deficit present.     Mental Status: She is alert and oriented to person, place, and time.     Cranial Nerves: No cranial nerve deficit.  Psychiatric:        Mood and Affect: Mood normal.        Behavior: Behavior normal.        Thought Content: Thought content normal.        Judgment: Judgment normal.    Depression screen [facility] 2/9 05/13/2020 05/13/2020 11/20/2019 05/10/2019 09/26/2018  Decreased Interest 0 0 0 0 0  Down, Depressed, Hopeless 0 0 0 0 0  PHQ - 2 Score 0 0 0 0 0    Functional Status Survey: Is the patient deaf or have difficulty hearing?: No Does the patient have difficulty seeing, even when wearing glasses/contacts?: No Does the patient have difficulty concentrating, remembering, or making decisions?: No Does the patient have difficulty walking or climbing stairs?: No Does the patient have difficulty dressing or bathing?: No Does the patient have difficulty doing errands alone such as visiting a doctor's office or shopping?: No  MMSE - [place] Exam 05/13/2020 05/10/2019 05/02/2018  Orientation to time _0 Orientation to Place _1 Registration _2 Attention/ Calculation 5 5  5  Recall _0 Language- name 2 objects _1 Language- repeat _2 Language- follow 3 step command _3 Language- read & follow direction _4 Write a sentence _5 Copy design _6 Total score _7 Fall Risk  05/13/2020 11/20/2019 05/10/2019 09/26/2018 05/02/2018  Falls in the past year? 0 0 0  0 No      LABS: Recent Results (from the past 2160 hour(s))  POCT HgB A1C     Status: Abnormal   Collection Time: 05/13/20 11:26 AM  Result Value Ref Range   Hemoglobin A1C 6.3 (A) 4.0 - 5.6 %   HbA1c POC (<> result, manual entry)     HbA1c, POC (prediabetic range)     HbA1c, POC (controlled diabetic range)    Comprehensive metabolic panel     Status: Abnormal   Collection Time: 05/27/20  9:20 AM  Result Value Ref Range   Glucose 153 (H) 65 - 99 mg/dL   BUN 14 8 - 27 mg/dL   Creatinine, Ser 0.79 0.57 - 1.00 mg/dL   GFR calc non Af Amer 75 >59 mL/min/1.73   GFR calc Af Amer 86 >59 mL/min/1.73    Comment: **In accordance with recommendations from the NKF-ASN Task force,**   [facility] is in the process of updating its eGFR calculation to the   2021 CKD-EPI creatinine equation that estimates kidney function   without a race variable.    BUN/Creatinine Ratio 18 12 - 28   Sodium 142 134 - 144 mmol/L   Potassium 4.4 3.5 - 5.2 mmol/L   Chloride 104 96 - 106 mmol/L   CO2 26 20 - 29 mmol/L   Calcium 9.8 8.7 - 10.3 mg/dL   Total Protein 6.4 6.0 - 8.5 g/dL   Albumin 4.3 3.7 - 4.7 g/dL   Globulin, Total 2.1 1.5 - 4.5 g/dL   Albumin/Globulin Ratio 2.0 1.2 - 2.2   Bilirubin Total 0.6 0.0 - 1.2 mg/dL   Alkaline Phosphatase 57 44 - 121 IU/L    Comment:               **Please note reference interval change**   AST 21 0 - 40 IU/L   ALT 28 0 - 32 IU/L  CBC     Status: None   Collection Time: 05/27/20  9:20 AM  Result Value Ref Range   WBC 4.2 3.4 - 10.8 x10E3/uL   RBC 5.05 3.77 - 5.28 x10E6/uL   Hemoglobin 15.1 11.1 - 15.9 g/dL   Hematocrit 44.3 34.0 - 46.6 %   MCV 88 79 - 97 fL   MCH 29.9 26.6 - 33.0 pg   MCHC 34.1 31 - 35 g/dL   RDW 12.4 11.7 - 15.4 %   Platelets 221 150 - 450 x10E3/uL  Lipid Panel With LDL/HDL Ratio     Status: Abnormal   Collection Time: 05/27/20  9:20 AM  Result Value Ref Range   Cholesterol, Total 244 (H) 100 - 199 mg/dL   Triglycerides 148 0 - 149 mg/dL    HDL 50 >39 mg/dL   VLDL Cholesterol Cal 27 5 - 40 mg/dL   LDL Chol Calc (NIH) 167 (H) 0 - 99 mg/dL   LDL/HDL Ratio 3.3 (H) 0.0 - 3.2 ratio    Comment:  LDL/HDL Ratio                                             Men  Women                               1/2 Avg.Risk  1.0    1.5                                   Avg.Risk  3.6    3.2                                2X Avg.Risk  6.2    5.0                                3X Avg.Risk  8.0    6.1   T4, free     Status: None   Collection Time: 05/27/20  9:20 AM  Result Value Ref Range   Free T4 1.59 0.82 - 1.77 ng/dL  TSH     Status: Abnormal   Collection Time: 05/27/20  9:20 AM  Result Value Ref Range   TSH 0.358 (L) 0.450 - 4.500 uIU/mL  VITAMIN D 25 Hydroxy (Vit-D Deficiency, Fractures)     Status: Abnormal   Collection Time: 05/27/20  9:20 AM  Result Value Ref Range   Vit D, 25-Hydroxy 22.5 (L) 30.0 - 100.0 ng/mL    Comment: Vitamin D deficiency has been defined by the [place] practice guideline as a level of serum 25-OH vitamin D less than 20 ng/mL (1,2). The Endocrine Society went on to further define vitamin D insufficiency as a level between 21 and 29 ng/mL (2). 1. IOM (Institute of Medicine). 2010. Dietary reference    intakes for calcium and D. [place]: The    [employer]. 2. Holick MF, Binkley Rolette, Bischoff-Ferrari HA, et al.    Evaluation, treatment, and prevention of vitamin D    deficiency: an Endocrine Society clinical practice    guideline. JCEM. 2011 Jul; 96(7):1911-30.    Assessment/Plan: 1. Encounter for general adult medical examination with abnormal findings Annual health maintenance exam today.   2. Cough Persistent cough. Negative chest x-ray. Trial of different BP medication again today.   3. Essential hypertension Trial diltiazem SR 12m twice daily. Monitor blood pressure closely.  - diltiazem (CARDIZEM SR) 90 MG 12  hr capsule; Take 1 capsule (90 mg total) by mouth 2 (two) times daily.  Dispense: 60 capsule; Refill: 1  4. Pre-diabetes - POCT HgB A1C 6.3 today. Continue to control with diet and lifestyle modifications. Monitor blood sugars closely.  5. Diverticulitis of colon without hemorrhage Ciprofloxacin 5077mtwice daily for ten days. Will monitor.   6. Mixed hyperlipidemia Check routine fasting labs   7. Needs flu shot Flu vaccine administered today.  - Flu Vaccine MDCK QUAD PF  8. Need for vaccination against Streptococcus pneumoniae using pneumococcal conjugate vaccine 13 Prescription for pneuovax sent to her pharmacy for administration.  - pneumococcal 23 valent vaccine (PNEUMOVAX 23) 25 MCG/0.5ML injection; Inject 0.60m660mM once  Dispense: 0.5 mL; Refill: 0  General Counseling: Toni [name] understanding of the findings of todays visit and agrees with plan of treatment. I have discussed any further diagnostic evaluation that may be needed or ordered today. We also reviewed her medications today. she has been encouraged to call the office with any questions or concerns that should arise related to todays visit.    Counseling:  Hypertension Counseling:   The following hypertensive lifestyle modification were recommended and discussed:  1. Limiting alcohol intake to less than 1 oz/day of ethanol:(24 oz of beer or 8 oz of wine or 2 oz of 100-proof whiskey). 2. Take baby ASA 81 mg daily. 3. Importance of regular aerobic exercise and losing weight. 4. Reduce dietary saturated fat and cholesterol intake for overall cardiovascular health. 5. Maintaining adequate dietary potassium, calcium, and magnesium intake. 6. Regular monitoring of the blood pressure. 7. Reduce sodium intake to less than 100 mmol/day (less than 2.3 gm of sodium or less than 6 gm of sodium choride)   This patient was seen by [place] with Dr Lavera Guise as a part of collaborative care  agreement  Orders Placed This Encounter  Procedures  . Flu Vaccine MDCK QUAD PF  . POCT HgB A1C    Meds ordered this encounter  Medications  . diltiazem (CARDIZEM SR) 90 MG 12 hr capsule    Sig: Take 1 capsule (90 mg total) by mouth 2 (two) times daily.    Dispense:  60 capsule    Refill:  1    Please d/c olmesartan    Order Specific Question:   Supervising Provider    Answer:   Lavera Guise [[id number]]  . pneumococcal 23 valent vaccine (PNEUMOVAX 23) 25 MCG/0.5ML injection    Sig: Inject 0.33m IM once    Dispense:  0.5 mL    Refill:  0    Need for pneumonia vaccine    Order Specific Question:   Supervising Provider    Answer:   KLavera Guise[[id number]]  . DISCONTD: ciprofloxacin (CIPRO) 500 MG tablet    Sig: Take 1 tablet (500 mg total) by mouth 2 (two) times daily.    Dispense:  20 tablet    Refill:  0    Order Specific Question:   Supervising Provider    Answer:   KLavera Guise[[[id number]   Total time spent: 479Minutes  Time spent includes review of chart, medications, test results, and follow up plan with the patient.     FLavera Guise MD  Internal Medicine

## 2020-05-14 ENCOUNTER — Telehealth: Payer: Self-pay

## 2020-05-14 ENCOUNTER — Other Ambulatory Visit: Payer: Self-pay | Admitting: Nurse Practitioner

## 2020-05-14 DIAGNOSIS — K5732 Diverticulitis of large intestine without perforation or abscess without bleeding: Secondary | ICD-10-CM

## 2020-05-14 MED ORDER — CIPROFLOXACIN HCL 500 MG PO TABS: 500.0000 mg | ORAL_TABLET | Freq: Two times a day (BID) | ORAL | 0 refills | Status: DC

## 2020-05-15 ENCOUNTER — Other Ambulatory Visit: Payer: Self-pay

## 2020-05-15 MED ORDER — METRONIDAZOLE 500 MG PO TABS
500.0000 mg | ORAL_TABLET | Freq: Two times a day (BID) | ORAL | 0 refills | Status: DC
Start: 2020-05-15 — End: 2020-06-11

## 2020-05-15 NOTE — Telephone Encounter (Signed)
Pt.notified

## 2020-05-15 NOTE — Telephone Encounter (Signed)
Lmom that we send metronidazole(flagyl) 500mg  take 1 tab po BID for 7 days

## 2020-05-15 NOTE — Telephone Encounter (Signed)
Thank you :)

## 2020-05-23 ENCOUNTER — Telehealth: Payer: Self-pay

## 2020-05-24 NOTE — Telephone Encounter (Signed)
Pt advised labsslip is ready for pickup

## 2020-05-27 ENCOUNTER — Other Ambulatory Visit: Payer: Self-pay | Admitting: Nurse Practitioner

## 2020-05-27 ENCOUNTER — Other Ambulatory Visit: Payer: Self-pay

## 2020-05-27 DIAGNOSIS — Z0001 Encounter for general adult medical examination with abnormal findings: Secondary | ICD-10-CM | POA: Diagnosis not present

## 2020-05-27 DIAGNOSIS — E782 Mixed hyperlipidemia: Secondary | ICD-10-CM

## 2020-05-27 DIAGNOSIS — E559 Vitamin D deficiency, unspecified: Secondary | ICD-10-CM | POA: Diagnosis not present

## 2020-05-27 DIAGNOSIS — I10 Essential (primary) hypertension: Secondary | ICD-10-CM | POA: Diagnosis not present

## 2020-05-27 DIAGNOSIS — E039 Hypothyroidism, unspecified: Secondary | ICD-10-CM | POA: Diagnosis not present

## 2020-05-27 MED ORDER — EZETIMIBE 10 MG PO TABS: 10.0000 mg | ORAL_TABLET | Freq: Every day | ORAL | 3 refills | Status: DC

## 2020-05-28 LAB — COMPREHENSIVE METABOLIC PANEL
ALT: 28 IU/L (ref 0–32)
AST: 21 IU/L (ref 0–40)
Albumin/Globulin Ratio: 2 (ref 1.2–2.2)
Albumin: 4.3 g/dL (ref 3.7–4.7)
Alkaline Phosphatase: 57 IU/L (ref 44–121)
BUN/Creatinine Ratio: 18 (ref 12–28)
BUN: 14 mg/dL (ref 8–27)
Bilirubin Total: 0.6 mg/dL (ref 0.0–1.2)
CO2: 26 mmol/L (ref 20–29)
Calcium: 9.8 mg/dL (ref 8.7–10.3)
Chloride: 104 mmol/L (ref 96–106)
Creatinine, Ser: 0.79 mg/dL (ref 0.57–1.00)
GFR calc Af Amer: 86 mL/min/{1.73_m2} (ref >59)
GFR calc non Af Amer: 75 mL/min/{1.73_m2} (ref >59)
Globulin, Total: 2.1 g/dL (ref 1.5–4.5)
Glucose: 153 mg/dL — ABNORMAL HIGH (ref 65–99)
Potassium: 4.4 mmol/L (ref 3.5–5.2)
Sodium: 142 mmol/L (ref 134–144)
Total Protein: 6.4 g/dL (ref 6.0–8.5)

## 2020-05-28 LAB — CBC
Hematocrit: 44.3 % (ref 34.0–46.6)
Hemoglobin: 15.1 g/dL (ref 11.1–15.9)
MCH: 29.9 pg (ref 26.6–33.0)
MCHC: 34.1 g/dL (ref 31.5–35.7)
MCV: 88 fL (ref 79–97)
Platelets: 221 10*3/uL (ref 150–450)
RBC: 5.05 x10E6/uL (ref 3.77–5.28)
RDW: 12.4 % (ref 11.7–15.4)
WBC: 4.2 10*3/uL (ref 3.4–10.8)

## 2020-05-28 LAB — LIPID PANEL WITH LDL/HDL RATIO
Cholesterol, Total: 244 mg/dL — ABNORMAL HIGH (ref 100–199)
HDL: 50 mg/dL (ref >39)
LDL Chol Calc (NIH): 167 mg/dL — ABNORMAL HIGH (ref 0–99)
LDL/HDL Ratio: 3.3 ratio — ABNORMAL HIGH (ref 0.0–3.2)
Triglycerides: 148 mg/dL (ref 0–149)
VLDL Cholesterol Cal: 27 mg/dL (ref 5–40)

## 2020-05-28 LAB — VITAMIN D 25 HYDROXY (VIT D DEFICIENCY, FRACTURES): Vit D, 25-Hydroxy: 22.5 ng/mL — ABNORMAL LOW (ref 30.0–100.0)

## 2020-05-28 LAB — TSH: TSH: 0.358 u[IU]/mL — ABNORMAL LOW (ref 0.450–4.500)

## 2020-05-28 LAB — T4, FREE: Free T4: 1.59 ng/dL (ref 0.82–1.77)

## 2020-06-01 NOTE — Progress Notes (Signed)
Improved cholesterol. Low vitamin d. Discuss with patient at next visit 12/7

## 2020-06-02 DIAGNOSIS — Z23 Encounter for immunization: Secondary | ICD-10-CM | POA: Insufficient documentation

## 2020-06-06 ENCOUNTER — Other Ambulatory Visit: Payer: Self-pay | Admitting: Nurse Practitioner

## 2020-06-06 ENCOUNTER — Telehealth: Payer: Self-pay

## 2020-06-06 DIAGNOSIS — I10 Essential (primary) hypertension: Secondary | ICD-10-CM

## 2020-06-06 MED ORDER — EDARBI 40 MG PO TABS: 40.0000 mg | ORAL_TABLET | Freq: Every day | ORAL | 1 refills | Status: DC

## 2020-06-06 NOTE — Telephone Encounter (Signed)
She should d/c diltiazem. Since she is already on low dose HCTZ, started edarbi at 40mg  daily. I sent new prescription to her pharmacy today. She should continue to monitor her blood pressure closely. She has follow up 12/7.

## 2020-06-07 NOTE — Telephone Encounter (Signed)
Pt.notified

## 2020-06-10 IMAGING — CT CT MAXILLOFACIAL W/O CM
3 of 4 series · 13 of 47 positions shown, 15 images · non-contrast
Comparison: 09/14/2007.

CLINICAL DATA: Recurring chronic sinusitis.

EXAM:
CT PARANASAL SINUS WITHOUT CONTRAST
TECHNIQUE: Multidetector CT images of the paranasal sinuses were obtained using
the standard protocol without intravenous contrast.

[Series 4: sinus · axial · 0.28mm/px · z∈[-651,-549]mm · 7 of 61 slices shown, 9 images (1 of 3)]
[im 5/61  brain]
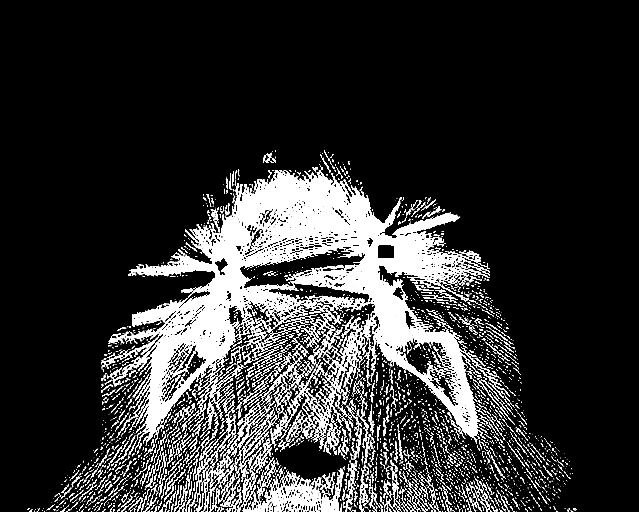
[im 5/61  bone]
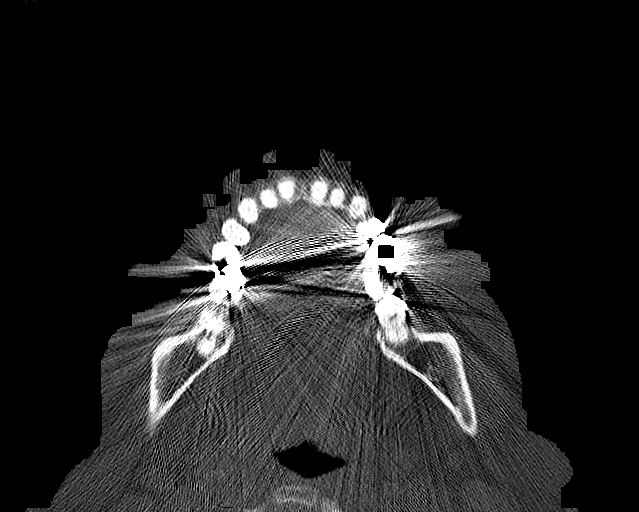
[im 13/61  bone]
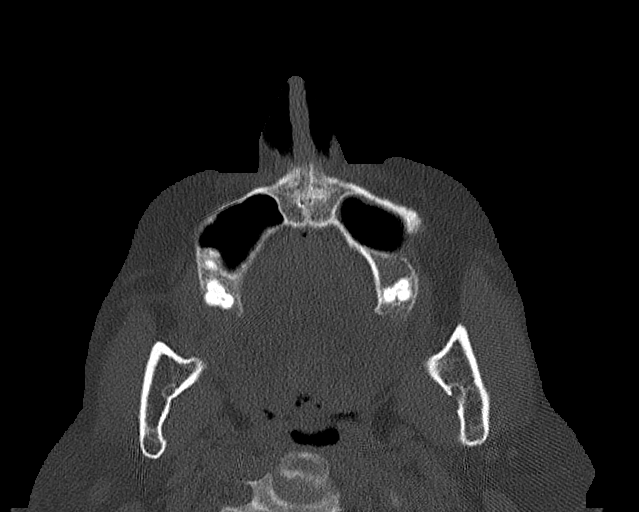
[im 22/61  bone]
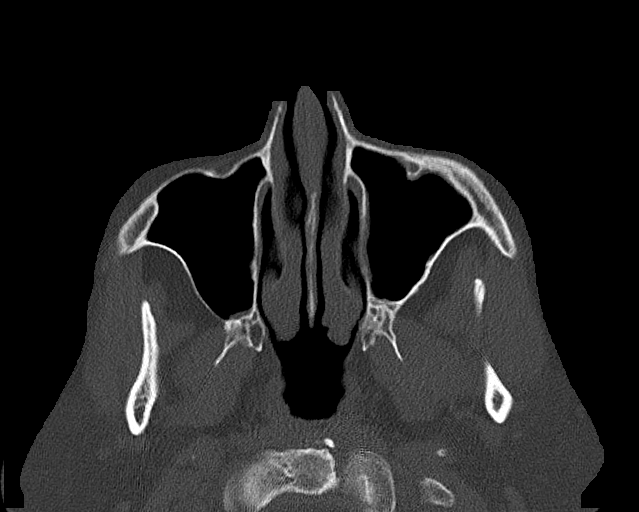
[im 31/61  bone]
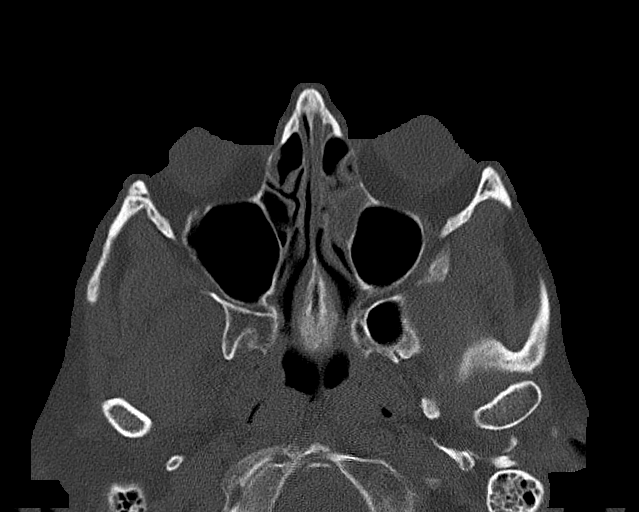
[im 39/61  brain]
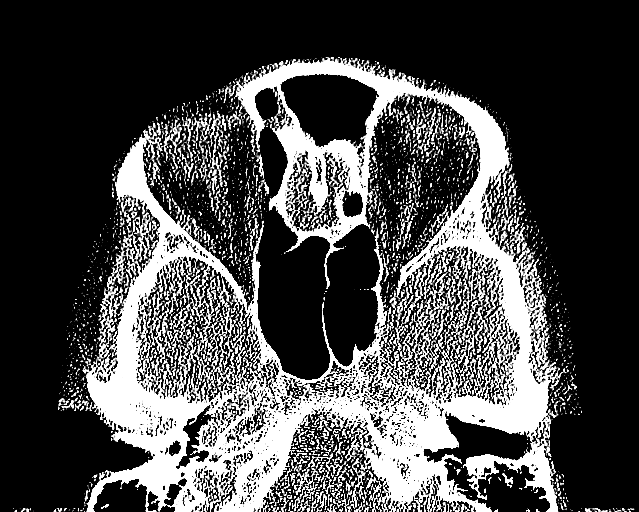
[im 39/61  bone]
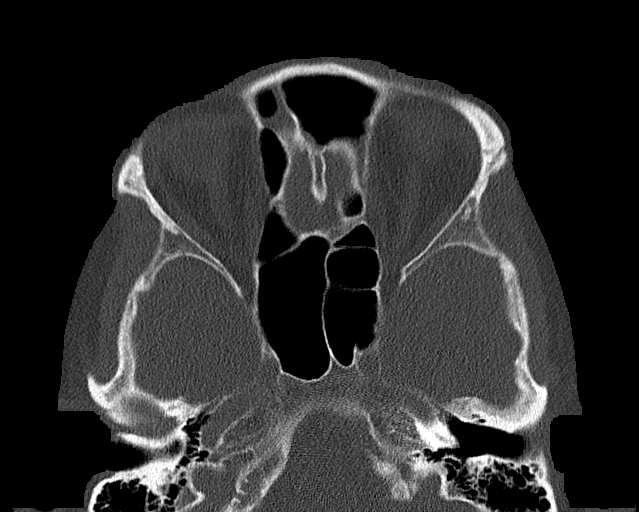
[im 48/61  bone]
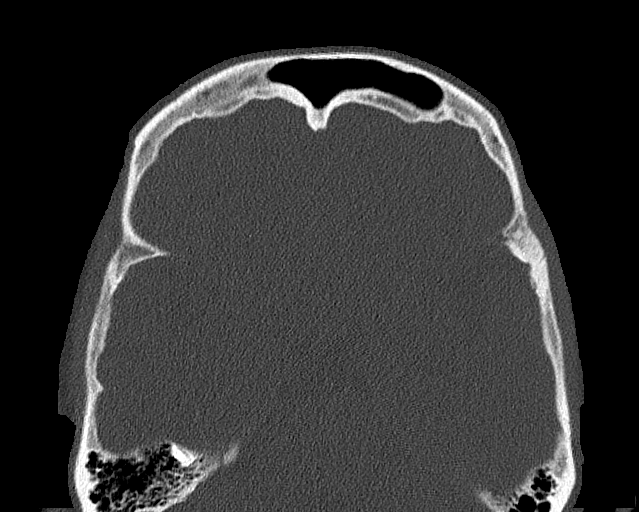
[im 56/61  bone]
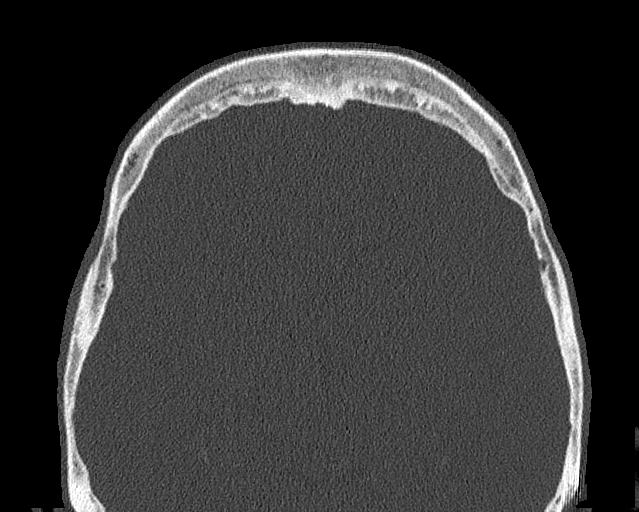

[Series 8: sinus · coronal · 0.24mm/px · 3 of 71 slices shown (2 of 3)]
[im 24/71  bone]
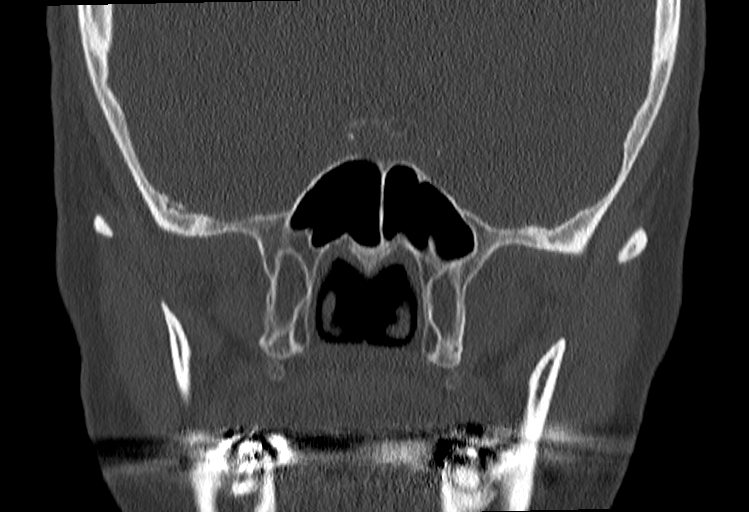
[im 32/71  bone]
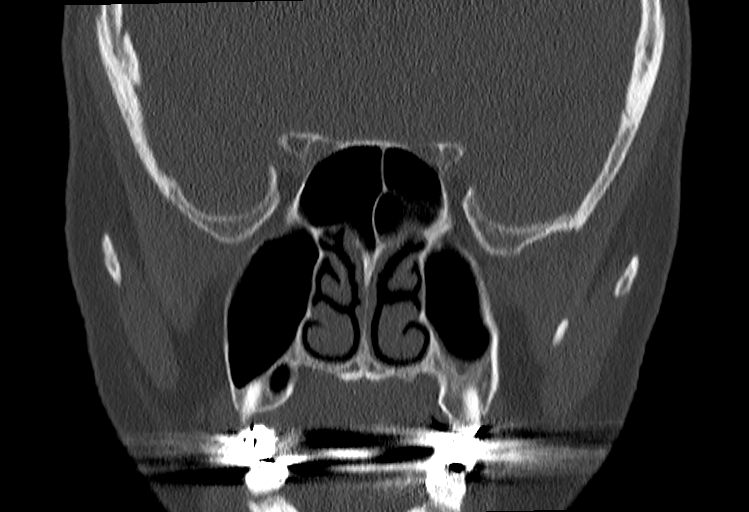
[im 39/71  bone]
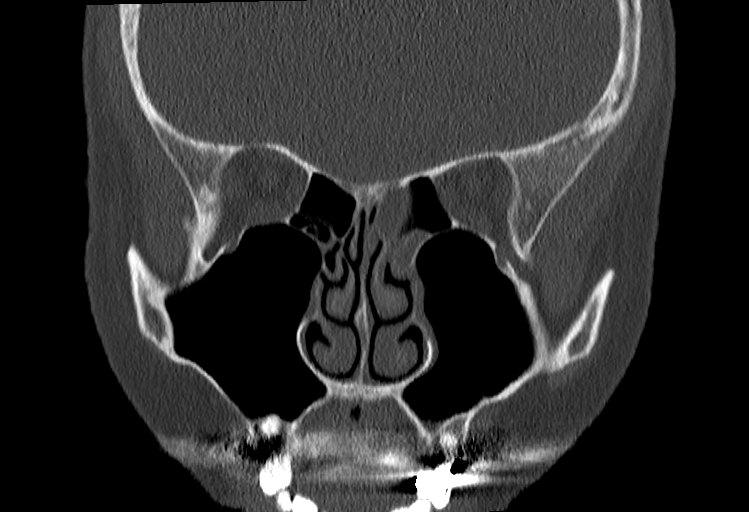

[Series 10: sinus · sagittal · 0.24mm/px · 3 of 89 slices shown (3 of 3)]
[im 30/89  bone]
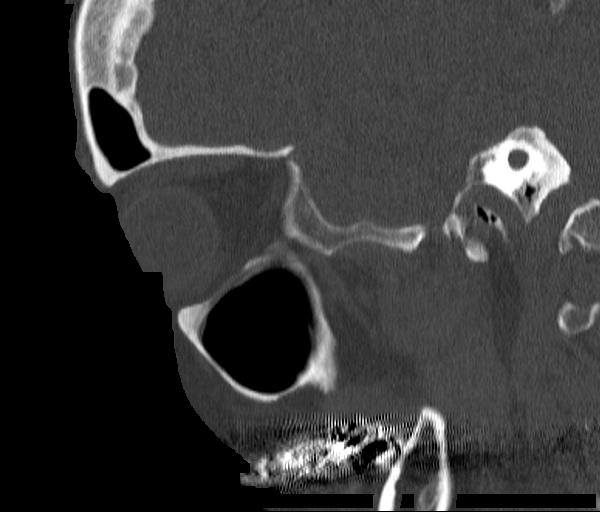
[im 45/89  bone]
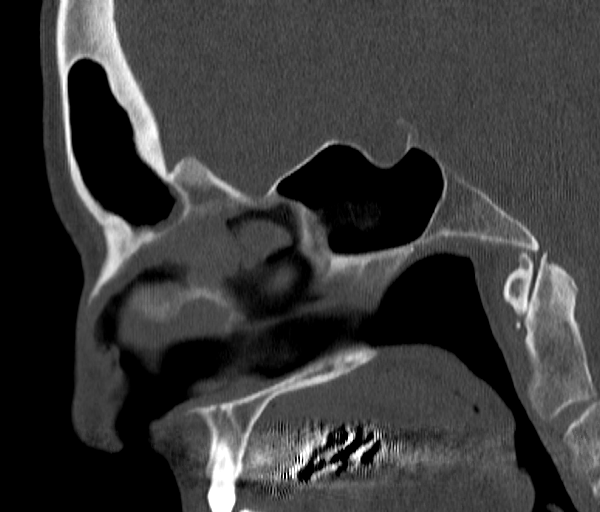
[im 59/89  bone]
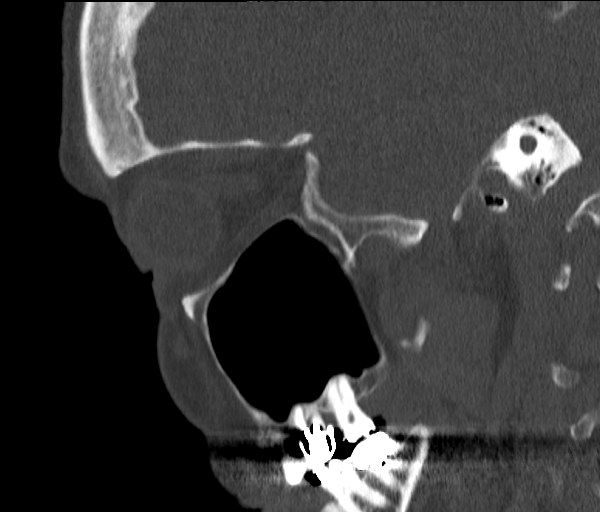

[13 of 47 positions shown; findings below may reference images not displayed]

FINDINGS: Paranasal sinuses:

Frontal: Normally aerated. Patent frontal sinus drainage pathways.

Ethmoid: BILATERAL ethmoid sinus fluid, greater on the LEFT.

Maxillary: Mucosal thickening in the maxillary sinuses without
significant opacity. Dehiscence of the floor of the LEFT maxillary
sinus, see coronal series 8, image 35 for instance.

Sphenoid: Normally aerated. Patent sphenoethmoidal recesses.

Right ostiomeatal unit: Narrowed but patent. Prominent ethmoidal
bullae.

Left ostiomeatal unit: Obstructed, mucosal thickening, prominent
ethmoidal bullae.

Nasal passages: Patent. Intact nasal septum is midline.

Anatomy: BILATERAL frontal sinus pneumatization superior to anterior
ethmoid notches. Symmetric and intact olfactory grooves and fovea
ethmoidalis, Keros II (4-7mm) Sellar sphenoid pneumatization
pattern. No dehiscence of carotid or optic canals. No onodi cell.

Other: Orbits and intracranial compartment are unremarkable. Visible
mastoid air cells are normally aerated.

Compared with priors, similar appearance.
IMPRESSION: BILATERAL ethmoid sinus fluid, greater on the LEFT. LEFT ostiomeatal
unit obstruction. Minor mucosal thickening both maxillary sinuses.

Dehiscence of the floor of the LEFT maxillary sinus, possibly
related to previous dental procedure, uncertain significance.

## 2020-06-11 ENCOUNTER — Encounter: Payer: Self-pay | Admitting: Nurse Practitioner

## 2020-06-11 ENCOUNTER — Other Ambulatory Visit: Payer: Self-pay

## 2020-06-11 ENCOUNTER — Ambulatory Visit (INDEPENDENT_AMBULATORY_CARE_PROVIDER_SITE_OTHER): Payer: PPO | Admitting: Nurse Practitioner

## 2020-06-11 VITALS — BP 150/80 | HR 78 | Temp 97.5°F | Resp 16 | Ht 66.5 in | Wt 173.0 lb

## 2020-06-11 DIAGNOSIS — R7303 Prediabetes: Secondary | ICD-10-CM | POA: Diagnosis not present

## 2020-06-11 DIAGNOSIS — E782 Mixed hyperlipidemia: Secondary | ICD-10-CM

## 2020-06-11 DIAGNOSIS — I10 Essential (primary) hypertension: Secondary | ICD-10-CM

## 2020-06-11 DIAGNOSIS — E039 Hypothyroidism, unspecified: Secondary | ICD-10-CM

## 2020-06-11 MED ORDER — HYDROCHLOROTHIAZIDE 12.5 MG PO CAPS: ORAL_CAPSULE | ORAL | 1 refills | Status: DC

## 2020-06-11 NOTE — Progress Notes (Signed)
[facility] [address] [place], [place] [address]  Internal MEDICINE  Office Visit Note  Patient Name: Toni [name]  [id number]  [id number]  Date of Service: 07/10/2020  Chief Complaint  Patient presents with  . Diabetes  . Hypertension  . Hyperlipidemia    The patient is here for follow up visit. Was started on edarbi at her last visit. Blood pressure is improved, though still elevated. Cough has improved since medication was changed. She reports no negative side effects.  -routine, fasting labs done since her last visit.  --improved lipid panel since addition of Zetia daily. Not able to tolerate statins.  ---total 272-244 ---trigs 194-148 ---HDL 47-50 ---LDL 188-167 ---LDL/HDL ratio 4.0-3.8 --blood sugar was elevated, however, HgbA1c has improved from 6.3 to 6.2. --TSH low at 0.358 with normal Free t4.  -no new concerns or complaints today.       Current Medication: Outpatient Encounter Medications as of 06/11/2020  Medication Sig  . aspirin 81 MG tablet Take 81 mg by mouth daily.  . Azilsartan Medoxomil (EDARBI) 40 MG TABS Take 40 mg by mouth daily.  . Calcium Carbonate-Vitamin D (CALCIUM + D PO) Take 1 tablet by mouth daily.   . cetirizine (ZYRTEC) 10 MG tablet Take 10 mg by mouth as needed.   . cholecalciferol (VITAMIN D) 1000 units tablet Take 1,000 Units by mouth daily.  Marland Kitchen etodolac (LODINE) 400 MG tablet Take 1 tablet (400 mg total) by mouth 2 (two) times daily.  Marland Kitchen ezetimibe (ZETIA) 10 MG tablet Take 1 tablet (10 mg total) by mouth daily.  . fenofibrate 54 MG tablet TAKE 1 TABLET BY MOUTH EVERY DAY FOR ELEVATED CHOLESTEROL  . hydrochlorothiazide (MICROZIDE) 12.5 MG capsule Take 2 capsules po QD  . pantoprazole (PROTONIX) 40 MG tablet Take 1 tablet (40 mg total) by mouth daily. t  . pneumococcal 23 valent vaccine (PNEUMOVAX 23) 25 MCG/0.5ML injection Inject 0.20ml IM once  . SYNTHROID 112 MCG tablet Take 1 tablet po QAM  . [DISCONTINUED]  hydrochlorothiazide (MICROZIDE) 12.5 MG capsule TAKE 1 CAPSULE BY MOUTH EVERY DAY  . [DISCONTINUED] metroNIDAZOLE (FLAGYL) 500 MG tablet Take 1 tablet (500 mg total) by mouth 2 (two) times daily.   Facility-Administered Encounter Medications as of 06/11/2020  Medication  . 0.9 %  sodium chloride infusion    Surgical History: Past Surgical History:  Procedure Laterality Date  . APPENDECTOMY    . BUNIONECTOMY     right foot  . [facility]   . CATARACT EXTRACTION, BILATERAL Left 10/20/2017  . CATARACT EXTRACTION, BILATERAL Right 10/27/2017  . CHOLECYSTECTOMY    . COLONOSCOPY    . HAMMER TOE SURGERY  12/2012   left toe  . PARTIAL HYSTERECTOMY    . POLYPECTOMY    . ROBOTIC ASSISTED SALPINGO OOPHERECTOMY     right ovary first removed then left ovary removed years later  . TONSILLECTOMY  age 62  . TOTAL THYROIDECTOMY  09/2011    Medical History: Past Medical History:  Diagnosis Date  . Allergic rhinitis   . Allergy   . Aortic atherosclerosis ([place])   . Diabetes mellitus, type 2 (HCC)    no medicines- was pre diabtes- diet controlled   . Diverticulitis   . Essential hypertension   . GERD (gastroesophageal reflux disease)   . Hyperlipidemia   . Hypertension   . Hypothyroidism   . Obesity   . Osteoporosis   . Transient insomnia   . Tubular  adenoma of colon 05/2013    Family History: Family History  Problem Relation Age of Onset  . Throat cancer Father   . Esophageal cancer Father   . Colon cancer Maternal Aunt   . Colon cancer Maternal Uncle   . Colon polyps Maternal Aunt   . Colon polyps Maternal Uncle   . Heart disease Maternal Uncle   . Stomach cancer Maternal Uncle   . Prostate cancer Maternal Uncle   . Breast cancer Cousin   . Diabetes Neg Hx   . Kidney disease Neg Hx   . Gallbladder disease Neg Hx   . Rectal cancer Neg Hx     Social History   Socioeconomic History  . Marital status: Married    Spouse name: Not on file   . Number of children: 2  . Years of education: Not on file  . Highest education level: Not on file  Occupational History  . Occupation: Retired  Tobacco Use  . Smoking status: Former Smoker    Packs/day: 1.00    Years: 30.00    Pack years: 30.00    Types: Cigarettes    Quit date: 07/06/1998    Years since quitting: 22.0  . Smokeless tobacco: Never Used  Substance and Sexual Activity  . Alcohol use: No    Alcohol/week: 0.0 standard drinks  . Drug use: No  . Sexual activity: Not on file  Other Topics Concern  . Not on file  Social History Narrative  . Not on file   Social Determinants of Health   Financial Resource Strain: Not on file  Food Insecurity: Not on file  Transportation Needs: Not on file  Physical Activity: Not on file  Stress: Not on file  Social Connections: Not on file  Intimate Partner Violence: Not on file      Review of Systems  Constitutional: Negative for activity change, chills, fatigue and unexpected weight change.  HENT: Negative for congestion, postnasal drip, rhinorrhea, sneezing and sore throat.   Respiratory: Positive for cough. Negative for chest tightness and shortness of breath.        Cough improved since change of meds to [place].   Cardiovascular: Negative for chest pain and palpitations.       Systolic blood pressure still mildly elevated.   Gastrointestinal: Negative for abdominal pain, constipation, diarrhea, nausea and vomiting.  Endocrine: Negative for cold intolerance, heat intolerance, polydipsia and polyuria.  Musculoskeletal: Negative for arthralgias, back pain, joint swelling and neck pain.  Skin: Negative for rash.  Allergic/Immunologic: Negative for environmental allergies.  Neurological: Negative for dizziness, tremors, numbness and headaches.  Hematological: Negative for adenopathy. Does not bruise/bleed easily.  Psychiatric/Behavioral: Negative for behavioral problems (Depression), sleep disturbance and suicidal ideas. The  patient is not nervous/anxious.     Today's Vitals   06/11/20 1117  BP: (!) 150/80  Pulse: 78  Resp: 16  Temp: (!) 97.5 F (36.4 C)  SpO2: 95%  Weight: 173 lb (78.5 kg)  Height: 5' 6.5" (1.689 m)   Body mass index is 27.5 kg/m.  Physical Exam Vitals and nursing note reviewed.  Constitutional:      General: She is not in acute distress.    Appearance: Normal appearance. She is well-developed and well-nourished. She is not diaphoretic.  HENT:     Head: Normocephalic and atraumatic.     Nose: Nose normal.     Mouth/Throat:     Mouth: Oropharynx is clear and moist.     Pharynx: No oropharyngeal exudate.  Eyes:     Extraocular Movements: EOM normal.     Pupils: Pupils are equal, round, and reactive to light.  Neck:     Thyroid: No thyromegaly.     Vascular: No carotid bruit or JVD.     Trachea: No tracheal deviation.  Cardiovascular:     Rate and Rhythm: Normal rate and regular rhythm.     Heart sounds: Normal heart sounds. No murmur heard. No friction rub. No gallop.   Pulmonary:     Effort: Pulmonary effort is normal. No respiratory distress.     Breath sounds: Normal breath sounds. No wheezing or rales.  Chest:     Chest wall: No tenderness.  Abdominal:     General: Bowel sounds are normal.     Palpations: Abdomen is soft.     Tenderness: There is no abdominal tenderness.  Musculoskeletal:        General: Normal range of motion.     Cervical back: Normal range of motion and neck supple.  Lymphadenopathy:     Cervical: No cervical adenopathy.  Skin:    General: Skin is warm and dry.  Neurological:     Mental Status: She is alert and oriented to person, place, and time.     Cranial Nerves: No cranial nerve deficit.  Psychiatric:        Mood and Affect: Mood and affect and mood normal.        Behavior: Behavior normal.        Thought Content: Thought content normal.        Judgment: Judgment normal.    Assessment/Plan: 1. Essential hypertension Improved  with improved cough. Continue Edarbi as prescribed. Increase current dose HCTZ to 25mg  daily. Encouraged patient to monitor blood pressur closely.  - hydrochlorothiazide (MICROZIDE) 12.5 MG capsule; Take 2 capsules po QD  Dispense: 180 capsule; Refill: 1  2. Mixed hyperlipidemia Reviewed all labs. Though still elevated, lipid panel improved since addition of Zetia. Continue to follow prudent diet. Will continue to monitor closely.   3. Acquired hypothyroidism Thyroid panel stable. Continue levothyroxine at current dose.   4. Pre-diabetes Blood sugar elevated with HgbA1c 6.2 with recent check. Continue to follow prudent diet, low in sugar and carbohydrates. Monitor closely.   General Counseling: quetzali heinle understanding of the findings of todays visit and agrees with plan of treatment. I have discussed any further diagnostic evaluation that may be needed or ordered today. We also reviewed her medications today. she has been encouraged to call the office with any questions or concerns that should arise related to todays visit.  Hypertension Counseling:   The following hypertensive lifestyle modification were recommended and discussed:  1. Limiting alcohol intake to less than 1 oz/day of ethanol:(24 oz of beer or 8 oz of wine or 2 oz of 100-proof whiskey). 2. Take baby ASA 81 mg daily. 3. Importance of regular aerobic exercise and losing weight. 4. Reduce dietary saturated fat and cholesterol intake for overall cardiovascular health. 5. Maintaining adequate dietary potassium, calcium, and magnesium intake. 6. Regular monitoring of the blood pressure. 7. Reduce sodium intake to less than 100 mmol/day (less than 2.3 gm of sodium or less than 6 gm of sodium choride)   This patient was seen by [place] with Dr Lavera Guise as a part of collaborative care agreement  Meds ordered this encounter  Medications  . hydrochlorothiazide (MICROZIDE) 12.5 MG capsule    Sig:  Take 2 capsules po QD  Dispense:  180 capsule    Refill:  1    Increased dose to 25mg  daily    Order Specific Question:   Supervising Provider    Answer:   Lavera Guise [[id number]]    Total time spent: 30 Minutes   Time spent includes review of chart, medications, test results, and follow up plan with the patient.      Dr Lavera Guise Internal medicine

## 2020-06-21 DIAGNOSIS — J1089 Influenza due to other identified influenza virus with other manifestations: Secondary | ICD-10-CM | POA: Diagnosis not present

## 2020-06-21 DIAGNOSIS — Z20822 Contact with and (suspected) exposure to covid-19: Secondary | ICD-10-CM | POA: Diagnosis not present

## 2020-06-25 ENCOUNTER — Telehealth: Payer: Self-pay

## 2020-06-25 ENCOUNTER — Other Ambulatory Visit: Payer: Self-pay | Admitting: Nurse Practitioner

## 2020-06-25 DIAGNOSIS — J069 Acute upper respiratory infection, unspecified: Secondary | ICD-10-CM

## 2020-06-25 MED ORDER — CLARITHROMYCIN 500 MG PO TABS: 500.0000 mg | ORAL_TABLET | Freq: Two times a day (BID) | ORAL | 0 refills | Status: DC

## 2020-06-25 NOTE — Telephone Encounter (Signed)
Pt started getting sick Wednesday night, coughing up green mucus, pt went to urgent care and has the flu, but negative for COVID. pt was not given any meds, but to continue OTC meds, but its not really helping. Does pt need to be seen, or does she needs prescription meds?

## 2020-06-25 NOTE — Telephone Encounter (Signed)
I sent patient prescription for biaxin which is taken twice daily for next 7 days. I also recommend getting some Elderberry. This has shown to work better to help reduce the severity and length of time flu symptoms last than tamiflu. Plus has less negative reported side effects. To help boost immune system, she may als try vitamin c and zinc. She should rest and drink planty of fluids. Continue to use to OTC medicaitons as needed to help with symptom management

## 2020-06-26 NOTE — Telephone Encounter (Signed)
Pt started meds yesterday, she said today she feels slightly better and has completed a few tasks, and she doesn't think she is coughing as much. Pt said she will try the elderberry.

## 2020-06-26 NOTE — Telephone Encounter (Signed)
That's great! Thank you.

## 2020-07-18 ENCOUNTER — Telehealth: Payer: Self-pay

## 2020-07-19 ENCOUNTER — Other Ambulatory Visit: Payer: Self-pay | Admitting: Nurse Practitioner

## 2020-07-19 DIAGNOSIS — I10 Essential (primary) hypertension: Secondary | ICD-10-CM

## 2020-07-19 MED ORDER — OLMESARTAN MEDOXOMIL 20 MG PO TABS: 20.0000 mg | ORAL_TABLET | Freq: Every day | ORAL | 3 refills | Status: DC

## 2020-07-19 NOTE — Telephone Encounter (Signed)
Lmom  we change med

## 2020-07-19 NOTE — Telephone Encounter (Signed)
I took care oft his change and sent new, 90 day prescription to [facility].

## 2020-07-29 ENCOUNTER — Other Ambulatory Visit: Payer: Self-pay

## 2020-07-29 MED ORDER — PANTOPRAZOLE SODIUM 40 MG PO TBEC
40.0000 mg | DELAYED_RELEASE_TABLET | Freq: Every day | ORAL | 3 refills | Status: DC
Start: 2020-07-29 — End: 2021-11-17

## 2020-08-23 ENCOUNTER — Other Ambulatory Visit: Payer: Self-pay

## 2020-08-23 DIAGNOSIS — I10 Essential (primary) hypertension: Secondary | ICD-10-CM

## 2020-08-23 DIAGNOSIS — E782 Mixed hyperlipidemia: Secondary | ICD-10-CM

## 2020-08-23 MED ORDER — EZETIMIBE 10 MG PO TABS: 10.0000 mg | ORAL_TABLET | Freq: Every day | ORAL | 3 refills | Status: DC

## 2020-08-23 MED ORDER — HYDROCHLOROTHIAZIDE 12.5 MG PO CAPS: ORAL_CAPSULE | ORAL | 1 refills | Status: DC

## 2020-08-28 ENCOUNTER — Other Ambulatory Visit: Payer: Self-pay

## 2020-08-28 ENCOUNTER — Encounter: Payer: Self-pay | Admitting: Hospice and Palliative Medicine

## 2020-08-28 ENCOUNTER — Ambulatory Visit (INDEPENDENT_AMBULATORY_CARE_PROVIDER_SITE_OTHER): Payer: PPO | Admitting: Hospice and Palliative Medicine

## 2020-08-28 VITALS — BP 163/84 | HR 75 | Resp 16 | Ht 66.5 in

## 2020-08-28 DIAGNOSIS — I10 Essential (primary) hypertension: Secondary | ICD-10-CM

## 2020-08-28 DIAGNOSIS — J0140 Acute pansinusitis, unspecified: Secondary | ICD-10-CM

## 2020-08-28 MED ORDER — HYDROCHLOROTHIAZIDE 12.5 MG PO CAPS: ORAL_CAPSULE | ORAL | 1 refills | Status: DC

## 2020-08-28 MED ORDER — PREDNISONE 10 MG PO TABS: ORAL_TABLET | ORAL | 0 refills | Status: DC

## 2020-08-28 MED ORDER — AZITHROMYCIN 250 MG PO TABS: ORAL_TABLET | ORAL | 0 refills | Status: DC

## 2020-08-28 NOTE — Progress Notes (Signed)
[facility] [place], [place] [address]  Internal MEDICINE  Telephone Visit  Patient Name: Toni [name]  [id number]  [id number]  Date of Service: 08/28/2020  I connected with the patient at 1148 by telephone and verified the patients identity using two identifiers.   I discussed the limitations, risks, security and privacy concerns of performing an evaluation and management service by telephone and the availability of in person appointments. I also discussed with the patient that there may be a patient responsible charge related to the service.  The patient expressed understanding and agrees to proceed.    Chief Complaint  Patient presents with  . Telephone Screen  . telephone assesment  . Nasal Congestion  . cough  . sinus    HPI Patient being seen today virtually for acute sick visit C/o sinus pain and pressure above left eye, congestion, cough and headaches Symptoms started 3-4 days ago Has been taking her allergy medication but are not helping with her symptoms Denies shortness of breath, fevers or body aches Tested positive for Influenza in December BP elevated today--denies chest pain  Current Medication: Outpatient Encounter Medications as of 08/28/2020  Medication Sig  . azithromycin (ZITHROMAX Z-PAK) 250 MG tablet Take two 250 mg tablets on day one followed by one 250 mg tablet each day for four days.  . predniSONE (DELTASONE) 10 MG tablet Take 1 tablet three times a day with a meal for three for three days, take 1 tablet by twice daily with a meal for 3 days, take 1 tablet once daily with a meal for 3 days  . aspirin 81 MG tablet Take 81 mg by mouth daily.  . Calcium Carbonate-Vitamin D (CALCIUM + D PO) Take 1 tablet by mouth daily.   . cetirizine (ZYRTEC) 10 MG tablet Take 10 mg by mouth as needed.   . cholecalciferol (VITAMIN D) 1000 units tablet Take 1,000 Units by mouth daily.  Marland Kitchen etodolac (LODINE) 400 MG tablet Take 1 tablet (400 mg  total) by mouth 2 (two) times daily.  Marland Kitchen ezetimibe (ZETIA) 10 MG tablet Take 1 tablet (10 mg total) by mouth daily.  . fenofibrate 54 MG tablet TAKE 1 TABLET BY MOUTH EVERY DAY FOR ELEVATED CHOLESTEROL  . hydrochlorothiazide (MICROZIDE) 12.5 MG capsule Take 2 capsules po QD  . olmesartan (BENICAR) 20 MG tablet Take 1 tablet (20 mg total) by mouth daily.  . pantoprazole (PROTONIX) 40 MG tablet Take 1 tablet (40 mg total) by mouth daily. t  . pneumococcal 23 valent vaccine (PNEUMOVAX 23) 25 MCG/0.5ML injection Inject 0.65ml IM once  . SYNTHROID 112 MCG tablet Take 1 tablet po QAM  . [DISCONTINUED] clarithromycin (BIAXIN) 500 MG tablet Take 1 tablet (500 mg total) by mouth 2 (two) times daily.   Facility-Administered Encounter Medications as of 08/28/2020  Medication  . 0.9 %  sodium chloride infusion    Surgical History: Past Surgical History:  Procedure Laterality Date  . APPENDECTOMY    . BUNIONECTOMY     right foot  . [facility]   . CATARACT EXTRACTION, BILATERAL Left 10/20/2017  . CATARACT EXTRACTION, BILATERAL Right 10/27/2017  . CHOLECYSTECTOMY    . COLONOSCOPY    . HAMMER TOE SURGERY  12/2012   left toe  . PARTIAL HYSTERECTOMY    . POLYPECTOMY    . ROBOTIC ASSISTED SALPINGO OOPHERECTOMY     right ovary first removed then left ovary removed years later  . TONSILLECTOMY  age 29  . TOTAL THYROIDECTOMY  09/2011    Medical History: Past Medical History:  Diagnosis Date  . Allergic rhinitis   . Allergy   . Aortic atherosclerosis ([place])   . Diabetes mellitus, type 2 (HCC)    no medicines- was pre diabtes- diet controlled   . Diverticulitis   . Essential hypertension   . GERD (gastroesophageal reflux disease)   . Hyperlipidemia   . Hypertension   . Hypothyroidism   . Obesity   . Osteoporosis   . Transient insomnia   . Tubular adenoma of colon 05/2013    Family History: Family History  Problem Relation Age of Onset  . Throat cancer  Father   . Esophageal cancer Father   . Colon cancer Maternal Aunt   . Colon cancer Maternal Uncle   . Colon polyps Maternal Aunt   . Colon polyps Maternal Uncle   . Heart disease Maternal Uncle   . Stomach cancer Maternal Uncle   . Prostate cancer Maternal Uncle   . Breast cancer Cousin   . Diabetes Neg Hx   . Kidney disease Neg Hx   . Gallbladder disease Neg Hx   . Rectal cancer Neg Hx     Social History   Socioeconomic History  . Marital status: Married    Spouse name: Not on file  . Number of children: 2  . Years of education: Not on file  . Highest education level: Not on file  Occupational History  . Occupation: Retired  Tobacco Use  . Smoking status: Former Smoker    Packs/day: 1.00    Years: 30.00    Pack years: 30.00    Types: Cigarettes    Quit date: 07/06/1998    Years since quitting: 22.1  . Smokeless tobacco: Never Used  Substance and Sexual Activity  . Alcohol use: No    Alcohol/week: 0.0 standard drinks  . Drug use: No  . Sexual activity: Not on file  Other Topics Concern  . Not on file  Social History Narrative  . Not on file   Social Determinants of Health   Financial Resource Strain: Not on file  Food Insecurity: Not on file  Transportation Needs: Not on file  Physical Activity: Not on file  Stress: Not on file  Social Connections: Not on file  Intimate Partner Violence: Not on file      Review of Systems  Constitutional: Negative for chills, diaphoresis and fatigue.  HENT: Positive for congestion, sinus pressure and sinus pain. Negative for ear pain and postnasal drip.   Eyes: Negative for photophobia, discharge, redness, itching and visual disturbance.  Respiratory: Positive for cough. Negative for shortness of breath and wheezing.   Cardiovascular: Negative for chest pain, palpitations and leg swelling.  Gastrointestinal: Negative for abdominal pain, constipation, diarrhea, nausea and vomiting.  Genitourinary: Negative for dysuria  and flank pain.  Musculoskeletal: Negative for arthralgias, back pain, gait problem and neck pain.  Skin: Negative for color change.  Allergic/Immunologic: Negative for environmental allergies and food allergies.  Neurological: Negative for dizziness and headaches.  Hematological: Does not bruise/bleed easily.  Psychiatric/Behavioral: Negative for agitation, behavioral problems (depression) and hallucinations.    Vital Signs: BP (!) 163/84   Pulse 75   Resp 16   Ht 5' 6.5" (1.689 m)   BMI 27.50 kg/m    Observation/Objective: Well appearing, no signs of acute/respiratory distress.   Assessment/Plan: 1. Acute non-recurrent pansinusitis Treat with ZPAK as well as short course prednisone taper Advised  to contact office if symptoms worsened or have not improved prior to the weekend - azithromycin (ZITHROMAX Z-PAK) 250 MG tablet; Take two 250 mg tablets on day one followed by one 250 mg tablet each day for four days.  Dispense: 6 each; Refill: 0 - predniSONE (DELTASONE) 10 MG tablet; Take 1 tablet three times a day with a meal for three for three days, take 1 tablet by twice daily with a meal for 3 days, take 1 tablet once daily with a meal for 3 days  Dispense: 18 tablet; Refill: 0  2. Essential hypertension BP elevated today--advised to continue monitoring readings at home Will follow-up in office in 2 weeks  General Counseling: rosene pilling understanding of the findings of today's phone visit and agrees with plan of treatment. I have discussed any further diagnostic evaluation that may be needed or ordered today. We also reviewed her medications today. she has been encouraged to call the office with any questions or concerns that should arise related to todays visit.  Meds ordered this encounter  Medications  . azithromycin (ZITHROMAX Z-PAK) 250 MG tablet    Sig: Take two 250 mg tablets on day one followed by one 250 mg tablet each day for four days.    Dispense:  6 each     Refill:  0  . predniSONE (DELTASONE) 10 MG tablet    Sig: Take 1 tablet three times a day with a meal for three for three days, take 1 tablet by twice daily with a meal for 3 days, take 1 tablet once daily with a meal for 3 days    Dispense:  18 tablet    Refill:  0     Time spent: 25 Minutes Time spent includes review of chart, medications, test results and follow-up plan with the patient.  Tanna Furry Neshawn Aird AGNP-C Internal medicine

## 2020-09-10 ENCOUNTER — Encounter: Payer: Self-pay | Admitting: Hospice and Palliative Medicine

## 2020-09-10 ENCOUNTER — Ambulatory Visit (INDEPENDENT_AMBULATORY_CARE_PROVIDER_SITE_OTHER): Payer: PPO | Admitting: Hospice and Palliative Medicine

## 2020-09-10 VITALS — BP 150/75 | HR 67 | Temp 97.8°F | Resp 16 | Ht 66.5 in | Wt 168.0 lb

## 2020-09-10 DIAGNOSIS — R7301 Impaired fasting glucose: Secondary | ICD-10-CM

## 2020-09-10 DIAGNOSIS — E782 Mixed hyperlipidemia: Secondary | ICD-10-CM | POA: Diagnosis not present

## 2020-09-10 DIAGNOSIS — E119 Type 2 diabetes mellitus without complications: Secondary | ICD-10-CM

## 2020-09-10 DIAGNOSIS — I10 Essential (primary) hypertension: Secondary | ICD-10-CM

## 2020-09-10 DIAGNOSIS — E039 Hypothyroidism, unspecified: Secondary | ICD-10-CM | POA: Diagnosis not present

## 2020-09-10 LAB — POCT GLYCOSYLATED HEMOGLOBIN (HGB A1C): Hemoglobin A1C: 6.6 % — AB (ref 4.0–5.6)

## 2020-09-10 MED ORDER — VALSARTAN-HYDROCHLOROTHIAZIDE 80-12.5 MG PO TABS: 1.0000 | ORAL_TABLET | Freq: Every day | ORAL | 1 refills | Status: DC

## 2020-09-10 NOTE — Progress Notes (Signed)
[facility] [place], [place] [address]  Internal MEDICINE  Office Visit Note  Patient Name: Toni [name]  [id number]  [id number]  Date of Service: 09/11/2020  Chief Complaint  Patient presents with  . Hypertension  . Hyperlipidemia  . Diabetes    HPI Patient is here for routine follow-up DM-unaware she needed to be concerned about her elevated A1C levels, understood that less than 7 was her goal, does not monitor glucose levels at home Hesitant about starting medications as her husband has been a diabetic for many years and she has watched him struggle with many side effects and negative effects from medications he has been on BP-insurance company has contacted her and is no longer providing coverage for olmesartan, advised to speak to provider about switching to valsartan as they provide coverage for this medication instead  Reviewed most recent labs--abnormal lipid panel/currently taking Zetia and tolerating well, vitamin D levels low/currently taking OTC supplements, thyroid levels stable on current dose of levothyroxine  Current Medication: Outpatient Encounter Medications as of 09/10/2020  Medication Sig  . aspirin 81 MG tablet Take 81 mg by mouth daily.  . Calcium Carbonate-Vitamin D (CALCIUM + D PO) Take 1 tablet by mouth daily.   . cetirizine (ZYRTEC) 10 MG tablet Take 10 mg by mouth as needed.   . cholecalciferol (VITAMIN D) 1000 units tablet Take 1,000 Units by mouth daily.  Marland Kitchen etodolac (LODINE) 400 MG tablet Take 1 tablet (400 mg total) by mouth 2 (two) times daily.  Marland Kitchen ezetimibe (ZETIA) 10 MG tablet Take 1 tablet (10 mg total) by mouth daily.  . fenofibrate 54 MG tablet TAKE 1 TABLET BY MOUTH EVERY DAY FOR ELEVATED CHOLESTEROL  . pantoprazole (PROTONIX) 40 MG tablet Take 1 tablet (40 mg total) by mouth daily. t  . pneumococcal 23 valent vaccine (PNEUMOVAX 23) 25 MCG/0.5ML injection Inject 0.34ml IM once  . SYNTHROID 112 MCG tablet Take 1 tablet  po QAM  . valsartan-hydrochlorothiazide (DIOVAN HCT) 80-12.5 MG tablet Take 1 tablet by mouth daily.  . [DISCONTINUED] azithromycin (ZITHROMAX Z-PAK) 250 MG tablet Take two 250 mg tablets on day one followed by one 250 mg tablet each day for four days.  . [DISCONTINUED] hydrochlorothiazide (MICROZIDE) 12.5 MG capsule Take 2 capsules po QD  . [DISCONTINUED] olmesartan (BENICAR) 20 MG tablet Take 1 tablet (20 mg total) by mouth daily.  . [DISCONTINUED] predniSONE (DELTASONE) 10 MG tablet Take 1 tablet three times a day with a meal for three for three days, take 1 tablet by twice daily with a meal for 3 days, take 1 tablet once daily with a meal for 3 days   Facility-Administered Encounter Medications as of 09/10/2020  Medication  . 0.9 %  sodium chloride infusion    Surgical History: Past Surgical History:  Procedure Laterality Date  . APPENDECTOMY    . BUNIONECTOMY     right foot  . [facility]   . CATARACT EXTRACTION, BILATERAL Left 10/20/2017  . CATARACT EXTRACTION, BILATERAL Right 10/27/2017  . CHOLECYSTECTOMY    . COLONOSCOPY    . HAMMER TOE SURGERY  12/2012   left toe  . PARTIAL HYSTERECTOMY    . POLYPECTOMY    . ROBOTIC ASSISTED SALPINGO OOPHERECTOMY     right ovary first removed then left ovary removed years later  . TONSILLECTOMY  age 27  . TOTAL THYROIDECTOMY  09/2011    Medical History: Past Medical History:  Diagnosis Date  .  Allergic rhinitis   . Allergy   . Aortic atherosclerosis ([place])   . Diabetes mellitus, type 2 (HCC)    no medicines- was pre diabtes- diet controlled   . Diverticulitis   . Essential hypertension   . GERD (gastroesophageal reflux disease)   . Hyperlipidemia   . Hypertension   . Hypothyroidism   . Obesity   . Osteoporosis   . Transient insomnia   . Tubular adenoma of colon 05/2013    Family History: Family History  Problem Relation Age of Onset  . Throat cancer Father   . Esophageal cancer Father    . Colon cancer Maternal Aunt   . Colon cancer Maternal Uncle   . Colon polyps Maternal Aunt   . Colon polyps Maternal Uncle   . Heart disease Maternal Uncle   . Stomach cancer Maternal Uncle   . Prostate cancer Maternal Uncle   . Breast cancer Cousin   . Diabetes Neg Hx   . Kidney disease Neg Hx   . Gallbladder disease Neg Hx   . Rectal cancer Neg Hx     Social History   Socioeconomic History  . Marital status: Married    Spouse name: Not on file  . Number of children: 2  . Years of education: Not on file  . Highest education level: Not on file  Occupational History  . Occupation: Retired  Tobacco Use  . Smoking status: Former Smoker    Packs/day: 1.00    Years: 30.00    Pack years: 30.00    Types: Cigarettes    Quit date: 07/06/1998    Years since quitting: 22.2  . Smokeless tobacco: Never Used  Substance and Sexual Activity  . Alcohol use: No    Alcohol/week: 0.0 standard drinks  . Drug use: No  . Sexual activity: Not on file  Other Topics Concern  . Not on file  Social History Narrative  . Not on file   Social Determinants of Health   Financial Resource Strain: Not on file  Food Insecurity: Not on file  Transportation Needs: Not on file  Physical Activity: Not on file  Stress: Not on file  Social Connections: Not on file  Intimate Partner Violence: Not on file      Review of Systems  Constitutional: Negative for chills, diaphoresis and fatigue.  HENT: Negative for ear pain, postnasal drip and sinus pressure.   Eyes: Negative for photophobia, discharge, redness, itching and visual disturbance.  Respiratory: Negative for cough, shortness of breath and wheezing.   Cardiovascular: Negative for chest pain, palpitations and leg swelling.  Gastrointestinal: Negative for abdominal pain, constipation, diarrhea, nausea and vomiting.  Genitourinary: Negative for dysuria and flank pain.  Musculoskeletal: Negative for arthralgias, back pain, gait problem and neck  pain.  Skin: Negative for color change.  Allergic/Immunologic: Negative for environmental allergies and food allergies.  Neurological: Negative for dizziness and headaches.  Hematological: Does not bruise/bleed easily.  Psychiatric/Behavioral: Negative for agitation, behavioral problems (depression) and hallucinations.    Vital Signs: BP (!) 150/75   Pulse 67   Temp 97.8 F (36.6 C)   Resp 16   Ht 5' 6.5" (1.689 m)   Wt 168 lb (76.2 kg)   SpO2 97%   BMI 26.71 kg/m    Physical Exam Vitals reviewed.  Constitutional:      Appearance: Normal appearance. She is normal weight.  Cardiovascular:     Rate and Rhythm: Normal rate and regular rhythm.     Pulses:  Normal pulses.     Heart sounds: Normal heart sounds.  Pulmonary:     Effort: Pulmonary effort is normal.     Breath sounds: Normal breath sounds.  Abdominal:     General: Abdomen is flat.     Palpations: Abdomen is soft.  Musculoskeletal:        General: Normal range of motion.     Cervical back: Normal range of motion.  Skin:    General: Skin is warm.  Neurological:     General: No focal deficit present.     Mental Status: She is alert and oriented to person, place, and time. Mental status is at baseline.  Psychiatric:        Mood and Affect: Mood normal.        Behavior: Behavior normal.        Thought Content: Thought content normal.        Judgment: Judgment normal.    Assessment/Plan: 1. Diet-controlled type 2 diabetes mellitus (HCC) A1C elevated at 6.6 today In depth education provided about meaning and understanding of A1C levels, appropriate A1C goals, risk of uncontrolled glucose levels Will hold off on started medication at this time due to her persistent hesitations Advised we will recheck levels in 3 months and medication will need to be strongly considered if there is further elevation in A1C levels Encouraged to visit ADA web site for diets and meal plans - POCT HgB A1C  2. Essential  hypertension Stop olmesartan and HCTZ and start Diovan due to insurance coverage, BP slightly elevated today May need dosing adjustment to Diovan--follow-up close - valsartan-hydrochlorothiazide (DIOVAN HCT) 80-12.5 MG tablet; Take 1 tablet by mouth daily.  Dispense: 90 tablet; Refill: 1  3. Acquired hypothyroidism Levels maintained on current dose of Synthroid, continue to montior  4. Mixed hyperlipidemia Continue with Zetia Consider further vascular imaging  General Counseling: waunetta riggle understanding of the findings of todays visit and agrees with plan of treatment. I have discussed any further diagnostic evaluation that may be needed or ordered today. We also reviewed her medications today. she has been encouraged to call the office with any questions or concerns that should arise related to todays visit.    Orders Placed This Encounter  Procedures  . POCT HgB A1C    Meds ordered this encounter  Medications  . valsartan-hydrochlorothiazide (DIOVAN HCT) 80-12.5 MG tablet    Sig: Take 1 tablet by mouth daily.    Dispense:  90 tablet    Refill:  1    Time spent: 30 Minutes Time spent includes review of chart, medications, test results and follow-up plan with the patient.  This patient was seen by Theodoro Grist AGNP-C in Collaboration with Dr Lavera Guise as a part of collaborative care agreement     Tanna Furry. Harris AGNP-C Internal medicine

## 2020-09-11 ENCOUNTER — Encounter: Payer: Self-pay | Admitting: Hospice and Palliative Medicine

## 2020-10-22 ENCOUNTER — Other Ambulatory Visit: Payer: Self-pay | Admitting: Nurse Practitioner

## 2020-11-02 ENCOUNTER — Other Ambulatory Visit: Payer: Self-pay | Admitting: Nurse Practitioner

## 2020-11-10 ENCOUNTER — Other Ambulatory Visit: Payer: Self-pay | Admitting: Nurse Practitioner

## 2020-11-13 ENCOUNTER — Other Ambulatory Visit: Payer: Self-pay | Admitting: Nurse Practitioner

## 2020-11-14 ENCOUNTER — Telehealth: Payer: Self-pay | Admitting: Internal Medicine

## 2020-11-14 NOTE — Progress Notes (Signed)
  Chronic Care Management   Note  11/14/2020 Name: Toni [name] MRN: [id number] DOB: 12-13-1945  [name] is a 75 y.o. year old female who is a primary care patient of Lavera Guise, MD. I reached out to Si Raider by phone today in response to a referral sent by Ms. Mcneil Sober Borah's PCP, Lavera Guise, MD.   [name] was given information about Chronic Care Management services today including:  1. CCM service includes personalized support from designated clinical staff supervised by her physician, including individualized plan of care and coordination with other care providers 2. 24/7 contact phone numbers for assistance for urgent and routine care needs. 3. Service will only be billed when office clinical staff spend 20 minutes or more in a month to coordinate care. 4. Only one practitioner may furnish and bill the service in a calendar month. 5. The patient may stop CCM services at any time (effective at the end of the month) by phone call to the office staff.   Patient agreed to services and verbal consent obtained.   Follow up plan:   Carley Perdue [employer] Scheduler

## 2020-11-23 ENCOUNTER — Other Ambulatory Visit: Payer: Self-pay | Admitting: Nurse Practitioner

## 2020-11-23 DIAGNOSIS — E039 Hypothyroidism, unspecified: Secondary | ICD-10-CM

## 2020-12-17 ENCOUNTER — Encounter: Payer: Self-pay | Admitting: Nurse Practitioner

## 2020-12-17 ENCOUNTER — Other Ambulatory Visit: Payer: Self-pay

## 2020-12-17 ENCOUNTER — Ambulatory Visit (INDEPENDENT_AMBULATORY_CARE_PROVIDER_SITE_OTHER): Payer: PPO | Admitting: Nurse Practitioner

## 2020-12-17 VITALS — BP 141/77 | HR 71 | Temp 98.3°F | Resp 16 | Ht 66.0 in | Wt 164.2 lb

## 2020-12-17 DIAGNOSIS — Z0189 Encounter for other specified special examinations: Secondary | ICD-10-CM

## 2020-12-17 DIAGNOSIS — E119 Type 2 diabetes mellitus without complications: Secondary | ICD-10-CM | POA: Diagnosis not present

## 2020-12-17 DIAGNOSIS — E039 Hypothyroidism, unspecified: Secondary | ICD-10-CM

## 2020-12-17 DIAGNOSIS — E782 Mixed hyperlipidemia: Secondary | ICD-10-CM | POA: Diagnosis not present

## 2020-12-17 DIAGNOSIS — I10 Essential (primary) hypertension: Secondary | ICD-10-CM | POA: Diagnosis not present

## 2020-12-17 DIAGNOSIS — E559 Vitamin D deficiency, unspecified: Secondary | ICD-10-CM | POA: Diagnosis not present

## 2020-12-17 LAB — POCT GLYCOSYLATED HEMOGLOBIN (HGB A1C): Hemoglobin A1C: 5.9 % — AB (ref 4.0–5.6)

## 2020-12-17 NOTE — Progress Notes (Signed)
[facility] [place], [place] [address]  Internal MEDICINE  Office Visit Note  Patient Name: Toni [name]  [id number]  [id number]  Date of Service: 12/27/2020  Chief Complaint  Patient presents with   Follow-up   Diabetes   Hypertension   Hypothyroidism    HPI Lihanna presents for a follow up visit for diabetes, hypertension, and hypothyroidism. A1C checked at today's visit. Her A1C was 5.9 today, which is improved from 6.6 in March 2022. She has not had routine labs done in several months.  -blood pressure is stable with current medications.  Currently taking ezetimibe and fenofibrate for her elevated cholesterol levels, no recent lipid panel, will recheck.  Patient has no other questions or concerns today. She denies any pain at this time.    Current Medication: Outpatient Encounter Medications as of 12/17/2020  Medication Sig Note   aspirin 81 MG tablet Take 81 mg by mouth daily.    Calcium Carbonate-Vitamin D (CALCIUM + D PO) Take 1 tablet by mouth daily.     cetirizine (ZYRTEC) 10 MG tablet Take 10 mg by mouth as needed.     cholecalciferol (VITAMIN D) 1000 units tablet Take 1,000 Units by mouth daily.    etodolac (LODINE) 400 MG tablet Take 1 tablet (400 mg total) by mouth 2 (two) times daily. (Patient not taking: Reported on 12/24/2020)    ezetimibe (ZETIA) 10 MG tablet Take 1 tablet (10 mg total) by mouth daily.    pantoprazole (PROTONIX) 40 MG tablet Take 1 tablet (40 mg total) by mouth daily. t    pneumococcal 23 valent vaccine (PNEUMOVAX 23) 25 MCG/0.5ML injection Inject 0.68m IM once    SYNTHROID 112 MCG tablet Take 1 tablet po QAM 12/24/2020: Take half tablet on wednesdays and Sundays   valsartan-hydrochlorothiazide (DIOVAN HCT) 80-12.5 MG tablet Take 1 tablet by mouth daily.    [DISCONTINUED] fenofibrate 552MG tablet TAKE 1 TABLET BY MOUTH EVERY DAY FOR ELEVATED CHOLESTEROL    Facility-Administered Encounter Medications as of 12/17/2020   Medication   0.9 %  sodium chloride infusion    Surgical History: Past Surgical History:  Procedure Laterality Date   APPENDECTOMY     BUNIONECTOMY     right foot   [facility]   CATARACT EXTRACTION, BILATERAL Left 10/20/2017   CATARACT EXTRACTION, BILATERAL Right 10/27/2017   CHOLECYSTECTOMY     COLONOSCOPY     HAMMER TOE SURGERY  12/2012   left toe   PARTIAL HYSTERECTOMY     POLYPECTOMY     ROBOTIC ASSISTED SALPINGO OOPHERECTOMY     right ovary first removed then left ovary removed years later   TONSILLECTOMY  age 75  TOTAL THYROIDECTOMY  09/2011    Medical History: Past Medical History:  Diagnosis Date   Allergic rhinitis    Allergy    Aortic atherosclerosis (HCC)    Diabetes mellitus, type 2 (HCC)    no medicines- was pre diabtes- diet controlled    Diverticulitis    Essential hypertension    GERD (gastroesophageal reflux disease)    Hyperlipidemia    Hypertension    Hypothyroidism    Obesity    Osteoporosis    Transient insomnia    Tubular adenoma of colon 05/2013    Family History: Family History  Problem Relation Age of Onset   Throat cancer Father    Esophageal cancer Father    Colon cancer Maternal Aunt  Colon polyps Maternal Aunt    Colon cancer Maternal Uncle    Colon polyps Maternal Uncle    Heart disease Maternal Uncle    Stomach cancer Maternal Uncle    Prostate cancer Maternal Uncle    Breast cancer Cousin    Diabetes Neg Hx    Kidney disease Neg Hx    Gallbladder disease Neg Hx    Rectal cancer Neg Hx     Social History   Socioeconomic History   Marital status: Married    Spouse name: Not on file   Number of children: 2   Years of education: Not on file   Highest education level: Not on file  Occupational History   Occupation: Retired  Tobacco Use   Smoking status: Former    Packs/day: 1.00    Years: 30.00    Pack years: 30.00    Types: Cigarettes    Quit date: 07/06/1998    Years  since quitting: 22.4   Smokeless tobacco: Never  Substance and Sexual Activity   Alcohol use: No    Alcohol/week: 0.0 standard drinks   Drug use: No   Sexual activity: Not on file  Other Topics Concern   Not on file  Social History Narrative   Not on file   Social Determinants of Health   Financial Resource Strain: Low Risk    Difficulty of Paying Living Expenses: Not hard at all  Food Insecurity: Not on file  Transportation Needs: Not on file  Physical Activity: Not on file  Stress: Not on file  Social Connections: Not on file  Intimate Partner Violence: Not on file      Review of Systems  Constitutional:  Negative for chills, fatigue and unexpected weight change.  HENT:  Negative for congestion, rhinorrhea, sneezing and sore throat.   Eyes:  Negative for redness.  Respiratory:  Negative for cough, chest tightness and shortness of breath.   Cardiovascular:  Negative for chest pain and palpitations.  Gastrointestinal:  Negative for abdominal pain, constipation, diarrhea, nausea and vomiting.  Genitourinary:  Negative for dysuria and frequency.  Musculoskeletal:  Negative for arthralgias, back pain, joint swelling and neck pain.  Skin:  Negative for rash.  Neurological: Negative.  Negative for tremors and numbness.  Hematological:  Negative for adenopathy. Does not bruise/bleed easily.  Psychiatric/Behavioral:  Negative for behavioral problems (Depression), sleep disturbance and suicidal ideas. The patient is not nervous/anxious.    Vital Signs: BP (!) 141/77   Pulse 71   Temp 98.3 F (36.8 C)   Resp 16   Ht '5\' 6"'  (1.676 m)   Wt 164 lb 3.2 oz (74.5 kg)   SpO2 97%   BMI 26.50 kg/m    Physical Exam Vitals reviewed.  Constitutional:      General: She is not in acute distress.    Appearance: Normal appearance. She is well-developed and normal weight. She is not ill-appearing or diaphoretic.  HENT:     Head: Normocephalic and atraumatic.  Neck:     Thyroid: No  thyromegaly.     Vascular: No JVD.     Trachea: No tracheal deviation.  Cardiovascular:     Rate and Rhythm: Normal rate and regular rhythm.     Pulses: Normal pulses.     Heart sounds: Normal heart sounds. No murmur heard.   No friction rub. No gallop.  Pulmonary:     Effort: Pulmonary effort is normal. No respiratory distress.     Breath sounds: Normal breath sounds.  No wheezing or rales.  Chest:     Chest wall: No tenderness.  Skin:    General: Skin is warm and dry.     Capillary Refill: Capillary refill takes less than 2 seconds.  Neurological:     Mental Status: She is alert and oriented to person, place, and time.  Psychiatric:        Mood and Affect: Mood normal.        Behavior: Behavior normal.    Assessment/Plan: 1. Diet-controlled type 2 diabetes mellitus ([place]) Patient has type 2 diabetes. She is not currently on any medications. She has had a significant decrease in her A1C from 6.6 in March to 5.9 today. No change in current treatment plan. Will recheck A1C in 3 months.  - POCT glycosylated hemoglobin (Hb A1C)  2. Essential hypertension Blood pressure stable with current medications  3. Acquired hypothyroidism Current taking synthroid 112 mcg. Recheck TSH and free T4.  - TSH + free T4  4. Mixed hyperlipidemia No recent lipid panel, currently taking ezetimibe and fenofibrate.  - Lipid Profile  5. Vitamin D deficiency History of low vitamin D, recheck level - Vitamin D (25 hydroxy)  6. Encounter for routine laboratory testing Routine labs ordered.  - CBC with Differential/Platelet - CMP14+EGFR    General Counseling: johnay mano understanding of the findings of todays visit and agrees with plan of treatment. I have discussed any further diagnostic evaluation that may be needed or ordered today. We also reviewed her medications today. she has been encouraged to call the office with any questions or concerns that should arise related to todays  visit.    Orders Placed This Encounter  Procedures   Lipid Profile   TSH + free T4   CBC with Differential/Platelet   CMP14+EGFR   Vitamin D (25 hydroxy)   POCT glycosylated hemoglobin (Hb A1C)    No orders of the defined types were placed in this encounter.   Return in about 3 months (around 03/19/2021) for F/U, Recheck A1C, Claryce Friel PCP.   Total time spent:30 Minutes Time spent includes review of chart, medications, test results, and follow up plan with the patient.   [place] Controlled Substance Database was reviewed by me.  This patient was seen by [name], FNP-C in collaboration with Dr. Clayborn Bigness as a part of collaborative care agreement.   Adalay Azucena R. Valetta Fuller, MSN, FNP-C Internal medicine

## 2020-12-23 ENCOUNTER — Telehealth: Payer: Self-pay | Admitting: Pharmacist

## 2020-12-23 DIAGNOSIS — E119 Type 2 diabetes mellitus without complications: Secondary | ICD-10-CM | POA: Diagnosis not present

## 2020-12-23 DIAGNOSIS — E039 Hypothyroidism, unspecified: Secondary | ICD-10-CM | POA: Diagnosis not present

## 2020-12-23 DIAGNOSIS — E559 Vitamin D deficiency, unspecified: Secondary | ICD-10-CM | POA: Diagnosis not present

## 2020-12-23 DIAGNOSIS — E782 Mixed hyperlipidemia: Secondary | ICD-10-CM | POA: Diagnosis not present

## 2020-12-23 NOTE — Progress Notes (Addendum)
    Chronic Care Management Pharmacy Assistant   Name: WANETTE ROBISON  MRN: [id number] DOB: 03-02-1946  Toni [name] is an 75 y.o. year old female who presents for his initial CCM visit with the clinical pharmacist.  Reason for Encounter: Chart Prep   Conditions to be addressed/monitored: HTN, HLD.  Primary concerns for visit include: HTN, HLD.  Recent office visits:  12/17/20 [name], NP. For follow-up. No medication changes.  09/10/20 Luiz Ochoa, NP. For follow-up. STARTED Valsartan-Hydrochlorothiazide 80-12.5 mg daily. STOPPED Hydrochlorothiazide, Olmesartan. 08/28/20 Luiz Ochoa, NP. For follow-up. STARTED Azithromycin 250 mg daily for 4 days and Prednisone 10 mg pack. STOPPED Clarithromycin.  Recent consult visits:  None in the last six months.  Hospital visits:  None in previous 6 months  Medication History: Valsartan-Hydrochlorothiazide 80-12.5 mg 90 DS 12/06/20  Medications: Outpatient Encounter Medications as of 12/23/2020  Medication Sig   aspirin 81 MG tablet Take 81 mg by mouth daily.   Calcium Carbonate-Vitamin D (CALCIUM + D PO) Take 1 tablet by mouth daily.    cetirizine (ZYRTEC) 10 MG tablet Take 10 mg by mouth as needed.    cholecalciferol (VITAMIN D) 1000 units tablet Take 1,000 Units by mouth daily.   etodolac (LODINE) 400 MG tablet Take 1 tablet (400 mg total) by mouth 2 (two) times daily.   ezetimibe (ZETIA) 10 MG tablet Take 1 tablet (10 mg total) by mouth daily.   fenofibrate 54 MG tablet TAKE 1 TABLET BY MOUTH EVERY DAY FOR ELEVATED CHOLESTEROL   pantoprazole (PROTONIX) 40 MG tablet Take 1 tablet (40 mg total) by mouth daily. t   pneumococcal 23 valent vaccine (PNEUMOVAX 23) 25 MCG/0.5ML injection Inject 0.22ml IM once   SYNTHROID 112 MCG tablet Take 1 tablet po QAM   valsartan-hydrochlorothiazide (DIOVAN HCT) 80-12.5 MG tablet Take 1 tablet by mouth daily.   Facility-Administered Encounter Medications as of 12/23/2020  Medication    0.9 %  sodium chloride infusion    Have you seen any other providers since your last visit? Patient stated no.  Any changes in your medications or health? Patient stated no.  Any side effects from any medications? Patient stated her Valsartan-Hydrochlorothiazide is causing her to cough.  Do you have an symptoms or problems not managed by your medications? Patient stated no.  Any concerns about your health right now? Patient stated no.  Has your provider asked that you check blood pressure, blood sugar, or follow special diet at home? Patient stated no.  Do you get any type of exercise on a regular basis? Patient stated once a week but she is pretty active outside of her once a week workout.  Can you think of a goal you would like to reach for your health? Patient stated she would like to loose to weight.  Do you have any problems getting your medications? Patient stated her synthroid is costly because she takes name brand.   Is there anything that you would like to discuss during the appointment? Patient stated she would like to know if there is anything else that can replace her Valsartan-Hydrochlorothiazide and is covered by her insurance.  Please bring medications and supplements to appointment, patient reminded of her face to face appointment on 12/24/20 at 11 am.  [place], [place] Pharmacist Assistant [phone]

## 2020-12-23 NOTE — Progress Notes (Signed)
Chronic Care Management Pharmacy Note  12/24/2020 Name:  Toni [name] MRN:  [id number] DOB:  [birth year]  Summary: Initial visit with pharmD.  LDL continues to increase, patient has tried all statins.  She is tolerating Zetia fine.  She has not taken fenofibrate in over one month due to issues getting refills, this explains increase in Tgs.  Reported one home BP of 141/71.  Recommendations/Changes made from today's visit: Consider Repatha or Praluent for LDL. She has tried before but some years ago so insurance coverage may have changed.  Monitor BP if continues to be elevated may need to increase Valsartan/HCTZ.  Plan: FU on BP next month  FU phone with pharmD in 3 months   Subjective: Toni [name] is an 75 y.o. year old female who is a primary patient of Humphrey Rolls, Timoteo Gaul, MD.  The CCM team was consulted for assistance with disease management and care coordination needs.    Engaged with patient face to face for initial visit in response to provider referral for pharmacy case management and/or care coordination services.   Consent to Services:  The patient was given the following information about Chronic Care Management services today, agreed to services, and gave verbal consent: 1. CCM service includes personalized support from designated clinical staff supervised by the primary care provider, including individualized plan of care and coordination with other care providers 2. 24/7 contact phone numbers for assistance for urgent and routine care needs. 3. Service will only be billed when office clinical staff spend 20 minutes or more in a month to coordinate care. 4. Only one practitioner may furnish and bill the service in a calendar month. 5.The patient may stop CCM services at any time (effective at the end of the month) by phone call to the office staff. 6. The patient will be responsible for cost sharing (co-pay) of up to 20% of the service fee (after annual deductible is met).  Patient agreed to services and consent obtained.  Patient Care Team: Lavera Guise, MD as PCP - General (Internal Medicine) Edythe Clarity, [facility] as Pharmacist (Pharmacist)  Recent office visits:  12/17/20 [name], NP. For follow-up. No medication changes. 09/10/20 Luiz Ochoa, NP. For follow-up. STARTED Valsartan-Hydrochlorothiazide 80-12.5 mg daily. STOPPED Hydrochlorothiazide, Olmesartan. 08/28/20 Luiz Ochoa, NP. For follow-up. STARTED Azithromycin 250 mg daily for 4 days and Prednisone 10 mg pack. STOPPED Clarithromycin.   Recent consult visits:  None in the last six months.   Hospital visits:  None in previous 6 months   Medication History: Valsartan-Hydrochlorothiazide 80-12.5 mg 90 DS 12/06/20     Objective:  Lab Results  Component Value Date   CREATININE 0.79 12/23/2020   BUN 17 12/23/2020   GFRNONAA 75 05/27/2020   GFRAA 86 05/27/2020   NA 142 12/23/2020   K 4.0 12/23/2020   CALCIUM 9.7 12/23/2020   CO2 28 12/23/2020   GLUCOSE 125 (H) 12/23/2020    Lab Results  Component Value Date/Time   HGBA1C 5.9 (A) 12/17/2020 12:15 PM   HGBA1C 6.6 (A) 09/10/2020 11:40 AM   HGBA1C 6.1 (H) 05/12/2019 08:11 AM   HGBA1C 5.9 (H) 04/28/2018 08:48 AM    Last diabetic Eye exam: No results found for: HMDIABEYEEXA  Last diabetic Foot exam: No results found for: HMDIABFOOTEX   Lab Results  Component Value Date   CHOL 286 (H) 12/23/2020   HDL 52 12/23/2020   LDLCALC 189 (H) 12/23/2020   TRIG 233 (H) 12/23/2020   CHOLHDL 5.5 (  H) 12/23/2020    Hepatic Function Latest Ref Rng & Units 12/23/2020 05/27/2020 11/24/2019  Total Protein 6.0 - 8.5 g/dL 6.6 6.4 6.6  Albumin 3.7 - 4.7 g/dL 4.5 4.3 4.4  AST 0 - 40 IU/L '24 21 24  ' ALT 0 - 32 IU/L 26 28 32  Alk Phosphatase 44 - 121 IU/L 53 57 54  Total Bilirubin 0.0 - 1.2 mg/dL 1.0 0.6 1.1    Lab Results  Component Value Date/Time   TSH 0.434 (L) 12/23/2020 07:07 AM   TSH 0.358 (L) 05/27/2020 09:20 AM   FREET4  1.58 12/23/2020 07:07 AM   FREET4 1.59 05/27/2020 09:20 AM    CBC Latest Ref Rng & Units 12/23/2020 05/27/2020 05/12/2019  WBC 3.4 - 10.8 x10E3/uL 5.3 4.2 8.7  Hemoglobin 11.1 - 15.9 g/dL 15.6 15.1 15.5  Hematocrit 34.0 - 46.6 % 46.4 44.3 45.1  Platelets 150 - 450 x10E3/uL 183 221 278    Lab Results  Component Value Date/Time   VD25OH 30.9 12/23/2020 07:07 AM   VD25OH 22.5 (L) 05/27/2020 09:20 AM    Clinical ASCVD: No  The 10-year ASCVD risk score Mikey Bussing DC Jr., et al., 2013) is: 35.1%   Values used to calculate the score:     Age: 29 years     Sex: Female     Is Non-Hispanic African American: No     Diabetic: Yes     Tobacco smoker: No     Systolic Blood Pressure: 324 mmHg     Is BP treated: Yes     HDL Cholesterol: 52 mg/dL     Total Cholesterol: 286 mg/dL    Depression screen [facility] 2/9 09/10/2020 05/13/2020 05/13/2020  Decreased Interest 0 0 0  Down, Depressed, Hopeless 0 0 0  PHQ - 2 Score 0 0 0      Social History   Tobacco Use  Smoking Status Former   Packs/day: 1.00   Years: 30.00   Pack years: 30.00   Types: Cigarettes   Quit date: 07/06/1998   Years since quitting: 22.4  Smokeless Tobacco Never   BP Readings from Last 3 Encounters:  12/17/20 (!) 141/77  09/10/20 (!) 150/75  08/28/20 (!) 163/84   Pulse Readings from Last 3 Encounters:  12/17/20 71  09/10/20 67  08/28/20 75   Wt Readings from Last 3 Encounters:  12/17/20 164 lb 3.2 oz (74.5 kg)  09/10/20 168 lb (76.2 kg)  06/11/20 173 lb (78.5 kg)   BMI Readings from Last 3 Encounters:  12/17/20 26.50 kg/m  09/10/20 26.71 kg/m  08/28/20 27.50 kg/m    Assessment/Interventions: Review of patient past medical history, allergies, medications, health status, including review of consultants reports, laboratory and other test data, was performed as part of comprehensive evaluation and provision of chronic care management services.   SDOH:  (Social Determinants of Health) assessments and interventions  performed: Yes  Financial Resource Strain: Not on file    SDOH Screenings   Alcohol Screen: Not on file  Depression (PHQ2-9): Low Risk    PHQ-2 Score: 0  Financial Resource Strain: Not on file  Food Insecurity: Not on file  Housing: Not on file  Physical Activity: Not on file  Social Connections: Not on file  Stress: Not on file  Tobacco Use: Medium Risk   Smoking Tobacco Use: Former   Smokeless Tobacco Use: Never  Transportation Needs: Not on file    [place]  Allergies  Allergen Reactions   Cefaclor  REACTION: swelling/hives   Penicillins     REACTION: swelling/hives   Talwin [Pentazocine]     Medications Reviewed Today     Reviewed by Tora Perches, CMA (Certified Medical Assistant) on 12/17/20 at 1129  Med List Status: <None>   Medication Order Taking? Sig Documenting Provider Last Dose Status Informant  0.9 %  sodium chloride infusion [id number]   Ladene Artist, MD  Active   aspirin 81 MG tablet [id number] Yes Take 81 mg by mouth daily. [provider] Taking Active Self  Calcium Carbonate-Vitamin D (CALCIUM + D PO) [id number] Yes Take 1 tablet by mouth daily.  [provider] Taking Active Self  cetirizine (ZYRTEC) 10 MG tablet [id number] Yes Take 10 mg by mouth as needed.  [provider] Taking Active   cholecalciferol (VITAMIN D) 1000 units tablet [id number] Yes Take 1,000 Units by mouth daily. [provider] Taking Active   etodolac (LODINE) 400 MG tablet [id number] Yes Take 1 tablet (400 mg total) by mouth 2 (two) times daily. Ronnell Freshwater, NP Taking Active   ezetimibe (ZETIA) 10 MG tablet [id number] Yes Take 1 tablet (10 mg total) by mouth daily. Lavera Guise, MD Taking Active   fenofibrate 54 MG tablet [id number] Yes TAKE 1 TABLET BY MOUTH EVERY DAY FOR ELEVATED CHOLESTEROL Ronnell Freshwater, NP Taking Active   pantoprazole (PROTONIX) 40 MG tablet [id number] Yes Take 1 tablet (40 mg total) by mouth daily. t  Lavera Guise, MD Taking Active   pneumococcal 23 valent vaccine (PNEUMOVAX 23) 25 MCG/0.5ML injection [id number] Yes Inject 0.28m IM once BRonnell Freshwater NP Taking Active   SYNTHROID 112 MCG tablet [id number] Take 1 tablet po QAM Boscia, Heather E, NP Taking Active   valsartan-hydrochlorothiazide (DIOVAN HCT) 80-12.5 MG tablet [id number] Take 1 tablet by mouth daily. HLuiz Ochoa NP Taking Active             Patient Active Problem List   Diagnosis Date Noted   Needs flu shot 06/02/2020   Need for vaccination against Streptococcus pneumoniae using pneumococcal conjugate vaccine 13 06/02/2020   Pre-diabetes 11/25/2019   Lumbar back pain with radiculopathy affecting lower extremity 11/25/2019   Left sided sciatica 05/24/2019   Encounter for general adult medical examination with abnormal findings 05/02/2018   Cough 05/02/2018   Hypothyroidism 01/14/2018   Urinary tract infection with hematuria 01/14/2018   Dysuria 01/14/2018   Acute recurrent pansinusitis 01/14/2018   Atypical chest pain 12/13/2014   Aortic atherosclerosis ([place]    Mixed hyperlipidemia    Essential hypertension    Diverticulitis of colon without hemorrhage 11/13/2014    Immunization History  Administered Date(s) Administered   Influenza Inj Mdck Quad Pf 03/23/2019, 05/13/2020   Influenza, High Dose Seasonal PF 04/09/2017, 07/30/2018, 03/23/2019   Influenza-Unspecified 07/30/2018   PFIZER(Purple Top)SARS-COV-2 Vaccination 09/19/2019, 10/17/2019, 06/04/2020   Pneumococcal Conjugate-13 04/09/2017   Pneumococcal Polysaccharide-23 05/13/2020   Zoster Recombinat (Shingrix) 03/23/2019, 05/28/2019    Conditions to be addressed/monitored:  HTN, Hypothyroidism, HLD, Back Pain, Pre-DM  There are no care plans that you recently modified to display for this patient.    Medication Assistance: None required.  Patient affirms current coverage meets needs.  Compliance/Adherence/Medication fill  history: Care Gaps: Tdap  Star-Rating Drugs: Valsartan-Hydrochlorothiazide 80-12.5 mg 90 DS 12/06/20  Patient[facility]  [id number] [place] [place]one: [phone]: [phone] pill box? Yes Pt endorses  100% compliance  We discussed: Benefits of medication synchronization, packaging and delivery as well as enhanced pharmacist oversight with [employer]. Patient decided to: Continue current medication management strategy  Care Plan and Follow Up Patient Decision:  Patient agrees to Care Plan and Follow-up.  Plan: The care management team will reach out to the patient again over the next 30 days.  Beverly Milch, PharmD Clinical Pharmacist [facility] [phone]

## 2020-12-24 ENCOUNTER — Other Ambulatory Visit: Payer: Self-pay

## 2020-12-24 ENCOUNTER — Ambulatory Visit: Payer: PPO | Admitting: Pharmacist

## 2020-12-24 DIAGNOSIS — E782 Mixed hyperlipidemia: Secondary | ICD-10-CM

## 2020-12-24 DIAGNOSIS — E039 Hypothyroidism, unspecified: Secondary | ICD-10-CM

## 2020-12-24 DIAGNOSIS — I10 Essential (primary) hypertension: Secondary | ICD-10-CM

## 2020-12-24 DIAGNOSIS — R7303 Prediabetes: Secondary | ICD-10-CM

## 2020-12-24 LAB — CMP14+EGFR
ALT: 26 IU/L (ref 0–32)
AST: 24 IU/L (ref 0–40)
Albumin/Globulin Ratio: 2.1 (ref 1.2–2.2)
Albumin: 4.5 g/dL (ref 3.7–4.7)
Alkaline Phosphatase: 53 IU/L (ref 44–121)
BUN/Creatinine Ratio: 22 (ref 12–28)
BUN: 17 mg/dL (ref 8–27)
Bilirubin Total: 1 mg/dL (ref 0.0–1.2)
CO2: 28 mmol/L (ref 20–29)
Calcium: 9.7 mg/dL (ref 8.7–10.3)
Chloride: 103 mmol/L (ref 96–106)
Creatinine, Ser: 0.79 mg/dL (ref 0.57–1.00)
Globulin, Total: 2.1 g/dL (ref 1.5–4.5)
Glucose: 125 mg/dL — ABNORMAL HIGH (ref 65–99)
Potassium: 4 mmol/L (ref 3.5–5.2)
Sodium: 142 mmol/L (ref 134–144)
Total Protein: 6.6 g/dL (ref 6.0–8.5)
eGFR: 79 mL/min/{1.73_m2} (ref >59)

## 2020-12-24 LAB — CBC WITH DIFFERENTIAL/PLATELET
Basophils Absolute: 0.1 10*3/uL (ref 0.0–0.2)
Basos: 1 %
EOS (ABSOLUTE): 0.2 10*3/uL (ref 0.0–0.4)
Eos: 3 %
Hematocrit: 46.4 % (ref 34.0–46.6)
Hemoglobin: 15.6 g/dL (ref 11.1–15.9)
Immature Grans (Abs): 0 10*3/uL (ref 0.0–0.1)
Immature Granulocytes: 0 %
Lymphocytes Absolute: 1.6 10*3/uL (ref 0.7–3.1)
Lymphs: 30 %
MCH: 30.1 pg (ref 26.6–33.0)
MCHC: 33.6 g/dL (ref 31.5–35.7)
MCV: 90 fL (ref 79–97)
Monocytes Absolute: 0.6 10*3/uL (ref 0.1–0.9)
Monocytes: 11 %
Neutrophils Absolute: 2.9 10*3/uL (ref 1.4–7.0)
Neutrophils: 55 %
Platelets: 183 10*3/uL (ref 150–450)
RBC: 5.18 x10E6/uL (ref 3.77–5.28)
RDW: 12.1 % (ref 11.7–15.4)
WBC: 5.3 10*3/uL (ref 3.4–10.8)

## 2020-12-24 LAB — LIPID PANEL
Chol/HDL Ratio: 5.5 ratio — ABNORMAL HIGH (ref 0.0–4.4)
Cholesterol, Total: 286 mg/dL — ABNORMAL HIGH (ref 100–199)
HDL: 52 mg/dL (ref >39)
LDL Chol Calc (NIH): 189 mg/dL — ABNORMAL HIGH (ref 0–99)
Triglycerides: 233 mg/dL — ABNORMAL HIGH (ref 0–149)
VLDL Cholesterol Cal: 45 mg/dL — ABNORMAL HIGH (ref 5–40)

## 2020-12-24 LAB — TSH+FREE T4
Free T4: 1.58 ng/dL (ref 0.82–1.77)
TSH: 0.434 u[IU]/mL — ABNORMAL LOW (ref 0.450–4.500)

## 2020-12-24 LAB — VITAMIN D 25 HYDROXY (VIT D DEFICIENCY, FRACTURES): Vit D, 25-Hydroxy: 30.9 ng/mL (ref 30.0–100.0)

## 2020-12-24 MED ORDER — FENOFIBRATE 54 MG PO TABS: ORAL_TABLET | ORAL | 1 refills | Status: DC

## 2020-12-24 NOTE — Patient Instructions (Addendum)
Visit Information   Goals Addressed             This Visit's Progress    Track and Manage My Blood Pressure-Hypertension       Timeframe:  Long-Range Goal Priority:  High Start Date:  12/24/20                           Expected End Date:  12/21/212                     Follow Up Date 04/04/21    - check blood pressure 3 times per week - choose a place to take my blood pressure (home, clinic or office, retail store) - write blood pressure results in a log or diary    Why is this important?   You won't feel high blood pressure, but it can still hurt your blood vessels.  High blood pressure can cause heart or kidney problems. It can also cause a stroke.  Making lifestyle changes like losing a little weight or eating less salt will help.  Checking your blood pressure at home and at different times of the day can help to control blood pressure.  If the doctor prescribes medicine remember to take it the way the doctor ordered.  Call the office if you cannot afford the medicine or if there are questions about it.     Notes:         Patient Care Plan: General Pharmacy (Adult)     Problem Identified: HTN, Hypothyroidism, HLD, Back Pain, Pre-DM   Priority: High  Onset Date: 12/24/2020  Note:   Current Barriers:  Unable to achieve control of cholesterol   Pharmacist Clinical Goal(s):  Patient will achieve adherence to monitoring guidelines and medication adherence to achieve therapeutic efficacy achieve control of cholesterol as evidenced by lipid panel adhere to plan to optimize therapeutic regimen for lipids as evidenced by report of adherence to recommended medication management changes contact provider office for questions/concerns as evidenced notation of same in electronic health record through collaboration with PharmD and provider.   Interventions: 1:1 collaboration with Lavera Guise, MD regarding development and update of comprehensive plan of care as evidenced by  provider attestation and co-signature Inter-disciplinary care team collaboration (see longitudinal plan of care) Comprehensive medication review performed; medication list updated in electronic medical record  Hypertension (BP goal <140/90) -Controlled -Current treatment: Valsartan/HCTZ 80-12.5mg  daily -Medications previously tried: olmesartan, diltiazem  -Current home readings: 141/71, no logs today -Current dietary habits: see DM -Current exercise habits: very active mowing lawn, swims in backyard pool, walking -Denies hypotensive/hypertensive symptoms -Educated on BP goals and benefits of medications for prevention of heart attack, stroke and kidney damage; Exercise goal of 150 minutes per week; Importance of home blood pressure monitoring; Symptoms of hypotension and importance of maintaining adequate hydration; -Counseled to monitor BP at home 3 times weekly, document, and provide log at future appointments -Recommended to continue current medication -Will f/u on BP readings next month.  Hyperlipidemia: (LDL goal < 100) -Uncontrolled -Current treatment: Zetia 10mg  Fenofibrate 54mg   -Medications previously tried: ALL STATINS (elevated LFTs and cramping)  -Current dietary patterns: see DM -Current exercise habits: see above -Educated on Cholesterol goals;  Importance of limiting foods high in cholesterol; Exercise goal of 150 minutes per week; -She has tried all statins and had elevated LFTs, she has also tried Repatha but recalls high copay.  This was some time ago.  Most recent lipid panel showed increased LDL and TG's.  However, she has not been on Fenofibrate for about one month due to communication issue with refills.  This explains her elevated TG.  LDL continues to increase despite Zetia.  Only option at this point would be to try Repatha or Praluent.  Will reach out to insurance to see which one is covered -Counseled on diet and exercise extensively Recommended to  continue current medication Will consult with PCP and review best potential injectable option for patient.  Pre-Diabetes (A1c goal <6.5%) -Controlled -Current medications: None currently -Medications previously tried: none  -Current home glucose readings fasting glucose: not checking post prandial glucose: not checking -Denies hypoglycemic/hyperglycemic symptoms -Current meal patterns:  breakfast: oatmeal, cheerios, occasional eggs lunch: lots of vegetables  dinner:  snacks:  drinks: water -Current exercise: see above -Educated on A1c and blood sugar goals; Exercise goal of 150 minutes per week; Congratulated on recent improvements with A1c -Counseled to check feet daily and get yearly eye exams -Recommended continue current management strategyies  Hypothyroidism (Goal: Maintain TSH) -Not ideally controlled -Current treatment  Synthroid 146mcg daily - except half tablet on Wed and Sunday -Medications previously tried: none noted  -Most recent TSH still slightly below goal.  She reports that she feels tired.  Counseled on proper administration of medication 30 minutes before food or other meds on empty stomach.  I feel it is best she continues as is for now since it is so close to goal.  Will continue to monitor TSH. -Recommended to continue current medication  Patient Goals/Self-Care Activities Patient will:  - take medications as prescribed focus on medication adherence by pill box check blood pressure 3 times per week, document, and provide at future appointments  Follow Up Plan: The care management team will reach out to the patient again over the next 30 days.      Goal: Patient-Specific Goal       Toni [name] was given information about Chronic Care Management services today including:  CCM service includes personalized support from designated clinical staff supervised by her physician, including individualized plan of care and coordination with other care  providers 24/7 contact phone numbers for assistance for urgent and routine care needs. Standard insurance, coinsurance, copays and deductibles apply for chronic care management only during months in which we provide at least 20 minutes of these services. Most insurances cover these services at 100%, however patients may be responsible for any copay, coinsurance and/or deductible if applicable. This service may help you avoid the need for more expensive face-to-face services. Only one practitioner may furnish and bill the service in a calendar month. The patient may stop CCM services at any time (effective at the end of the month) by phone call to the office staff.  Patient agreed to services and verbal consent obtained.   Patient verbalizes understanding of instructions provided today and agrees to view in [place].  Telephone follow up appointment with pharmacy team member scheduled for: 3 months  Edythe Clarity, [place]

## 2021-01-02 DIAGNOSIS — E039 Hypothyroidism, unspecified: Secondary | ICD-10-CM | POA: Diagnosis not present

## 2021-01-02 DIAGNOSIS — I10 Essential (primary) hypertension: Secondary | ICD-10-CM | POA: Diagnosis not present

## 2021-01-02 DIAGNOSIS — E782 Mixed hyperlipidemia: Secondary | ICD-10-CM | POA: Diagnosis not present

## 2021-01-14 ENCOUNTER — Other Ambulatory Visit: Payer: Self-pay | Admitting: Nurse Practitioner

## 2021-01-14 ENCOUNTER — Other Ambulatory Visit: Payer: Self-pay | Admitting: Internal Medicine

## 2021-01-14 ENCOUNTER — Other Ambulatory Visit: Payer: Self-pay

## 2021-01-14 DIAGNOSIS — E782 Mixed hyperlipidemia: Secondary | ICD-10-CM

## 2021-01-14 DIAGNOSIS — E039 Hypothyroidism, unspecified: Secondary | ICD-10-CM

## 2021-01-14 DIAGNOSIS — I10 Essential (primary) hypertension: Secondary | ICD-10-CM

## 2021-01-14 MED ORDER — VALSARTAN-HYDROCHLOROTHIAZIDE 80-12.5 MG PO TABS: 1.0000 | ORAL_TABLET | Freq: Every day | ORAL | 1 refills | Status: DC

## 2021-01-15 ENCOUNTER — Other Ambulatory Visit: Payer: Self-pay

## 2021-01-15 ENCOUNTER — Telehealth: Payer: Self-pay

## 2021-01-15 DIAGNOSIS — E039 Hypothyroidism, unspecified: Secondary | ICD-10-CM

## 2021-01-15 DIAGNOSIS — Z1231 Encounter for screening mammogram for malignant neoplasm of breast: Secondary | ICD-10-CM | POA: Diagnosis not present

## 2021-01-15 MED ORDER — SYNTHROID 112 MCG PO TABS: ORAL_TABLET | ORAL | 3 refills | Status: DC

## 2021-01-15 NOTE — Telephone Encounter (Signed)
Send med to phar  

## 2021-01-15 NOTE — Telephone Encounter (Signed)
-----   Message from Edythe Clarity, [facility] sent at 01/14/2021  4:40 PM EDT ----- Marykay Lex!  She [facility]  Mentions something about using mail order which caused her to be out of refills.  Thanks!  Gerald Stabs

## 2021-01-23 ENCOUNTER — Telehealth: Payer: Self-pay | Admitting: Pharmacist

## 2021-01-23 NOTE — Progress Notes (Addendum)
Chronic Care Management Pharmacy Assistant   Name: Toni [name]  MRN: [id number] DOB: Nov 12, 1945  Reason for Encounter: Disease State For HTN.   Conditions to be addressed/monitored: HTN, Hypothyroidism, HLD, Back Pain, Pre-DM  Recent office visits:  None since 12/24/20  Recent consult visits:  None since 12/24/20  Hospital visits:  None since 12/2120  Medications: Outpatient Encounter Medications as of 01/23/2021  Medication Sig   aspirin 81 MG tablet Take 81 mg by mouth daily.   Calcium Carbonate-Vitamin D (CALCIUM + D PO) Take 1 tablet by mouth daily.    cetirizine (ZYRTEC) 10 MG tablet Take 10 mg by mouth as needed.    cholecalciferol (VITAMIN D) 1000 units tablet Take 1,000 Units by mouth daily.   ezetimibe (ZETIA) 10 MG tablet TAKE 1 TABLET BY MOUTH EVERY DAY   fenofibrate 54 MG tablet TAKE 1 TABLET BY MOUTH EVERY DAY FOR ELEVATED CHOLESTEROL   pantoprazole (PROTONIX) 40 MG tablet Take 1 tablet (40 mg total) by mouth daily. t   pneumococcal 23 valent vaccine (PNEUMOVAX 23) 25 MCG/0.5ML injection Inject 0.12ml IM once   SYNTHROID 112 MCG tablet Take 1 tablet po QAM   valsartan-hydrochlorothiazide (DIOVAN HCT) 80-12.5 MG tablet Take 1 tablet by mouth daily.   Facility-Administered Encounter Medications as of 01/23/2021  Medication   0.9 %  sodium chloride infusion   Reviewed chart prior to disease state call. Spoke with patient regarding BP  Recent Office Vitals: BP Readings from Last 3 Encounters:  12/17/20 (!) 141/77  09/10/20 (!) 150/75  08/28/20 (!) 163/84   Pulse Readings from Last 3 Encounters:  12/17/20 71  09/10/20 67  08/28/20 75    Wt Readings from Last 3 Encounters:  12/17/20 164 lb 3.2 oz (74.5 kg)  09/10/20 168 lb (76.2 kg)  06/11/20 173 lb (78.5 kg)     Kidney Function Lab Results  Component Value Date/Time   CREATININE 0.79 12/23/2020 07:07 AM   CREATININE 0.79 05/27/2020 09:20 AM   CREATININE 0.97 09/02/2011 08:11 AM   GFRNONAA 75  05/27/2020 09:20 AM   GFRNONAA >60 09/02/2011 08:11 AM   GFRAA 86 05/27/2020 09:20 AM   GFRAA >60 09/02/2011 08:11 AM    BMP Latest Ref Rng & Units 12/23/2020 05/27/2020 11/24/2019  Glucose 65 - 99 mg/dL 125(H) 153(H) 135(H)  BUN 8 - 27 mg/dL 17 14 23   Creatinine 0.57 - 1.00 mg/dL 0.79 0.79 0.89  BUN/Creat Ratio 12 - 28 22 18 26   Sodium 134 - 144 mmol/L 142 142 142  Potassium 3.5 - 5.2 mmol/L 4.0 4.4 4.5  Chloride 96 - 106 mmol/L 103 104 104  CO2 20 - 29 mmol/L 28 26 24   Calcium 8.7 - 10.3 mg/dL 9.7 9.8 9.8    Current antihypertensive regimen:  Valsartan/HCTZ 80-12.5mg  daily  How often are you checking your Blood Pressure? Patient stated 3-5x per week.  Current home BP readings: Patient stated her blood pressure ranges around the 160-170's/78-90s.  What recent interventions/DTPs have been made by any provider to improve Blood Pressure control since last CPP Visit: None  Any recent hospitalizations or ED visits since last visit with CPP? Patient stated no.  What diet changes have been made to improve Blood Pressure Control?  Patient stated her diet hasn't changed much since the last time she spoke with Leata Mouse.  What exercise is being done to improve your Blood Pressure Control?  Patient stated she is taking a exercising class, She stated she is also outside  in the yard daily and doing things around the house.  Adherence Review: Is the patient currently on ACE/ARB medication? Valsartan-Hydrochlorothiazide 80-12.5 MG  Does the patient have >5 day gap between last estimated fill dates? Per misc rpts, no.  Star Rating Drugs:  Valsartan-Hydrochlorothiazide 80-12.5 MG 90 DS 12/06/20.  Follow-Up:Pharmacist Review  Patient stated her BP medication is still causing her to have a cough and running nose but she is able to manage it. She stated she is willing to increase her blood pressure if its available to see if there is a change in her blood pressure.    Charlann Lange,  RMA Clinical Pharmacist Assistant [phone]  10 minutes spent in review, coordination, and documentation.  Reviewed by: Beverly Milch, PharmD Clinical Pharmacist [phone]

## 2021-01-31 ENCOUNTER — Telehealth: Payer: Self-pay | Admitting: Pharmacist

## 2021-01-31 DIAGNOSIS — R21 Rash and other nonspecific skin eruption: Secondary | ICD-10-CM | POA: Diagnosis not present

## 2021-01-31 DIAGNOSIS — Z20828 Contact with and (suspected) exposure to other viral communicable diseases: Secondary | ICD-10-CM | POA: Diagnosis not present

## 2021-01-31 NOTE — Progress Notes (Addendum)
    Chronic Care Management Pharmacy Assistant   Name: Toni [name]  MRN: [id number] DOB: 12-19-1945  Reason for Encounter: Adherence Review  Medications: Outpatient Encounter Medications as of 01/31/2021  Medication Sig   aspirin 81 MG tablet Take 81 mg by mouth daily.   Calcium Carbonate-Vitamin D (CALCIUM + D PO) Take 1 tablet by mouth daily.    cetirizine (ZYRTEC) 10 MG tablet Take 10 mg by mouth as needed.    cholecalciferol (VITAMIN D) 1000 units tablet Take 1,000 Units by mouth daily.   ezetimibe (ZETIA) 10 MG tablet TAKE 1 TABLET BY MOUTH EVERY DAY   fenofibrate 54 MG tablet TAKE 1 TABLET BY MOUTH EVERY DAY FOR ELEVATED CHOLESTEROL   pantoprazole (PROTONIX) 40 MG tablet Take 1 tablet (40 mg total) by mouth daily. t   pneumococcal 23 valent vaccine (PNEUMOVAX 23) 25 MCG/0.5ML injection Inject 0.54m IM once   SYNTHROID 112 MCG tablet Take 1 tablet po QAM   valsartan-hydrochlorothiazide (DIOVAN HCT) 80-12.5 MG tablet Take 1 tablet by mouth daily.   Facility-Administered Encounter Medications as of 01/31/2021  Medication   0.9 %  sodium chloride infusion   Reviewed the patients chart for any medical/health and/or medication changes there were not any changes at this time.  Reviewed the patients star rating drugs, the patient medication refills is up to date.  Follow-Up:Pharmacist Review  VCharlann Lange RNicoma ParkPharmacist Assistant [phone]

## 2021-02-02 DIAGNOSIS — I10 Essential (primary) hypertension: Secondary | ICD-10-CM | POA: Diagnosis not present

## 2021-02-02 DIAGNOSIS — E782 Mixed hyperlipidemia: Secondary | ICD-10-CM | POA: Diagnosis not present

## 2021-02-02 DIAGNOSIS — E039 Hypothyroidism, unspecified: Secondary | ICD-10-CM | POA: Diagnosis not present

## 2021-02-05 ENCOUNTER — Telehealth: Payer: Self-pay

## 2021-02-05 NOTE — Telephone Encounter (Signed)
Left vm for 02/06/21 appointment screening-Toni

## 2021-02-06 ENCOUNTER — Other Ambulatory Visit: Payer: Self-pay

## 2021-02-06 ENCOUNTER — Encounter: Payer: Self-pay | Admitting: Nurse Practitioner

## 2021-02-06 ENCOUNTER — Ambulatory Visit (INDEPENDENT_AMBULATORY_CARE_PROVIDER_SITE_OTHER): Payer: PPO | Admitting: Nurse Practitioner

## 2021-02-06 VITALS — BP 140/78 | HR 69 | Temp 98.0°F | Resp 16 | Ht 66.0 in | Wt 165.4 lb

## 2021-02-06 DIAGNOSIS — R053 Chronic cough: Secondary | ICD-10-CM | POA: Diagnosis not present

## 2021-02-06 DIAGNOSIS — I10 Essential (primary) hypertension: Secondary | ICD-10-CM | POA: Diagnosis not present

## 2021-02-06 MED ORDER — FELODIPINE ER 5 MG PO TB24: 5.0000 mg | ORAL_TABLET | Freq: Every day | ORAL | 1 refills | Status: DC

## 2021-02-06 MED ORDER — HYDROCHLOROTHIAZIDE 25 MG PO TABS: 25.0000 mg | ORAL_TABLET | Freq: Every day | ORAL | 1 refills | Status: DC

## 2021-02-06 NOTE — Progress Notes (Signed)
[facility] [place], [place] [address]  Internal MEDICINE  Office Visit Note  Patient Name: Toni [name]  [id number]  FC:[id number]  Date of Service: 02/17/2021  Chief Complaint  Patient presents with   Follow-up    Bp Check, patient has a couch thinks is due to the Bp meds, patient feel that her BP med    HPI Toni [name] presents for a follow up visit for hypertension. She has a cough and thinks that the blood pressure medication could be causing the cough. She is taking valsartan-hydrochlorothiazide. She previously took an ACE inhibitor per patient report which also caused a cough. Due to this possible side effect, patient is interested in switching blood pressure medications which is a reasonable request because ACE inhibitors and ARBs can cause a dry nagging persistent cough.      Current Medication: Outpatient Encounter Medications as of 02/06/2021  Medication Sig   aspirin 81 MG tablet Take 81 mg by mouth daily.   Calcium Carbonate-Vitamin D (CALCIUM + D PO) Take 1 tablet by mouth daily.    cetirizine (ZYRTEC) 10 MG tablet Take 10 mg by mouth as needed.    cholecalciferol (VITAMIN D) 1000 units tablet Take 1,000 Units by mouth daily.   ezetimibe (ZETIA) 10 MG tablet TAKE 1 TABLET BY MOUTH EVERY DAY   felodipine (PLENDIL) 5 MG 24 hr tablet Take 1 tablet (5 mg total) by mouth daily.   fenofibrate 54 MG tablet TAKE 1 TABLET BY MOUTH EVERY DAY FOR ELEVATED CHOLESTEROL   hydrochlorothiazide (HYDRODIURIL) 25 MG tablet Take 1 tablet (25 mg total) by mouth daily.   pantoprazole (PROTONIX) 40 MG tablet Take 1 tablet (40 mg total) by mouth daily. t   pneumococcal 23 valent vaccine (PNEUMOVAX 23) 25 MCG/0.5ML injection Inject 0.5m IM once   SYNTHROID 112 MCG tablet Take 1 tablet po QAM   [DISCONTINUED] valsartan-hydrochlorothiazide (DIOVAN HCT) 80-12.5 MG tablet Take 1 tablet by mouth daily.   Facility-Administered Encounter Medications as of 02/06/2021  Medication    0.9 %  sodium chloride infusion    Surgical History: Past Surgical History:  Procedure Laterality Date   APPENDECTOM[facility]     [facility]   CATARACT EXTRACTION, BILATERAL Left 10/20/2017   CATARACT EXTRACTION, BILATERAL Right 10/27/2017   CHOLECYSTECTOMY     COLONOSCOPY     HAMMER TOE SURGERY  12/2012   left toe   PARTIAL HYSTERECTOMY     POLYPECTOMY     ROBOTIC ASSISTED SALPINGO OOPHERECTOMY     right ovary first removed then left ovary removed years later   TONSILLECTOMY  age 89  TOTAL THYROIDECTOMY  09/2011    Medical History: Past Medical History:  Diagnosis Date   Allergic rhinitis    Allergy    Aortic atherosclerosis (HCC)    Diabetes mellitus, type 2 (HCC)    no medicines- was pre diabtes- diet controlled    Diverticulitis    Essential hypertension    GERD (gastroesophageal reflux disease)    Hyperlipidemia    Hypertension    Hypothyroidism    Obesity    Osteoporosis    Transient insomnia    Tubular adenoma of colon 05/2013    Family History: Family History  Problem Relation Age of Onset   Throat cancer Father    Esophageal cancer Father    Colon cancer Maternal Aunt    Colon polyps Maternal  Aunt    Colon cancer Maternal Uncle    Colon polyps Maternal Uncle    Heart disease Maternal Uncle    Stomach cancer Maternal Uncle    Prostate cancer Maternal Uncle    Breast cancer Cousin    Diabetes Neg Hx    Kidney disease Neg Hx    Gallbladder disease Neg Hx    Rectal cancer Neg Hx     Social History   Socioeconomic History   Marital status: Married    Spouse name: Not on file   Number of children: 2   Years of education: Not on file   Highest education level: Not on file  Occupational History   Occupation: Retired  Tobacco Use   Smoking status: Former    Packs/day: 1.00    Years: 30.00    Pack years: 30.00    Types: Cigarettes    Quit date: 07/06/1998    Years since quitting:  22.6   Smokeless tobacco: Never  Substance and Sexual Activity   Alcohol use: No    Alcohol/week: 0.0 standard drinks   Drug use: No   Sexual activity: Not on file  Other Topics Concern   Not on file  Social History Narrative   Not on file   Social Determinants of Health   Financial Resource Strain: Low Risk    Difficulty of Paying Living Expenses: Not hard at all  Food Insecurity: Not on file  Transportation Needs: Not on file  Physical Activity: Not on file  Stress: Not on file  Social Connections: Not on file  Intimate Partner Violence: Not on file      Review of Systems  Constitutional:  Negative for chills, fatigue and unexpected weight change.  HENT:  Negative for congestion, rhinorrhea, sneezing and sore throat.   Eyes:  Negative for redness.  Respiratory:  Positive for cough (persistent, dry). Negative for chest tightness and shortness of breath.   Cardiovascular:  Negative for chest pain and palpitations.  Gastrointestinal:  Negative for abdominal pain, constipation, diarrhea, nausea and vomiting.  Genitourinary:  Negative for dysuria and frequency.  Musculoskeletal:  Negative for arthralgias, back pain, joint swelling and neck pain.  Skin:  Negative for rash.  Neurological: Negative.  Negative for tremors and numbness.  Hematological:  Negative for adenopathy. Does not bruise/bleed easily.  Psychiatric/Behavioral:  Negative for behavioral problems (Depression), sleep disturbance and suicidal ideas. The patient is not nervous/anxious.    Vital Signs: BP 140/78   Pulse 69   Temp 98 F (36.7 C)   Resp 16   Ht '5\' 6"'$  (1.676 m)   Wt 165 lb 6.4 oz (75 kg)   SpO2 98%   BMI 26.70 kg/m    Physical Exam Vitals reviewed.  Constitutional:      General: She is not in acute distress.    Appearance: Normal appearance. She is not ill-appearing.  HENT:     Head: Normocephalic and atraumatic.  Cardiovascular:     Rate and Rhythm: Normal rate and regular rhythm.      Pulses: Normal pulses.     Heart sounds: Normal heart sounds. No murmur heard. Pulmonary:     Effort: Pulmonary effort is normal. No respiratory distress.     Breath sounds: Normal breath sounds.     Comments: Dry cough observed Skin:    General: Skin is warm and dry.     Capillary Refill: Capillary refill takes less than 2 seconds.  Neurological:     Mental Status: She  is alert and oriented to person, place, and time.  Psychiatric:        Mood and Affect: Mood normal.        Behavior: Behavior normal.     Assessment/Plan: 1. Essential hypertension Valsartan-hydrochlorothiazide discontinued. Felodipine and hydrochlorothiazide prescribed separately. Follow up in 4 weeks. - felodipine (PLENDIL) 5 MG 24 hr tablet; Take 1 tablet (5 mg total) by mouth daily.  Dispense: 30 tablet; Refill: 1 - hydrochlorothiazide (HYDRODIURIL) 25 MG tablet; Take 1 tablet (25 mg total) by mouth daily.  Dispense: 90 tablet; Refill: 1  2. Persistent dry cough No signs of infection, blood pressure medication changes, cough may take a few weeks to clear. If cough still persists after >1 month, will pursue other possible causes.    General Counseling: melah schupbach understanding of the findings of todays visit and agrees with plan of treatment. I have discussed any further diagnostic evaluation that may be needed or ordered today. We also reviewed her medications today. she has been encouraged to call the office with any questions or concerns that should arise related to todays visit.    No orders of the defined types were placed in this encounter.   Meds ordered this encounter  Medications   felodipine (PLENDIL) 5 MG 24 hr tablet    Sig: Take 1 tablet (5 mg total) by mouth daily.    Dispense:  30 tablet    Refill:  1   hydrochlorothiazide (HYDRODIURIL) 25 MG tablet    Sig: Take 1 tablet (25 mg total) by mouth daily.    Dispense:  90 tablet    Refill:  1    Return in about 4 weeks (around  03/06/2021) for F/U, BP check, Kadience Macchi PCP.   Total time spent:20 Minutes Time spent includes review of chart, medications, test results, and follow up plan with the patient.   [place] Controlled Substance Database was reviewed by me.  This patient was seen by [name], FNP-C in collaboration with Dr. Clayborn Bigness as a part of collaborative care agreement.   Sriyan Cutting R. Valetta Fuller, MSN, FNP-C Internal medicine

## 2021-02-28 ENCOUNTER — Other Ambulatory Visit: Payer: Self-pay | Admitting: Nurse Practitioner

## 2021-02-28 DIAGNOSIS — I10 Essential (primary) hypertension: Secondary | ICD-10-CM

## 2021-03-07 ENCOUNTER — Ambulatory Visit (INDEPENDENT_AMBULATORY_CARE_PROVIDER_SITE_OTHER): Payer: PPO | Admitting: Nurse Practitioner

## 2021-03-07 ENCOUNTER — Other Ambulatory Visit: Payer: Self-pay

## 2021-03-07 ENCOUNTER — Encounter: Payer: Self-pay | Admitting: Nurse Practitioner

## 2021-03-07 VITALS — BP 130/72 | HR 70 | Temp 98.2°F | Resp 16 | Ht 66.5 in | Wt 164.2 lb

## 2021-03-07 DIAGNOSIS — I10 Essential (primary) hypertension: Secondary | ICD-10-CM | POA: Diagnosis not present

## 2021-03-07 DIAGNOSIS — R053 Chronic cough: Secondary | ICD-10-CM | POA: Diagnosis not present

## 2021-03-07 DIAGNOSIS — Z23 Encounter for immunization: Secondary | ICD-10-CM | POA: Diagnosis not present

## 2021-03-07 DIAGNOSIS — E039 Hypothyroidism, unspecified: Secondary | ICD-10-CM | POA: Diagnosis not present

## 2021-03-07 DIAGNOSIS — E119 Type 2 diabetes mellitus without complications: Secondary | ICD-10-CM

## 2021-03-07 LAB — POCT GLYCOSYLATED HEMOGLOBIN (HGB A1C): Hemoglobin A1C: 6.6 % — AB (ref 4.0–5.6)

## 2021-03-07 MED ORDER — TETANUS-DIPHTH-ACELL PERTUSSIS 5-2.5-18.5 LF-MCG/0.5 IM SUSP: 0.5000 mL | Freq: Once | INTRAMUSCULAR | 0 refills | Status: AC

## 2021-03-07 MED ORDER — FELODIPINE ER 10 MG PO TB24: 10.0000 mg | ORAL_TABLET | Freq: Every day | ORAL | 1 refills | Status: DC

## 2021-03-07 NOTE — Progress Notes (Signed)
[facility] [place], [place] [address]  Internal MEDICINE  Office Visit Note  Patient Name: Toni [name]  [id number]  FC:[id number]  Date of Service: 03/07/2021  Chief Complaint  Patient presents with   Follow-up    BP check   Diabetes   Gastroesophageal Reflux   Hyperlipidemia   Hypertension    HPI Toni [name] presents for follow up visit for hypertension and diabetes. At her previous office visit, the valsartan was stopped due to dry nagging cough which she also had when taking an ACE inhibitor. She was started on felodipine which she is tolerating well but her blood pressure is still not optimally controlled.  --A1C checked today and it is 6.6 which is up from 5.9 in June 2022. She reports that she had gone to urgent care for poison ivy and was given a course of prednisone to take which shoots her glucose levels up. She is also leaving to go [facility] [place] this weekend.    Current Medication: Outpatient Encounter Medications as of 03/07/2021  Medication Sig   aspirin 81 MG tablet Take 81 mg by mouth daily.   Calcium Carbonate-Vitamin D (CALCIUM + D PO) Take 1 tablet by mouth daily.    cetirizine (ZYRTEC) 10 MG tablet Take 10 mg by mouth as needed.    cholecalciferol (VITAMIN D) 1000 units tablet Take 1,000 Units by mouth daily.   ezetimibe (ZETIA) 10 MG tablet TAKE 1 TABLET BY MOUTH EVERY DAY   felodipine (PLENDIL) 10 MG 24 hr tablet Take 1 tablet (10 mg total) by mouth daily.   fenofibrate 54 MG tablet TAKE 1 TABLET BY MOUTH EVERY DAY FOR ELEVATED CHOLESTEROL   hydrochlorothiazide (HYDRODIURIL) 25 MG tablet Take 1 tablet (25 mg total) by mouth daily.   pantoprazole (PROTONIX) 40 MG tablet Take 1 tablet (40 mg total) by mouth daily. t   SYNTHROID 112 MCG tablet Take 1 tablet po QAM   [DISCONTINUED] felodipine (PLENDIL) 5 MG 24 hr tablet TAKE 1 TABLET (5 MG TOTAL) BY MOUTH DAILY.   [DISCONTINUED] Tdap (BOOSTRIX) 5-2.5-18.5 LF-MCG/0.5 injection  Inject 0.5 mLs into the muscle once.   pneumococcal 23 valent vaccine (PNEUMOVAX 23) 25 MCG/0.5ML injection Inject 0.83m IM once   Tdap (BOOSTRIX) 5-2.5-18.5 LF-MCG/0.5 injection Inject 0.5 mLs into the muscle once for 1 dose.   Facility-Administered Encounter Medications as of 03/07/2021  Medication   0.9 %  sodium chloride infusion    Surgical History: Past Surgical History:  Procedure Laterality Date   APPENDECT[facility]     [facility]   CATARACT EXTRACTION, BILATERAL Left 10/20/2017   CATARACT EXTRACTION, BILATERAL Right 10/27/2017   CHOLECYSTECTOMY     COLONOSCOPY     HAMMER TOE SURGERY  12/2012   left toe   PARTIAL HYSTERECTOMY     POLYPECTOMY     ROBOTIC ASSISTED SALPINGO OOPHERECTOMY     right ovary first removed then left ovary removed years later   TONSILLECTOMY  age 75  TOTAL THYROIDECTOMY  09/2011    Medical History: Past Medical History:  Diagnosis Date   Allergic rhinitis    Allergy    Aortic atherosclerosis (HCC)    Diabetes mellitus, type 2 (HCC)    no medicines- was pre diabtes- diet controlled    Diverticulitis    Essential hypertension    GERD (gastroesophageal reflux disease)    Hyperlipidemia  Hypertension    Hypothyroidism    Obesity    Osteoporosis    Transient insomnia    Tubular adenoma of colon 05/2013    Family History: Family History  Problem Relation Age of Onset   Throat cancer Father    Esophageal cancer Father    Colon cancer Maternal Aunt    Colon polyps Maternal Aunt    Colon cancer Maternal Uncle    Colon polyps Maternal Uncle    Heart disease Maternal Uncle    Stomach cancer Maternal Uncle    Prostate cancer Maternal Uncle    Breast cancer Cousin    Diabetes Neg Hx    Kidney disease Neg Hx    Gallbladder disease Neg Hx    Rectal cancer Neg Hx     Social History   Socioeconomic History   Marital status: Married    Spouse name: Not on file   Number  of children: 2   Years of education: Not on file   Highest education level: Not on file  Occupational History   Occupation: Retired  Tobacco Use   Smoking status: Former    Packs/day: 1.00    Years: 30.00    Pack years: 30.00    Types: Cigarettes    Quit date: 07/06/1998    Years since quitting: 22.6   Smokeless tobacco: Never  Substance and Sexual Activity   Alcohol use: No    Alcohol/week: 0.0 standard drinks   Drug use: No   Sexual activity: Not on file  Other Topics Concern   Not on file  Social History Narrative   Not on file   Social Determinants of Health   Financial Resource Strain: Low Risk    Difficulty of Paying Living Expenses: Not hard at all  Food Insecurity: Not on file  Transportation Needs: Not on file  Physical Activity: Not on file  Stress: Not on file  Social Connections: Not on file  Intimate Partner Violence: Not on file      Review of Systems  Constitutional:  Negative for chills, fatigue and unexpected weight change.  HENT:  Positive for trouble swallowing (minimal, has trouble with pills sometimes. bothersome but not a significant problem.). Negative for congestion, rhinorrhea, sneezing and sore throat.   Eyes:  Negative for redness.  Respiratory:  Negative for cough, chest tightness and shortness of breath.   Cardiovascular:  Negative for chest pain and palpitations.  Gastrointestinal:  Negative for abdominal pain, constipation, diarrhea, nausea and vomiting.  Genitourinary:  Negative for dysuria and frequency.  Musculoskeletal:  Negative for arthralgias, back pain, joint swelling and neck pain.  Skin:  Negative for rash.  Neurological: Negative.  Negative for tremors and numbness.  Hematological:  Negative for adenopathy. Does not bruise/bleed easily.  Psychiatric/Behavioral:  Negative for behavioral problems (Depression), sleep disturbance and suicidal ideas. The patient is not nervous/anxious.    Vital Signs: BP 130/72 Comment: 158/82   Pulse 70   Temp 98.2 F (36.8 C)   Resp 16   Ht 5' 6.5" (1.689 m)   Wt 164 lb 3.2 oz (74.5 kg)   SpO2 98%   BMI 26.11 kg/m    Physical Exam Vitals reviewed.  Constitutional:      General: She is not in acute distress.    Appearance: Normal appearance. She is not ill-appearing.  HENT:     Head: Normocephalic and atraumatic.  Eyes:     Extraocular Movements: Extraocular movements intact.     Pupils: Pupils are equal,  round, and reactive to light.  Cardiovascular:     Rate and Rhythm: Normal rate and regular rhythm.  Pulmonary:     Effort: Pulmonary effort is normal. No respiratory distress.  Neurological:     Mental Status: She is alert and oriented to person, place, and time.  Psychiatric:        Mood and Affect: Mood normal.        Behavior: Behavior normal.    Assessment/Plan: 1. Essential hypertension Increased felodipine dose to 10 mg daily. Patient requested starting with 1.5 tabs of the 5 mg tablet since she still has some so that she can work up to the 10 mg dose, this is fine, patient instructed to take 7.5 mg daily for 1 week and then she can start the 10 mg dose.  - felodipine (PLENDIL) 10 MG 24 hr tablet; Take 1 tablet (10 mg total) by mouth daily.  Dispense: 90 tablet; Refill: 1  2. Diet-controlled type 2 diabetes mellitus ([place]) Reviewed diet and lifestyle modifications with patient, She is aware of what she needs to improve is working on this. She also completed the prednisone course that she was taking for a poison ivy rash.  - POCT HgB A1C  3. Acquired hypothyroidism Feels like pills get stuck in throat. Bothers her some but not enough to want to do anything about it now. Recommended ultrasound of the soft tissue of the neck first and patient stated she will call the clinic if she decides that she would like the ultrasound done.   4. Persistent dry cough Resolved since valsartan was stopped.   5. Need for tetanus booster - Tdap (BOOSTRIX) 5-2.5-18.5  LF-MCG/0.5 injection; Inject 0.5 mLs into the muscle once for 1 dose.  Dispense: 0.5 mL; Refill: 0   General Counseling: Toni [name] understanding of the findings of todays visit and agrees with plan of treatment. I have discussed any further diagnostic evaluation that may be needed or ordered today. We also reviewed her medications today. she has been encouraged to call the office with any questions or concerns that should arise related to todays visit.    Orders Placed This Encounter  Procedures   POCT HgB A1C    Meds ordered this encounter  Medications   Tdap (BOOSTRIX) 5-2.5-18.5 LF-MCG/0.5 injection    Sig: Inject 0.5 mLs into the muscle once for 1 dose.    Dispense:  0.5 mL    Refill:  0   felodipine (PLENDIL) 10 MG 24 hr tablet    Sig: Take 1 tablet (10 mg total) by mouth daily.    Dispense:  90 tablet    Refill:  1    Return in 10 weeks (on 05/16/2021) for previously scheduled, CPE, Toni [name] PCP. Patient instructed to check blood pressure daily and call clinic if her blood pressure is low and she has to go back to the 5 mg dose.    Total time spent:30 Minutes Time spent includes review of chart, medications, test results, and follow up plan with the patient.   [place] Controlled Substance Database was reviewed by me.  This patient was seen by [name], FNP-C in collaboration with Dr. Clayborn Bigness as a part of collaborative care agreement.   Waddell Iten R. Valetta Fuller, MSN, FNP-C Internal medicine

## 2021-03-23 ENCOUNTER — Other Ambulatory Visit: Payer: Self-pay | Admitting: Nurse Practitioner

## 2021-03-23 DIAGNOSIS — I10 Essential (primary) hypertension: Secondary | ICD-10-CM

## 2021-03-27 NOTE — Progress Notes (Signed)
Chronic Care Management Pharmacy Note  03/28/2021 Name:  Toni [name] MRN:  [id number] DOB:  [birth year]  Summary: FU visit.  BP continues to be elevated on full dose Felodipine and HCTZ.  She does not have any symptoms.  Higher readings in morning before taking meds.  Recommendations/Changes made from today's visit: Asked her to take her med in PM starting now for two weeks then assess. Consider recheck of TSH and other routine labs.  Thyroid could be contributing to elevated BP and lipids.  Recommend recheck and evaluate next steps.   Plan: FU on BP 2 weeks FU phone with pharmD in 3 months   Subjective: Toni [name] is an 75 y.o. year old female who is a primary patient of Humphrey Rolls, Timoteo Gaul, MD.  The CCM team was consulted for assistance with disease management and care coordination needs.    Engaged with patient face to face for initial visit in response to provider referral for pharmacy case management and/or care coordination services.   Consent to Services:  The patient was given the following information about Chronic Care Management services today, agreed to services, and gave verbal consent: 1. CCM service includes personalized support from designated clinical staff supervised by the primary care provider, including individualized plan of care and coordination with other care providers 2. 24/7 contact phone numbers for assistance for urgent and routine care needs. 3. Service will only be billed when office clinical staff spend 20 minutes or more in a month to coordinate care. 4. Only one practitioner may furnish and bill the service in a calendar month. 5.The patient may stop CCM services at any time (effective at the end of the month) by phone call to the office staff. 6. The patient will be responsible for cost sharing (co-pay) of up to 20% of the service fee (after annual deductible is met). Patient agreed to services and consent obtained.  Patient Care Team: Lavera Guise, MD as PCP - General (Internal Medicine) Edythe Clarity, [name] as Pharmacist (Pharmacist)  Recent office visits:  03/07/21 Toni [name] - felodipine dose increased again as BP uncontrolled starting with 7.24m and titrating up to 131mdaily 02/06/21 AbJonetta [name] started on felodipine and HCTZ due to persistent cough with Valsartan/HCTZ 12/17/20 AbJonetta OsgoodNP. For follow-up. No medication changes. 09/10/20 HaLuiz OchoaNP. For follow-up. STARTED Valsartan-Hydrochlorothiazide 80-12.5 mg daily. STOPPED Hydrochlorothiazide, Olmesartan. 08/28/20 HaLuiz OchoaNP. For follow-up. STARTED Azithromycin 250 mg daily for 4 days and Prednisone 10 mg pack. STOPPED Clarithromycin.   Recent consult visits:  None in the last six months.   Hospital visits:  None in previous 6 months   Medication History: Valsartan-Hydrochlorothiazide 80-12.5 mg 90 DS 12/06/20     Objective:  Lab Results  Component Value Date   CREATININE 0.79 12/23/2020   BUN 17 12/23/2020   GFRNONAA 75 05/27/2020   GFRAA 86 05/27/2020   NA 142 12/23/2020   K 4.0 12/23/2020   CALCIUM 9.7 12/23/2020   CO2 28 12/23/2020   GLUCOSE 125 (H) 12/23/2020    Lab Results  Component Value Date/Time   HGBA1C 6.6 (A) 03/07/2021 08:36 AM   HGBA1C 5.9 (A) 12/17/2020 12:15 PM   HGBA1C 6.1 (H) 05/12/2019 08:11 AM   HGBA1C 5.9 (H) 04/28/2018 08:48 AM    Last diabetic Eye exam: No results found for: HMDIABEYEEXA  Last diabetic Foot exam: No results found for: HMDIABFOOTEX   Lab Results  Component Value Date   CHOL  286 (H) 12/23/2020   HDL 52 12/23/2020   LDLCALC 189 (H) 12/23/2020   TRIG 233 (H) 12/23/2020   CHOLHDL 5.5 (H) 12/23/2020    Hepatic Function Latest Ref Rng & Units 12/23/2020 05/27/2020 11/24/2019  Total Protein 6.0 - 8.5 g/dL 6.6 6.4 6.6  Albumin 3.7 - 4.7 g/dL 4.5 4.3 4.4  AST 0 - 40 IU/L _0 ALT 0 - 32 IU/L 26 28 32  Alk Phosphatase 44 - 121 IU/L 53 57 54  Total Bilirubin 0.0 -  1.2 mg/dL 1.0 0.6 1.1    Lab Results  Component Value Date/Time   TSH 0.434 (L) 12/23/2020 07:07 AM   TSH 0.358 (L) 05/27/2020 09:20 AM   FREET4 1.58 12/23/2020 07:07 AM   FREET4 1.59 05/27/2020 09:20 AM    CBC Latest Ref Rng & Units 12/23/2020 05/27/2020 05/12/2019  WBC 3.4 - 10.8 x10E3/uL 5.3 4.2 8.7  Hemoglobin 11.1 - 15.9 g/dL 15.6 15.1 15.5  Hematocrit 34.0 - 46.6 % 46.4 44.3 45.1  Platelets 150 - 450 x10E3/uL 183 221 278    Lab Results  Component Value Date/Time   VD25OH 30.9 12/23/2020 07:07 AM   VD25OH 22.5 (L) 05/27/2020 09:20 AM    Clinical ASCVD: No  The 10-year ASCVD risk score (Arnett DK, et al., 2019) is: 33.2%   Values used to calculate the score:     Age: 75 years     Sex: Female     Is Non-Hispanic African American: No     Diabetic: Yes     Tobacco smoker: No     Systolic Blood Pressure: 299 mmHg     Is BP treated: Yes     HDL Cholesterol: 52 mg/dL     Total Cholesterol: 286 mg/dL    Depression screen [facility] 2/9 02/06/2021 09/10/2020 05/13/2020  Decreased Interest 0 0 0  Down, Depressed, Hopeless 0 0 0  PHQ - 2 Score 0 0 0      Social History   Tobacco Use  Smoking Status Former   Packs/day: 1.00   Years: 30.00   Pack years: 30.00   Types: Cigarettes   Quit date: 07/06/1998   Years since quitting: 22.7  Smokeless Tobacco Never   BP Readings from Last 3 Encounters:  03/07/21 130/72  02/06/21 140/78  12/17/20 (!) 141/77   Pulse Readings from Last 3 Encounters:  03/07/21 70  02/06/21 69  12/17/20 71   Wt Readings from Last 3 Encounters:  03/07/21 164 lb 3.2 oz (74.5 kg)  02/06/21 165 lb 6.4 oz (75 kg)  12/17/20 164 lb 3.2 oz (74.5 kg)   BMI Readings from Last 3 Encounters:  03/07/21 26.11 kg/m  02/06/21 26.70 kg/m  12/17/20 26.50 kg/m    Assessment/Interventions: Review of patient past medical history, allergies, medications, health status, including review of consultants reports, laboratory and other test data, was performed as part  of comprehensive evaluation and provision of chronic care management services.   SDOH:  (Social Determinants of Health) assessments and interventions performed: Yes  Financial Resource Strain: Low Risk    Difficulty of Paying Living Expenses: Not hard at all    SDOH Screenings   Alcohol Screen: Not on file  Depression (PHQ2-9): Low Risk    PHQ-2 Score: 0  Financial Resource Strain: Low Risk    Difficulty of Paying Living Expenses: Not hard at all  Food Insecurity: Not on file  Housing: Not on file  Physical Activity: Not on file  Social Connections: Not  on file  Stress: Not on file  Tobacco Use: Medium Risk   Smoking Tobacco Use: Former   Smokeless Tobacco Use: Never  Transportation Needs: Not on file    CCM Care Plan  Allergies  Allergen Reactions   Cefaclor     REACTION: swelling/hives   Penicillins     REACTION: swelling/hives   Talwin [Pentazocine]     Medications Reviewed Today     Reviewed by Edythe Clarity, [facility] (Pharmacist) on 03/28/21 at [place] List Status: <None>   Medication Order Taking? Sig Documenting Provider Last Dose Status Informant  0.9 %  sodium chloride infusion [id number]   Ladene Artist, MD  Active   aspirin 81 MG tablet [id number] Yes Take 81 mg by mouth daily. [provider] Taking Active Self  Calcium Carbonate-Vitamin D (CALCIUM + D PO) [id number] Yes Take 1 tablet by mouth daily.  [provider] Taking Active Self  cetirizine (ZYRTEC) 10 MG tablet [id number] Yes Take 10 mg by mouth as needed.  [provider] Taking Active   cholecalciferol (VITAMIN D) 1000 units tablet [id number] Yes Take 1,000 Units by mouth daily. [provider] Taking Active   ezetimibe (ZETIA) 10 MG tablet [id number] Yes TAKE 1 TABLET BY MOUTH EVERY DAY Lavera Guise, MD Taking Active   felodipine (PLENDIL) 10 MG 24 hr tablet [id number] Yes Take 1 tablet (10 mg total) by mouth daily. Toni Osgood, NP Taking Active    fenofibrate 54 MG tablet [id number] Yes TAKE 1 TABLET BY MOUTH EVERY DAY FOR ELEVATED CHOLESTEROL Lavera Guise, MD Taking Active   hydrochlorothiazide (HYDRODIURIL) 25 MG tablet [id number] Yes Take 1 tablet (25 mg total) by mouth daily. Toni Osgood, NP Taking Active   pantoprazole (PROTONIX) 40 MG tablet [id number] Yes Take 1 tablet (40 mg total) by mouth daily. t Lavera Guise, MD Taking Active   pneumococcal 23 valent vaccine (PNEUMOVAX 23) 25 MCG/0.5ML injection [id number] Yes Inject 0.74m IM once BRonnell Freshwater NP Taking Active   SYNTHROID 112 MCG tablet [id number] Take 1 tablet po QAM KLavera Guise MD Taking Active             Patient Active Problem List   Diagnosis Date Noted   Needs flu shot 06/02/2020   Need for vaccination against Streptococcus pneumoniae using pneumococcal conjugate vaccine 13 06/02/2020   Pre-diabetes 11/25/2019   Lumbar back pain with radiculopathy affecting lower extremity 11/25/2019   Left sided sciatica 05/24/2019   Encounter for general adult medical examination with abnormal findings 05/02/2018   Cough 05/02/2018   Hypothyroidism 01/14/2018   Urinary tract infection with hematuria 01/14/2018   Dysuria 01/14/2018   Acute recurrent pansinusitis 01/14/2018   Atypical chest pain 12/13/2014   Aortic atherosclerosis (HJustin    Mixed hyperlipidemia    Essential hypertension    Diverticulitis of colon without hemorrhage 11/13/2014    Immunization History  Administered Date(s) Administered   Influenza Inj Mdck Quad Pf 03/23/2019, 05/13/2020   Influenza, High Dose Seasonal PF 04/09/2017, 07/30/2018, 03/23/2019   Influenza-Unspecified 07/30/2018   PFIZER(Purple Top)SARS-COV-2 Vaccination 09/19/2019, 10/17/2019, 06/04/2020   Pneumococcal Conjugate-13 04/09/2017   Pneumococcal Polysaccharide-23 05/13/2020   Tdap 03/12/2021   Zoster Recombinat (Shingrix) 03/23/2019, 05/28/2019    Conditions to be addressed/monitored:  HTN, Hypothyroidism,  HLD, Back Pain, Pre-DM  Care Plan : General Pharmacy (Adult)  Updates made by DEdythe Clarity RPH since 03/28/2021 12:00 AM     Problem: HTN, Hypothyroidism, HLD, Back Pain,  Pre-DM   Priority: High  Onset Date: 12/24/2020  Note:   Current Barriers:  Unable to achieve control of cholesterol   Pharmacist Clinical Goal(s):  Patient will achieve adherence to monitoring guidelines and medication adherence to achieve therapeutic efficacy achieve control of cholesterol as evidenced by lipid panel adhere to plan to optimize therapeutic regimen for lipids as evidenced by report of adherence to recommended medication management changes contact provider office for questions/concerns as evidenced notation of same in electronic health record through collaboration with PharmD and provider.   Interventions: 1:1 collaboration with Lavera Guise, MD regarding development and update of comprehensive plan of care as evidenced by provider attestation and co-signature Inter-disciplinary care team collaboration (see longitudinal plan of care) Comprehensive medication review performed; medication list updated in electronic medical record  Hypertension (BP goal <140/90) -Controlled -Current treatment: Valsartan/HCTZ 80-12.84m daily -Medications previously tried: olmesartan, diltiazem  -Current home readings: 141/71, no logs today -Current dietary habits: see DM -Current exercise habits: very active mowing lawn, swims in backyard pool, walking -Denies hypotensive/hypertensive symptoms -Educated on BP goals and benefits of medications for prevention of heart attack, stroke and kidney damage; Exercise goal of 150 minutes per week; Importance of home blood pressure monitoring; Symptoms of hypotension and importance of maintaining adequate hydration; -Counseled to monitor BP at home 3 times weekly, document, and provide log at future appointments -Recommended to continue current medication -Will f/u on  BP readings next month.  Hyperlipidemia: (LDL goal < 100) -Uncontrolled -Current treatment: Zetia 163mFenofibrate 5454m-Medications previously tried: ALL STATINS (elevated LFTs and cramping)  -Current dietary patterns: see DM -Current exercise habits: see above -Educated on Cholesterol goals;  Importance of limiting foods high in cholesterol; Exercise goal of 150 minutes per week; -She has tried all statins and had elevated LFTs, she has also tried Repatha but recalls high copay.  This was some time ago.  Most recent lipid panel showed increased LDL and TG's.  However, she has not been on Fenofibrate for about one month due to communication issue with refills.  This explains her elevated TG.  LDL continues to increase despite Zetia.  Only option at this point would be to try Repatha or Praluent.  Will reach out to insurance to see which one is covered -Counseled on diet and exercise extensively Recommended to continue current medication Will consult with PCP and review best potential injectable option for patient.  Pre-Diabetes (A1c goal <6.5%) -Controlled -Current medications: None currently -Medications previously tried: none  -Current home glucose readings fasting glucose: not checking post prandial glucose: not checking -Denies hypoglycemic/hyperglycemic symptoms -Current meal patterns:  breakfast: oatmeal, cheerios, occasional eggs lunch: lots of vegetables  dinner:  snacks:  drinks: water -Current exercise: see above -Educated on A1c and blood sugar goals; Exercise goal of 150 minutes per week; Congratulated on recent improvements with A1c -Counseled to check feet daily and get yearly eye exams -Recommended continue current management strategyies  Hypothyroidism (Goal: Maintain TSH) -Not ideally controlled -Current treatment  Synthroid 112m34maily - except half tablet on Wed and Sunday -Medications previously tried: none noted  -Most recent TSH still slightly  below goal.  She reports that she feels tired.  Counseled on proper administration of medication 30 minutes before food or other meds on empty stomach.  I feel it is best she continues as is for now since it is so close to goal.  Will continue to monitor TSH. -Recommended to continue current medication  Patient Goals/Self-Care Activities Patient  will:  - take medications as prescribed focus on medication adherence by pill box check blood pressure 3 times per week, document, and provide at future appointments  Follow Up Plan: The care management team will reach out to the patient again over the next 30 days.      Goal: Patient-Specific Goal   Note:   Current Barriers:  Unable to achieve control of cholesterol   Pharmacist Clinical Goal(s):  Patient will achieve adherence to monitoring guidelines and medication adherence to achieve therapeutic efficacy achieve control of cholesterol as evidenced by lipid panel adhere to plan to optimize therapeutic regimen for lipids as evidenced by report of adherence to recommended medication management changes contact provider office for questions/concerns as evidenced notation of same in electronic health record through collaboration with PharmD and provider.   Interventions: 1:1 collaboration with Lavera Guise, MD regarding development and update of comprehensive plan of care as evidenced by provider attestation and co-signature Inter-disciplinary care team collaboration (see longitudinal plan of care) Comprehensive medication review performed; medication list updated in electronic medical record  Hypertension (BP goal <140/90) -Controlled -Current treatment: Felodipine 59m 24hr daily HCTZ 267mdaily -Medications previously tried: olmesartan, diltiazem  -Current home readings: 141/71, no logs today -Current dietary habits: see DM -Current exercise habits: very active mowing lawn, swims in backyard pool, walking -Denies  hypotensive/hypertensive symptoms -Educated on BP goals and benefits of medications for prevention of heart attack, stroke and kidney damage; Exercise goal of 150 minutes per week; Importance of home blood pressure monitoring; Symptoms of hypotension and importance of maintaining adequate hydration; -Counseled to monitor BP at home 3 times weekly, document, and provide log at future appointments -Recommended to continue current medication -Will f/u on BP readings next month.  Update 03/28/21 148/75 P 76, 187/95 P 72 (this morning reading) She is up to the full tablet of Felodipine and BP still uncontrolled.  She is taking her meds in the morning and most of her higher BP readings are in the morning right before she takes the next dose.  Have asked her to try taking the Felodipine at night for about two weeks, will follow up with her on if this affects BP.  In the mean time we may want to get labs to see if thyroid is affecting BP.  Hyperlipidemia: (LDL goal < 100) -Uncontrolled -Current treatment: Zetia 1052menofibrate 52m50mMedications previously tried: ALL STATINS (elevated LFTs and cramping)  -Current dietary patterns: see DM -Current exercise habits: see above -Educated on Cholesterol goals;  Importance of limiting foods high in cholesterol; Exercise goal of 150 minutes per week; -She has tried all statins and had elevated LFTs, she has also tried Repatha but recalls high copay.  This was some time ago.  Most recent lipid panel showed increased LDL and TG's.  However, she has not been on Fenofibrate for about one month due to communication issue with refills.  This explains her elevated TG.  LDL continues to increase despite Zetia.  Only option at this point would be to try Repatha or Praluent.  Will reach out to insurance to see which one is covered -Counseled on diet and exercise extensively Recommended to continue current medication Will consult with PCP and review best potential  injectable option for patient.  Update 03/28/21 Back on her fenofibrate, patient needs new labs.  She declines any injectables at this time due to cost.  She does not qualify for any PAP programs, will consider [employer] next year once grant  opens back up.  Recommend recheck lipid panel.  Pre-Diabetes (A1c goal <6.5%) -Controlled -Current medications: None currently -Medications previously tried: none  -Current home glucose readings fasting glucose: not checking post prandial glucose: not checking -Denies hypoglycemic/hyperglycemic symptoms -Current meal patterns:  breakfast: oatmeal, cheerios, occasional eggs lunch: lots of vegetables  dinner:  snacks:  drinks: water -Current exercise: see above -Educated on A1c and blood sugar goals; Exercise goal of 150 minutes per week; Congratulated on recent improvements with A1c -Counseled to check feet daily and get yearly eye exams -Recommended continue current management strategyies  Hypothyroidism (Goal: Maintain TSH) -Not ideally controlled -Current treatment  Synthroid 121mg daily - except half tablet on Wed and Sunday -Medications previously tried: none noted  -Most recent TSH still slightly below goal.  She reports that she feels tired.  Counseled on proper administration of medication 30 minutes before food or other meds on empty stomach.  I feel it is best she continues as is for now since it is so close to goal.  Will continue to monitor TSH. -Recommended to continue current medication  Update 03/28/21 Continues same dose as last visit.  Reports she does not feel tired as much any more.  Is now taking med in the morning on empty stomach.  She needs new thyroid labs as she has not had them checked since it was slightly low in June.  Would recommend recheck.  Low TSH could be causing some of her elevated BP and elevated lipids.  Recommend recheck labs and correct thyroid replacement if needed.  Patient Goals/Self-Care  Activities Patient will:  - take medications as prescribed focus on medication adherence by pill box check blood pressure 3 times per week, document, and provide at future appointments  Follow Up Plan: The care management team will reach out to the patient again over the next 30 days.            Medication Assistance: None required.  Patient affirms current coverage meets needs.  Compliance/Adherence/Medication fill history: Care Gaps: Tdap  Star-Rating Drugs: Valsartan-Hydrochlorothiazide 80-12.5 mg 90 DS 12/06/20  Patient's preferred pharmacy is:  [id number] [id number]: [phone]: [phone] pill box? Yes Pt endorses 100% compliance  We discussed: Benefits of medication synchronization, packaging and delivery as well as enhanced pharmacist oversight with [employer]. Patient decided to: Continue current medication management strategy  Care Plan and Follow Up Patient Decision:  Patient agrees to Care Plan and Follow-up.  Plan: The care management team will reach out to the patient again over the next 30 days.  ChBeverly MilchPharmD Clinical Pharmacist NoUniversity Hospital- St[facility] [phone]

## 2021-03-28 ENCOUNTER — Ambulatory Visit: Payer: PPO | Admitting: Pharmacist

## 2021-03-28 ENCOUNTER — Telehealth: Payer: Self-pay

## 2021-03-28 DIAGNOSIS — E782 Mixed hyperlipidemia: Secondary | ICD-10-CM

## 2021-03-28 DIAGNOSIS — E039 Hypothyroidism, unspecified: Secondary | ICD-10-CM

## 2021-03-28 DIAGNOSIS — I10 Essential (primary) hypertension: Secondary | ICD-10-CM

## 2021-03-28 NOTE — Patient Instructions (Addendum)
Visit Information   Goals Addressed             This Visit's Progress    Track and Manage My Blood Pressure-Hypertension   On track    Timeframe:  Long-Range Goal Priority:  High Start Date:  12/24/20                           Expected End Date:  12/21/212                     Follow Up Date 04/04/21    - check blood pressure 3 times per week - choose a place to take my blood pressure (home, clinic or office, retail store) - write blood pressure results in a log or diary    Why is this important?   You won't feel high blood pressure, but it can still hurt your blood vessels.  High blood pressure can cause heart or kidney problems. It can also cause a stroke.  Making lifestyle changes like losing a little weight or eating less salt will help.  Checking your blood pressure at home and at different times of the day can help to control blood pressure.  If the doctor prescribes medicine remember to take it the way the doctor ordered.  Call the office if you cannot afford the medicine or if there are questions about it.     Notes:        Patient Care Plan: General Pharmacy (Adult)     Problem Identified: HTN, Hypothyroidism, HLD, Back Pain, Pre-DM   Priority: High  Onset Date: 12/24/2020  Note:   Current Barriers:  Unable to achieve control of cholesterol   Pharmacist Clinical Goal(s):  Patient will achieve adherence to monitoring guidelines and medication adherence to achieve therapeutic efficacy achieve control of cholesterol as evidenced by lipid panel adhere to plan to optimize therapeutic regimen for lipids as evidenced by report of adherence to recommended medication management changes contact provider office for questions/concerns as evidenced notation of same in electronic health record through collaboration with PharmD and provider.   Interventions: 1:1 collaboration with Lavera Guise, MD regarding development and update of comprehensive plan of care as evidenced  by provider attestation and co-signature Inter-disciplinary care team collaboration (see longitudinal plan of care) Comprehensive medication review performed; medication list updated in electronic medical record  Hypertension (BP goal <140/90) -Controlled -Current treatment: Valsartan/HCTZ 80-12.5mg  daily -Medications previously tried: olmesartan, diltiazem  -Current home readings: 141/71, no logs today -Current dietary habits: see DM -Current exercise habits: very active mowing lawn, swims in backyard pool, walking -Denies hypotensive/hypertensive symptoms -Educated on BP goals and benefits of medications for prevention of heart attack, stroke and kidney damage; Exercise goal of 150 minutes per week; Importance of home blood pressure monitoring; Symptoms of hypotension and importance of maintaining adequate hydration; -Counseled to monitor BP at home 3 times weekly, document, and provide log at future appointments -Recommended to continue current medication -Will f/u on BP readings next month.  Hyperlipidemia: (LDL goal < 100) -Uncontrolled -Current treatment: Zetia 10mg  Fenofibrate 54mg   -Medications previously tried: ALL STATINS (elevated LFTs and cramping)  -Current dietary patterns: see DM -Current exercise habits: see above -Educated on Cholesterol goals;  Importance of limiting foods high in cholesterol; Exercise goal of 150 minutes per week; -She has tried all statins and had elevated LFTs, she has also tried Repatha but recalls high copay.  This was some time ago.  Most recent lipid panel showed increased LDL and TG's.  However, she has not been on Fenofibrate for about one month due to communication issue with refills.  This explains her elevated TG.  LDL continues to increase despite Zetia.  Only option at this point would be to try Repatha or Praluent.  Will reach out to insurance to see which one is covered -Counseled on diet and exercise extensively Recommended to  continue current medication Will consult with PCP and review best potential injectable option for patient.  Pre-Diabetes (A1c goal <6.5%) -Controlled -Current medications: None currently -Medications previously tried: none  -Current home glucose readings fasting glucose: not checking post prandial glucose: not checking -Denies hypoglycemic/hyperglycemic symptoms -Current meal patterns:  breakfast: oatmeal, cheerios, occasional eggs lunch: lots of vegetables  dinner:  snacks:  drinks: water -Current exercise: see above -Educated on A1c and blood sugar goals; Exercise goal of 150 minutes per week; Congratulated on recent improvements with A1c -Counseled to check feet daily and get yearly eye exams -Recommended continue current management strategyies  Hypothyroidism (Goal: Maintain TSH) -Not ideally controlled -Current treatment  Synthroid 148mcg daily - except half tablet on Wed and Sunday -Medications previously tried: none noted  -Most recent TSH still slightly below goal.  She reports that she feels tired.  Counseled on proper administration of medication 30 minutes before food or other meds on empty stomach.  I feel it is best she continues as is for now since it is so close to goal.  Will continue to monitor TSH. -Recommended to continue current medication  Patient Goals/Self-Care Activities Patient will:  - take medications as prescribed focus on medication adherence by pill box check blood pressure 3 times per week, document, and provide at future appointments  Follow Up Plan: The care management team will reach out to the patient again over the next 30 days.      Long-Range Goal: Patient-Specific Goal   Start Date: 03/28/2021  Expected End Date: 09/25/2021  This Visit's Progress: On track  Priority: High  Note:   Current Barriers:  Unable to achieve control of cholesterol   Pharmacist Clinical Goal(s):  Patient will achieve adherence to monitoring guidelines  and medication adherence to achieve therapeutic efficacy achieve control of cholesterol as evidenced by lipid panel adhere to plan to optimize therapeutic regimen for lipids as evidenced by report of adherence to recommended medication management changes contact provider office for questions/concerns as evidenced notation of same in electronic health record through collaboration with PharmD and provider.   Interventions: 1:1 collaboration with Lavera Guise, MD regarding development and update of comprehensive plan of care as evidenced by provider attestation and co-signature Inter-disciplinary care team collaboration (see longitudinal plan of care) Comprehensive medication review performed; medication list updated in electronic medical record  Hypertension (BP goal <140/90) -Controlled -Current treatment: Felodipine 10mg  24hr daily HCTZ 25mg  daily -Medications previously tried: olmesartan, diltiazem  -Current home readings: 141/71, no logs today -Current dietary habits: see DM -Current exercise habits: very active mowing lawn, swims in backyard pool, walking -Denies hypotensive/hypertensive symptoms -Educated on BP goals and benefits of medications for prevention of heart attack, stroke and kidney damage; Exercise goal of 150 minutes per week; Importance of home blood pressure monitoring; Symptoms of hypotension and importance of maintaining adequate hydration; -Counseled to monitor BP at home 3 times weekly, document, and provide log at future appointments -Recommended to continue current medication -Will f/u on BP readings next month.  Update 03/28/21 148/75 P 76, 187/95 P  72 (this morning reading) She is up to the full tablet of Felodipine and BP still uncontrolled.  She is taking her meds in the morning and most of her higher BP readings are in the morning right before she takes the next dose.  Have asked her to try taking the Felodipine at night for about two weeks, will follow up  with her on if this affects BP.  In the mean time we may want to get labs to see if thyroid is affecting BP.  Hyperlipidemia: (LDL goal < 100) -Uncontrolled -Current treatment: Zetia 10mg  Fenofibrate 54mg   -Medications previously tried: ALL STATINS (elevated LFTs and cramping)  -Current dietary patterns: see DM -Current exercise habits: see above -Educated on Cholesterol goals;  Importance of limiting foods high in cholesterol; Exercise goal of 150 minutes per week; -She has tried all statins and had elevated LFTs, she has also tried Repatha but recalls high copay.  This was some time ago.  Most recent lipid panel showed increased LDL and TG's.  However, she has not been on Fenofibrate for about one month due to communication issue with refills.  This explains her elevated TG.  LDL continues to increase despite Zetia.  Only option at this point would be to try Repatha or Praluent.  Will reach out to insurance to see which one is covered -Counseled on diet and exercise extensively Recommended to continue current medication Will consult with PCP and review best potential injectable option for patient.  Update 03/28/21 Back on her fenofibrate, patient needs new labs.  She declines any injectables at this time due to cost.  She does not qualify for any PAP programs, will consider [facility] next year once grant opens back up.  Recommend recheck lipid panel.  Pre-Diabetes (A1c goal <6.5%) -Controlled -Current medications: None currently -Medications previously tried: none  -Current home glucose readings fasting glucose: not checking post prandial glucose: not checking -Denies hypoglycemic/hyperglycemic symptoms -Current meal patterns:  breakfast: oatmeal, cheerios, occasional eggs lunch: lots of vegetables  dinner:  snacks:  drinks: water -Current exercise: see above -Educated on A1c and blood sugar goals; Exercise goal of 150 minutes per week; Congratulated on recent improvements  with A1c -Counseled to check feet daily and get yearly eye exams -Recommended continue current management strategyies  Hypothyroidism (Goal: Maintain TSH) -Not ideally controlled -Current treatment  Synthroid 129mcg daily - except half tablet on Wed and Sunday -Medications previously tried: none noted  -Most recent TSH still slightly below goal.  She reports that she feels tired.  Counseled on proper administration of medication 30 minutes before food or other meds on empty stomach.  I feel it is best she continues as is for now since it is so close to goal.  Will continue to monitor TSH. -Recommended to continue current medication  Update 03/28/21 Continues same dose as last visit.  Reports she does not feel tired as much any more.  Is now taking med in the morning on empty stomach.  She needs new thyroid labs as she has not had them checked since it was slightly low in June.  Would recommend recheck.  Low TSH could be causing some of her elevated BP and elevated lipids.  Recommend recheck labs and correct thyroid replacement if needed.  Patient Goals/Self-Care Activities Patient will:  - take medications as prescribed focus on medication adherence by pill box check blood pressure 3 times per week, document, and provide at future appointments  Follow Up Plan: The care management team will  reach out to the patient again over the next 30 days.           Patient verbalizes understanding of instructions provided today and agrees to view in [place].  Telephone follow up appointment with pharmacy team member scheduled for:  Edythe Clarity, [place]

## 2021-04-03 ENCOUNTER — Other Ambulatory Visit: Payer: Self-pay | Admitting: Nurse Practitioner

## 2021-04-03 ENCOUNTER — Telehealth: Payer: Self-pay

## 2021-04-03 DIAGNOSIS — E559 Vitamin D deficiency, unspecified: Secondary | ICD-10-CM

## 2021-04-03 DIAGNOSIS — E119 Type 2 diabetes mellitus without complications: Secondary | ICD-10-CM

## 2021-04-03 DIAGNOSIS — E782 Mixed hyperlipidemia: Secondary | ICD-10-CM

## 2021-04-03 DIAGNOSIS — E039 Hypothyroidism, unspecified: Secondary | ICD-10-CM

## 2021-04-03 NOTE — Telephone Encounter (Signed)
Pt aware labs order in symptoms

## 2021-04-03 NOTE — Telephone Encounter (Signed)
Lab orders are in for [facility] please let patient know she can go to any [facility] but be sure to be fasting thank you

## 2021-04-03 NOTE — Telephone Encounter (Signed)
Labs order in epic and do fasting

## 2021-04-04 DIAGNOSIS — I10 Essential (primary) hypertension: Secondary | ICD-10-CM | POA: Diagnosis not present

## 2021-04-04 DIAGNOSIS — E039 Hypothyroidism, unspecified: Secondary | ICD-10-CM | POA: Diagnosis not present

## 2021-04-04 DIAGNOSIS — E119 Type 2 diabetes mellitus without complications: Secondary | ICD-10-CM | POA: Diagnosis not present

## 2021-04-04 DIAGNOSIS — E782 Mixed hyperlipidemia: Secondary | ICD-10-CM | POA: Diagnosis not present

## 2021-04-04 DIAGNOSIS — E559 Vitamin D deficiency, unspecified: Secondary | ICD-10-CM | POA: Diagnosis not present

## 2021-04-05 LAB — CMP14+EGFR
ALT: 32 IU/L (ref 0–32)
AST: 29 IU/L (ref 0–40)
Albumin/Globulin Ratio: 2.1 (ref 1.2–2.2)
Albumin: 4.4 g/dL (ref 3.7–4.7)
Alkaline Phosphatase: 55 IU/L (ref 44–121)
BUN/Creatinine Ratio: 13 (ref 12–28)
BUN: 12 mg/dL (ref 8–27)
Bilirubin Total: 1 mg/dL (ref 0.0–1.2)
CO2: 26 mmol/L (ref 20–29)
Calcium: 9.9 mg/dL (ref 8.7–10.3)
Chloride: 103 mmol/L (ref 96–106)
Creatinine, Ser: 0.9 mg/dL (ref 0.57–1.00)
Globulin, Total: 2.1 g/dL (ref 1.5–4.5)
Glucose: 128 mg/dL — ABNORMAL HIGH (ref 70–99)
Potassium: 3.9 mmol/L (ref 3.5–5.2)
Sodium: 145 mmol/L — ABNORMAL HIGH (ref 134–144)
Total Protein: 6.5 g/dL (ref 6.0–8.5)
eGFR: 68 mL/min/1.73

## 2021-04-05 LAB — CBC WITH DIFFERENTIAL/PLATELET
Basophils Absolute: 0.1 10*3/uL (ref 0.0–0.2)
Basos: 1 %
EOS (ABSOLUTE): 0.2 10*3/uL (ref 0.0–0.4)
Eos: 6 %
Hematocrit: 44.5 % (ref 34.0–46.6)
Hemoglobin: 15.3 g/dL (ref 11.1–15.9)
Immature Grans (Abs): 0 10*3/uL (ref 0.0–0.1)
Immature Granulocytes: 0 %
Lymphocytes Absolute: 1.2 10*3/uL (ref 0.7–3.1)
Lymphs: 32 %
MCH: 30.1 pg (ref 26.6–33.0)
MCHC: 34.4 g/dL (ref 31.5–35.7)
MCV: 87 fL (ref 79–97)
Monocytes Absolute: 0.5 10*3/uL (ref 0.1–0.9)
Monocytes: 12 %
Neutrophils Absolute: 1.8 10*3/uL (ref 1.4–7.0)
Neutrophils: 49 %
Platelets: 234 10*3/uL (ref 150–450)
RBC: 5.09 x10E6/uL (ref 3.77–5.28)
RDW: 12.9 % (ref 11.7–15.4)
WBC: 3.8 10*3/uL (ref 3.4–10.8)

## 2021-04-05 LAB — LIPID PANEL
Chol/HDL Ratio: 5.5 ratio — ABNORMAL HIGH (ref 0.0–4.4)
Cholesterol, Total: 264 mg/dL — ABNORMAL HIGH (ref 100–199)
HDL: 48 mg/dL
LDL Chol Calc (NIH): 179 mg/dL — ABNORMAL HIGH (ref 0–99)
Triglycerides: 199 mg/dL — ABNORMAL HIGH (ref 0–149)
VLDL Cholesterol Cal: 37 mg/dL (ref 5–40)

## 2021-04-05 LAB — TSH+FREE T4
Free T4: 1.71 ng/dL (ref 0.82–1.77)
TSH: 1.22 u[IU]/mL (ref 0.450–4.500)

## 2021-04-05 LAB — VITAMIN D 25 HYDROXY (VIT D DEFICIENCY, FRACTURES): Vit D, 25-Hydroxy: 40.3 ng/mL (ref 30.0–100.0)

## 2021-05-08 DIAGNOSIS — H524 Presbyopia: Secondary | ICD-10-CM | POA: Diagnosis not present

## 2021-05-08 DIAGNOSIS — H52203 Unspecified astigmatism, bilateral: Secondary | ICD-10-CM | POA: Diagnosis not present

## 2021-05-08 DIAGNOSIS — Z961 Presence of intraocular lens: Secondary | ICD-10-CM | POA: Diagnosis not present

## 2021-05-14 ENCOUNTER — Telehealth: Payer: Self-pay | Admitting: Student-PharmD

## 2021-05-14 ENCOUNTER — Telehealth: Payer: Self-pay

## 2021-05-14 NOTE — Telephone Encounter (Signed)
Left vm to confirm 05/16/21 appointment-Toni

## 2021-05-14 NOTE — Progress Notes (Addendum)
Chronic Care Management Pharmacy Assistant   Name: Toni [name]  MRN: [id number] DOB: Nov 02, 1945  Reason for Encounter: Disease State For HTN.    Conditions to be addressed/monitored: HTN, Hypothyroidism, HLD, Back Pain, Pre-DM  Recent office visits:  None since last Pharm D visit on 03/28/21  Recent consult visits:  05/08/21 Ophthalmology Hyman Hopes, MD For eye exam Per note: Olmesartan STOPPED.   Hospital visits:  None since last Pharm D visit on 03/28/21  Medications: Outpatient Encounter Medications as of 05/14/2021  Medication Sig   aspirin 81 MG tablet Take 81 mg by mouth daily.   Calcium Carbonate-Vitamin D (CALCIUM + D PO) Take 1 tablet by mouth daily.    cetirizine (ZYRTEC) 10 MG tablet Take 10 mg by mouth as needed.    cholecalciferol (VITAMIN D) 1000 units tablet Take 1,000 Units by mouth daily.   ezetimibe (ZETIA) 10 MG tablet TAKE 1 TABLET BY MOUTH EVERY DAY   felodipine (PLENDIL) 10 MG 24 hr tablet Take 1 tablet (10 mg total) by mouth daily.   fenofibrate 54 MG tablet TAKE 1 TABLET BY MOUTH EVERY DAY FOR ELEVATED CHOLESTEROL   hydrochlorothiazide (HYDRODIURIL) 25 MG tablet Take 1 tablet (25 mg total) by mouth daily.   pantoprazole (PROTONIX) 40 MG tablet Take 1 tablet (40 mg total) by mouth daily. t   pneumococcal 23 valent vaccine (PNEUMOVAX 23) 25 MCG/0.5ML injection Inject 0.90ml IM once   SYNTHROID 112 MCG tablet Take 1 tablet po QAM   Facility-Administered Encounter Medications as of 05/14/2021  Medication   0.9 %  sodium chloride infusion   Reviewed chart prior to disease state call. Spoke with patient regarding BP  Recent Office Vitals: BP Readings from Last 3 Encounters:  03/07/21 130/72  02/06/21 140/78  12/17/20 (!) 141/77   Pulse Readings from Last 3 Encounters:  03/07/21 70  02/06/21 69  12/17/20 71    Wt Readings from Last 3 Encounters:  03/07/21 164 lb 3.2 oz (74.5 kg)  02/06/21 165 lb 6.4 oz (75 kg)  12/17/20 164 lb 3.2 oz  (74.5 kg)     Kidney Function Lab Results  Component Value Date/Time   CREATININE 0.90 04/04/2021 08:44 AM   CREATININE 0.79 12/23/2020 07:07 AM   CREATININE 0.97 09/02/2011 08:11 AM   GFRNONAA 75 05/27/2020 09:20 AM   GFRNONAA >60 09/02/2011 08:11 AM   GFRAA 86 05/27/2020 09:20 AM   GFRAA >60 09/02/2011 08:11 AM    BMP Latest Ref Rng & Units 04/04/2021 12/23/2020 05/27/2020  Glucose 70 - 99 mg/dL 128(H) 125(H) 153(H)  BUN 8 - 27 mg/dL 12 17 14   Creatinine 0.57 - 1.00 mg/dL 0.90 0.79 0.79  BUN/Creat Ratio 12 - 28 13 22 18   Sodium 134 - 144 mmol/L 145(H) 142 142  Potassium 3.5 - 5.2 mmol/L 3.9 4.0 4.4  Chloride 96 - 106 mmol/L 103 103 104  CO2 20 - 29 mmol/L 26 28 26   Calcium 8.7 - 10.3 mg/dL 9.9 9.7 9.8    Current antihypertensive regimen:  Felodipine 10 mg 1 tablet (10 mg total) by mouth daily.  How often are you checking your Blood Pressure? Patient stated 3-5x per week  Current home BP readings: Patient stated her blood pressure readings are exteremly elevated. She stated one of her most recent blood pressure readings were 200/100  What recent interventions/DTPs have been made by any provider to improve Blood Pressure control since last CPP Visit:  STOPPED:Valsartan/HCTZ 80-12.5mg  daily-Because of the cough but she still  has the cough and the cough is also a side effect of another medication.   Any recent hospitalizations or ED visits since last visit with CPP?  Patient stated no.   What diet changes have been made to improve Blood Pressure Control?  Patient stated her diet is regular. She stated she drinks plenty regular.  What exercise is being done to improve your Blood Pressure Control?  Patient stated she is very active. 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  Adherence Review: Is the patient currently on ACE/ARB medication? N/A.  Does the patient have >5 day gap between last estimated fill dates? N/A.  Care Gaps:Patient is due for her mammogram(Per  patient she completed her mammogram in may/June of 2022).  Star Rating Drugs:N/A.  Follow-Up:Pharmacist Review  Charlann Lange, RMA Clinical Pharmacist Assistant [phone]  5 minutes spent in review, coordination, and documentation.  Reviewed by: Alena Bills, PharmD Clinical Pharmacist [phone]

## 2021-05-16 ENCOUNTER — Other Ambulatory Visit: Payer: Self-pay

## 2021-05-16 ENCOUNTER — Encounter (INDEPENDENT_AMBULATORY_CARE_PROVIDER_SITE_OTHER): Payer: Self-pay

## 2021-05-16 ENCOUNTER — Ambulatory Visit (INDEPENDENT_AMBULATORY_CARE_PROVIDER_SITE_OTHER): Payer: PPO | Admitting: Nurse Practitioner

## 2021-05-16 ENCOUNTER — Encounter: Payer: Self-pay | Admitting: Nurse Practitioner

## 2021-05-16 VITALS — BP 177/79 | HR 67 | Temp 98.5°F | Resp 16 | Ht 66.5 in | Wt 163.8 lb

## 2021-05-16 DIAGNOSIS — R300 Dysuria: Secondary | ICD-10-CM | POA: Diagnosis not present

## 2021-05-16 DIAGNOSIS — E039 Hypothyroidism, unspecified: Secondary | ICD-10-CM

## 2021-05-16 DIAGNOSIS — I10 Essential (primary) hypertension: Secondary | ICD-10-CM

## 2021-05-16 DIAGNOSIS — E119 Type 2 diabetes mellitus without complications: Secondary | ICD-10-CM

## 2021-05-16 DIAGNOSIS — Z0001 Encounter for general adult medical examination with abnormal findings: Secondary | ICD-10-CM | POA: Diagnosis not present

## 2021-05-16 DIAGNOSIS — E870 Hyperosmolality and hypernatremia: Secondary | ICD-10-CM

## 2021-05-16 DIAGNOSIS — E782 Mixed hyperlipidemia: Secondary | ICD-10-CM | POA: Diagnosis not present

## 2021-05-16 MED ORDER — LISINOPRIL 20 MG PO TABS: 20.0000 mg | ORAL_TABLET | Freq: Every day | ORAL | 1 refills | Status: DC

## 2021-05-16 NOTE — Progress Notes (Signed)
[facility] [place] [place], [place] [address]  Internal MEDICINE  Office Visit Note  Patient Name: Toni [name]  [id number]  [id number]  Date of Service: 05/16/2021  Chief Complaint  Patient presents with   Medicare Wellness    Discuss BP, right knee, nasal drainage    Diabetes   Gastroesophageal Reflux   Hyperlipidemia   Hypertension    HPI Toni [name] presents for an annual well visit and physical exam.She is a well appearing 75 yo female. She had her mammogram during the summer this year and it was normal. Her BMD screening showed osteopenia in 2020. She had her labs drawn prior to her office visit today. Her vitamin D level and metabolic panel is normal. Her thyroid levels are normal.  Her right knee has some arthritis that bothers her sometimes but she is managing with OTC medications. She is interested in getting the pneumonia vaccine. She takes hydrochlorothiazide for hypertension. She has tried amlodipine, diltiazem, felodipine, valsartan and olmesartan.  The 10-year ASCVD risk score (Arnett DK, et al., 2019) is: 52.5%   Values used to calculate the score:     Age: 83 years     Sex: Female     Is Non-Hispanic African American: No     Diabetic: Yes     Tobacco smoker: No     Systolic Blood Pressure: 242 mmHg     Is BP treated: Yes     HDL Cholesterol: 48 mg/dL     Total Cholesterol: 264 mg/dL     Current Medication: Outpatient Encounter Medications as of 05/16/2021  Medication Sig   aspirin 81 MG tablet Take 81 mg by mouth daily.   Calcium Carbonate-Vitamin D (CALCIUM + D PO) Take 1 tablet by mouth daily.    cetirizine (ZYRTEC) 10 MG tablet Take 10 mg by mouth as needed.    cholecalciferol (VITAMIN D) 1000 units tablet Take 1,000 Units by mouth daily.   ezetimibe (ZETIA) 10 MG tablet TAKE 1 TABLET BY MOUTH EVERY DAY   fenofibrate 54 MG tablet TAKE 1 TABLET BY MOUTH EVERY DAY FOR ELEVATED CHOLESTEROL   hydrochlorothiazide (HYDRODIURIL) 25 MG  tablet Take 1 tablet (25 mg total) by mouth daily.   pantoprazole (PROTONIX) 40 MG tablet Take 1 tablet (40 mg total) by mouth daily. t   pneumococcal 23 valent vaccine (PNEUMOVAX 23) 25 MCG/0.5ML injection Inject 0.103ml IM once   SYNTHROID 112 MCG tablet Take 1 tablet po QAM   [DISCONTINUED] felodipine (PLENDIL) 10 MG 24 hr tablet Take 1 tablet (10 mg total) by mouth daily.   [DISCONTINUED] lisinopril (ZESTRIL) 20 MG tablet Take 1 tablet (20 mg total) by mouth daily.   Facility-Administered Encounter Medications as of 05/16/2021  Medication   0.9 %  sodium chloride infusion    Surgical History: Past Surgical History:  Procedure Laterality Date   APPENDECTOMY     BUNIONECTOMY     right foot   [facility]    CATARACT EXTRACTION, BILATERAL Left 10/20/2017   CATARACT EXTRACTION, BILATERAL Right 10/27/2017   CHOLECYSTECTOMY     COLONOSCOPY     HAMMER TOE SURGERY  12/2012   left toe   PARTIAL HYSTERECTOMY     POLYPECTOMY     ROBOTIC ASSISTED SALPINGO OOPHERECTOMY     right ovary first removed then left ovary removed years later   TONSILLECTOMY  age 13   TOTAL THYROIDECTOMY  09/2011    Medical History: Past Medical History:  Diagnosis Date   Allergic rhinitis    Allergy    Aortic atherosclerosis (HCC)    Diabetes mellitus, type 2 (HCC)    no medicines- was pre diabtes- diet controlled    Diverticulitis    Essential hypertension    GERD (gastroesophageal reflux disease)    Hyperlipidemia    Hypertension    Hypothyroidism    Obesity    Osteoporosis    Transient insomnia    Tubular adenoma of colon 05/2013    Family History: Family History  Problem Relation Age of Onset   Throat cancer Father    Esophageal cancer Father    Colon cancer Maternal Aunt    Colon polyps Maternal Aunt    Colon cancer Maternal Uncle    Colon polyps Maternal Uncle    Heart disease Maternal Uncle    Stomach cancer Maternal Uncle    Prostate cancer  Maternal Uncle    Breast cancer Cousin    Diabetes Neg Hx    Kidney disease Neg Hx    Gallbladder disease Neg Hx    Rectal cancer Neg Hx     Social History   Socioeconomic History   Marital status: Married    Spouse name: Not on file   Number of children: 2   Years of education: Not on file   Highest education level: Not on file  Occupational History   Occupation: Retired  Tobacco Use   Smoking status: Former    Packs/day: 1.00    Years: 30.00    Pack years: 30.00    Types: Cigarettes    Quit date: 07/06/1998    Years since quitting: 22.9   Smokeless tobacco: Never  Substance and Sexual Activity   Alcohol use: No    Alcohol/week: 0.0 standard drinks   Drug use: No   Sexual activity: Not on file  Other Topics Concern   Not on file  Social History Narrative   Not on file   Social Determinants of Health   Financial Resource Strain: Low Risk    Difficulty of Paying Living Expenses: Not hard at all  Food Insecurity: Not on file  Transportation Needs: Not on file  Physical Activity: Not on file  Stress: Not on file  Social Connections: Not on file  Intimate Partner Violence: Not on file      Review of Systems  Constitutional:  Negative for activity change, appetite change, chills, fatigue, fever and unexpected weight change.  HENT: Negative.  Negative for congestion, ear pain, rhinorrhea, sore throat and trouble swallowing.   Eyes: Negative.   Respiratory: Negative.  Negative for cough, chest tightness, shortness of breath and wheezing.   Cardiovascular: Negative.  Negative for chest pain.  Gastrointestinal: Negative.  Negative for abdominal pain, blood in stool, constipation, diarrhea, nausea and vomiting.  Endocrine: Negative.   Genitourinary: Negative.  Negative for difficulty urinating, dysuria, frequency, hematuria and urgency.  Musculoskeletal:  Positive for arthralgias. Negative for back pain, joint swelling, myalgias and neck pain.  Skin: Negative.   Negative for rash and wound.  Allergic/Immunologic: Negative.  Negative for immunocompromised state.  Neurological: Negative.  Negative for dizziness, seizures, numbness and headaches.  Hematological: Negative.   Psychiatric/Behavioral: Negative.  Negative for behavioral problems, self-injury and suicidal ideas. The patient is not nervous/anxious.    Vital Signs: BP (!) 177/79   Pulse 67   Temp 98.5 F (36.9 C)   Resp 16   Ht 5' 6.5" (1.689 m)   Wt 163 lb 12.8 oz (  74.3 kg)   SpO2 98%   BMI 26.04 kg/m    Physical Exam Vitals reviewed.  Constitutional:      General: She is awake. She is not in acute distress.    Appearance: Normal appearance. She is well-developed, well-groomed and normal weight. She is not ill-appearing or diaphoretic.  HENT:     Head: Normocephalic and atraumatic.     Right Ear: Tympanic membrane, ear canal and external ear normal.     Left Ear: Tympanic membrane, ear canal and external ear normal.     Nose: Nose normal. No congestion or rhinorrhea.     Mouth/Throat:     Lips: Pink.     Mouth: Mucous membranes are moist.     Pharynx: Oropharynx is clear. Uvula midline. No oropharyngeal exudate or posterior oropharyngeal erythema.  Eyes:     General: Lids are normal. Vision grossly intact. Gaze aligned appropriately. No scleral icterus.       Right eye: No discharge.        Left eye: No discharge.     Extraocular Movements: Extraocular movements intact.     Conjunctiva/sclera: Conjunctivae normal.     Pupils: Pupils are equal, round, and reactive to light.     Funduscopic exam:    Right eye: Red reflex present.        Left eye: Red reflex present. Neck:     Thyroid: No thyromegaly.     Vascular: No carotid bruit or JVD.     Trachea: Trachea and phonation normal. No tracheal deviation.  Cardiovascular:     Rate and Rhythm: Normal rate and regular rhythm.     Pulses: Normal pulses.     Heart sounds: Normal heart sounds, S1 normal and S2 normal. No  murmur heard.   No friction rub. No gallop.  Pulmonary:     Effort: Pulmonary effort is normal. No accessory muscle usage or respiratory distress.     Breath sounds: Normal breath sounds and air entry. No stridor. No wheezing or rales.  Chest:     Chest wall: No tenderness.     Comments: Declines clinical breast exam, gets annual mammograms.  Abdominal:     General: Bowel sounds are normal. There is no distension.     Palpations: Abdomen is soft. There is no shifting dullness, fluid wave, mass or pulsatile mass.     Tenderness: There is no abdominal tenderness. There is no guarding or rebound.  Musculoskeletal:        General: No tenderness or deformity. Normal range of motion.     Cervical back: Normal range of motion and neck supple.  Lymphadenopathy:     Cervical: No cervical adenopathy.  Skin:    General: Skin is warm and dry.     Capillary Refill: Capillary refill takes less than 2 seconds.     Coloration: Skin is not pale.     Findings: No erythema or rash.  Neurological:     Mental Status: She is alert and oriented to person, place, and time.     Cranial Nerves: No cranial nerve deficit.     Motor: No abnormal muscle tone.     Coordination: Coordination normal.     Deep Tendon Reflexes: Reflexes are normal and symmetric.  Psychiatric:        Behavior: Behavior normal. Behavior is cooperative.        Thought Content: Thought content normal.        Judgment: Judgment normal.  Assessment/Plan: 1. Encounter for general adult medical examination with abnormal findings Age-appropriate preventive screenings and vaccinations discussed, annual physical exam completed. Routine labs for health maintenance done and discussed today. Repeat labs ordered, see below. PHM updated.   2. Essential hypertension Start lisinopril 20 mg daily. Follow up in 4 weeks.   3. Diet-controlled type 2 diabetes mellitus ([place]) Continue working on diet modifications.   4. Acquired  hypothyroidism Add the half tab back 7 days a week, repeat TSH and free T4 in 6 weeks.  - TSH + free T4  5. Mixed hyperlipidemia Working on diet and lifestyle modifications, also taking fenofibrate and ezetimibe.   6. Hypernatremia Reapt sodium level to assess for resolution  - Sodium  7. Dysuria Routine urinalysis done - UA/M w/rflx Culture, Routine - Microscopic Examination      General Counseling: neita landrigan understanding of the findings of todays visit and agrees with plan of treatment. I have discussed any further diagnostic evaluation that may be needed or ordered today. We also reviewed her medications today. she has been encouraged to call the office with any questions or concerns that should arise related to todays visit.    Orders Placed This Encounter  Procedures   Microscopic Examination   UA/M w/rflx Culture, Routine   TSH + free T4   Sodium    Meds ordered this encounter  Medications   DISCONTD: lisinopril (ZESTRIL) 20 MG tablet    Sig: Take 1 tablet (20 mg total) by mouth daily.    Dispense:  30 tablet    Refill:  1    Return in about 6 weeks (around 06/27/2021) for F/U, BP check, eval new med, Yong Grieser PCP get thyroid labs and sodium level before visit.   Total time spent:30 Minutes Time spent includes review of chart, medications, test results, and follow up plan with the patient.   [place] Controlled Substance Database was reviewed by me.  This patient was seen by [name], FNP-C in collaboration with Dr. Clayborn Bigness as a part of collaborative care agreement.  Belmira Daley R. Valetta Fuller, MSN, FNP-C Internal medicine

## 2021-05-17 LAB — UA/M W/RFLX CULTURE, ROUTINE
Bilirubin, UA: NEGATIVE
Glucose, UA: NEGATIVE
Ketones, UA: NEGATIVE
Leukocytes,UA: NEGATIVE
Nitrite, UA: NEGATIVE
Protein,UA: NEGATIVE
RBC, UA: NEGATIVE
Specific Gravity, UA: 1.017 (ref 1.005–1.030)
Urobilinogen, Ur: 0.2 mg/dL (ref 0.2–1.0)
pH, UA: 5 (ref 5.0–7.5)

## 2021-05-17 LAB — MICROSCOPIC EXAMINATION
Bacteria, UA: NONE SEEN
Casts: NONE SEEN /lpf
RBC, Urine: NONE SEEN /hpf (ref 0–2)
WBC, UA: NONE SEEN /hpf (ref 0–5)

## 2021-05-31 NOTE — Progress Notes (Signed)
Chronic Care Management Pharmacy Note  06/02/2021 Name:  Toni [name] MRN:  [id number] DOB:  [birth year]  Summary: Patient reports systolic blood pressure is still running in the 160s and she is still having a cough. Patient reports she is unsure if cough is from medication or seasonal allergies. Patient states she currently takes zyrtec as needed and this has not helped with cough. Will switch to claritin or allegra to see if this helps. Patient still has uncontrolled cholesterol, is open to starting Repatha if she can qualify for PAP, will consider that once she sees annual household income for 2022  Recommendations/Changes made from today's visit: Recommended patient switch to claritin or allegra OTC and take daily for 2-3 weeks to see if this relieves cough.   Plan: May consider increasing lisinopril dose if cough goes away to get control of blood pressure. F/u with health concierge in 1 month F/U with pharmacist in 09/2021   Subjective: Toni [name] is an 75 y.o. year old female who is a primary patient of Toni Rolls Timoteo Gaul, MD.  The CCM team was consulted for assistance with disease management and care coordination needs.    Engaged with patient via telelphone for follow up visit in response to provider referral for pharmacy case management and/or care coordination services.   Consent to Services:  The patient was given the following information about Chronic Care Management services today, agreed to services, and gave verbal consent: 1. CCM service includes personalized support from designated clinical staff supervised by the primary care provider, including individualized plan of care and coordination with other care providers 2. 24/7 contact phone numbers for assistance for urgent and routine care needs. 3. Service will only be billed when office clinical staff spend 20 minutes or more in a month to coordinate care. 4. Only one practitioner may furnish and bill the service in a  calendar month. 5.The patient may stop CCM services at any time (effective at the end of the month) by phone call to the office staff. 6. The patient will be responsible for cost sharing (co-pay) of up to 20% of the service fee (after annual deductible is met). Patient agreed to services and consent obtained.  Patient Care Team: Toni Guise, MD as PCP - General (Internal Medicine) Toni [name], [facility] as Pharmacist (Pharmacist)  Recent office visits:  05/16/21 Toni [name] - BP elevated. STARTED lisinopril 51m daily 03/07/21 [name], Toni - felodipine dose increased again as BP uncontrolled starting with 7.538mand titrating up to 1086maily 02/06/21 AbeJonetta Osgoodstarted on felodipine and HCTZ due to persistent cough with Valsartan/HCTZ 12/17/20 AbeJonetta OsgoodP. For follow-up. No medication changes.    Recent consult visits:  05/08/21: Opthamalogy, [name] med changes   Hospital visits:  None in previous 6 months   Medication History: Lisinopril 44m79mily: 05/16/21: 30 DS  Care Gaps:  Mammogram and Colonoscopy Blood pressure not at goal     Objective:  Lab Results  Component Value Date   CREATININE 0.90 04/04/2021   BUN 12 04/04/2021   GFRNONAA 75 05/27/2020   GFRAA 86 05/27/2020   NA 145 (H) 04/04/2021   K 3.9 04/04/2021   CALCIUM 9.9 04/04/2021   CO2 26 04/04/2021   GLUCOSE 128 (H) 04/04/2021    Lab Results  Component Value Date/Time   HGBA1C 6.6 (A) 03/07/2021 08:36 AM   HGBA1C 5.9 (A) 12/17/2020 12:15 PM   HGBA1C 6.1 (H) 05/12/2019 08:11 AM   HGBA1C 5.9 (  H) 04/28/2018 08:48 AM    Last diabetic Eye exam: No results found for: HMDIABEYEEXA  Last diabetic Foot exam: No results found for: HMDIABFOOTEX   Lab Results  Component Value Date   CHOL 264 (H) 04/04/2021   HDL 48 04/04/2021   LDLCALC 179 (H) 04/04/2021   TRIG 199 (H) 04/04/2021   CHOLHDL 5.5 (H) 04/04/2021    Hepatic Function Latest Ref Rng & Units 04/04/2021  12/23/2020 05/27/2020  Total Protein 6.0 - 8.5 g/dL 6.5 6.6 6.4  Albumin 3.7 - 4.7 g/dL 4.4 4.5 4.3  AST 0 - 40 IU/L '29 24 21  ' ALT 0 - 32 IU/L 32 26 28  Alk Phosphatase 44 - 121 IU/L 55 53 57  Total Bilirubin 0.0 - 1.2 mg/dL 1.0 1.0 0.6    Lab Results  Component Value Date/Time   TSH 1.220 04/04/2021 08:44 AM   TSH 0.434 (L) 12/23/2020 07:07 AM   FREET4 1.71 04/04/2021 08:44 AM   FREET4 1.58 12/23/2020 07:07 AM    CBC Latest Ref Rng & Units 04/04/2021 12/23/2020 05/27/2020  WBC 3.4 - 10.8 x10E3/uL 3.8 5.3 4.2  Hemoglobin 11.1 - 15.9 g/dL 15.3 15.6 15.1  Hematocrit 34.0 - 46.6 % 44.5 46.4 44.3  Platelets 150 - 450 x10E3/uL 234 183 221    Lab Results  Component Value Date/Time   VD25OH 40.3 04/04/2021 08:44 AM   VD25OH 30.9 12/23/2020 07:07 AM    Clinical ASCVD: No  The 10-year ASCVD risk score (Arnett DK, et al., 2019) is: 52.5%   Values used to calculate the score:     Age: 75 years     Sex: Female     Is Non-Hispanic African American: No     Diabetic: Yes     Tobacco smoker: No     Systolic Blood Pressure: 341 mmHg     Is BP treated: Yes     HDL Cholesterol: 48 mg/dL     Total Cholesterol: 264 mg/dL    Depression screen [facility] 2/9 05/16/2021 02/06/2021 09/10/2020  Decreased Interest 0 0 0  Down, Depressed, Hopeless 0 0 0  PHQ - 2 Score 0 0 0      Social History   Tobacco Use  Smoking Status Former   Packs/day: 1.00   Years: 30.00   Pack years: 30.00   Types: Cigarettes   Quit date: 07/06/1998   Years since quitting: 22.9  Smokeless Tobacco Never   BP Readings from Last 3 Encounters:  05/16/21 (!) 177/79  03/07/21 130/72  02/06/21 140/78   Pulse Readings from Last 3 Encounters:  05/16/21 67  03/07/21 70  02/06/21 69   Wt Readings from Last 3 Encounters:  05/16/21 163 lb 12.8 oz (74.3 kg)  03/07/21 164 lb 3.2 oz (74.5 kg)  02/06/21 165 lb 6.4 oz (75 kg)   BMI Readings from Last 3 Encounters:  05/16/21 26.04 kg/m  03/07/21 26.11 kg/m  02/06/21 26.70  kg/m    Assessment/Interventions: Review of patient past medical history, allergies, medications, health status, including review of consultants reports, laboratory and other test data, was performed as part of comprehensive evaluation and provision of chronic care management services.   SDOH:  (Social Determinants of Health) assessments and interventions performed: Yes  Financial Resource Strain: Low Risk    Difficulty of Paying Living Expenses: Not hard at all    SDOH Screenings   Alcohol Screen: Not on file  Depression (PHQ2-9): Low Risk    PHQ-2 Score: 0  Financial Resource Strain:  Low Risk    Difficulty of Paying Living Expenses: Not hard at all  Food Insecurity: Not on file  Housing: Not on file  Physical Activity: Not on file  Social Connections: Not on file  Stress: Not on file  Tobacco Use: Medium Risk   Smoking Tobacco Use: Former   Smokeless Tobacco Use: Never   Passive Exposure: Not on file  Transportation Needs: Not on file    [place]  Allergies  Allergen Reactions   Cefaclor     REACTION: swelling/hives   Penicillins     REACTION: swelling/hives   [place] [Pentazocine]     Medications Reviewed Today     Reviewed by Toni [name], [facility] (Pharmacist) on 06/02/21 at [place]  Med List Status: <None>   Medication Order Taking? Sig Documenting Provider Last Dose Status Informant  0.9 %  sodium chloride infusion [id number]   Ladene Artist, MD  Active   aspirin 81 MG tablet [id number] No Take 81 mg by mouth daily. [provider] Taking Active Self  Calcium Carbonate-Vitamin D (CALCIUM + D PO) [id number] No Take 1 tablet by mouth daily.  [provider] Taking Active Self  cetirizine (ZYRTEC) 10 MG tablet [id number] No Take 10 mg by mouth as needed.  [provider] Taking Active   cholecalciferol (VITAMIN D) 1000 units tablet [id number] No Take 1,000 Units by mouth daily. [provider] Taking Active   ezetimibe (ZETIA)  10 MG tablet [id number] No TAKE 1 TABLET BY MOUTH EVERY DAY Toni Guise, MD Taking Active   fenofibrate 54 MG tablet [id number] No TAKE 1 TABLET BY MOUTH EVERY DAY FOR ELEVATED CHOLESTEROL Toni Guise, MD Taking Active   hydrochlorothiazide (HYDRODIURIL) 25 MG tablet [id number] No Take 1 tablet (25 mg total) by mouth daily. Toni Osgood, NP Taking Active   lisinopril (ZESTRIL) 20 MG tablet [id number]  Take 1 tablet (20 mg total) by mouth daily. Toni Osgood, NP  Active   pantoprazole (PROTONIX) 40 MG tablet [id number] No Take 1 tablet (40 mg total) by mouth daily. t Toni Guise, MD Taking Active   pneumococcal 23 valent vaccine (PNEUMOVAX 23) 25 MCG/0.5ML injection [id number] No Inject 0.86m IM once BRonnell Freshwater NP Taking Active   SYNTHROID 112 MCG tablet [id number] Take 1 tablet po QAM KLavera Guise MD Taking Active             Patient Active Problem List   Diagnosis Date Noted   Needs flu shot 06/02/2020   Need for vaccination against Streptococcus pneumoniae using pneumococcal conjugate vaccine 13 06/02/2020   Pre-diabetes 11/25/2019   Lumbar back pain with radiculopathy affecting lower extremity 11/25/2019   Left sided sciatica 05/24/2019   Encounter for general adult medical examination with abnormal findings 05/02/2018   Cough 05/02/2018   Hypothyroidism 01/14/2018   Urinary tract infection with hematuria 01/14/2018   Dysuria 01/14/2018   Acute recurrent pansinusitis 01/14/2018   Atypical chest pain 12/13/2014   [place]    Mixed hyperlipidemia    Essential hypertension    Diverticulitis of colon without hemorrhage 11/13/2014    Immunization History  Administered Date(s) Administered   Influenza Inj Mdck Quad Pf 03/23/2019, 05/13/2020   Influenza, High Dose Seasonal PF 04/09/2017, 07/30/2018, 03/23/2019   Influenza-Unspecified 07/30/2018   PFIZER(Purple Top)SARS-COV-2 Vaccination 09/19/2019, 10/17/2019, 06/04/2020   Pneumococcal  Conjugate-13 04/09/2017   Pneumococcal Polysaccharide-23 05/13/2020   Tdap 03/12/2021   Zoster Recombinat (Shingrix) 03/23/2019, 05/28/2019    Conditions  to be addressed/monitored:  HTN, Hypothyroidism, HLD  Care Plan : [place]  Updates made by Toni [name], [facility] since 06/02/2021 12:00 AM     Problem: HTN, Hypothyroidism, HLD, Back Pain, Pre-DM   Priority: High  Onset Date: 12/24/2020  Note:   Current Barriers:  Unable to achieve control of cholesterol   Pharmacist Clinical Goal(s):  Patient will achieve adherence to monitoring guidelines and medication adherence to achieve therapeutic efficacy achieve control of cholesterol as evidenced by lipid panel adhere to plan to optimize therapeutic regimen for lipids as evidenced by report of adherence to recommended medication management changes contact provider office for questions/concerns as evidenced notation of same in electronic health record through collaboration with PharmD and provider.   Interventions: 1:1 collaboration with Toni Guise, MD regarding development and update of comprehensive plan of care as evidenced by provider attestation and co-signature Inter-disciplinary care team collaboration (see longitudinal plan of care) Comprehensive medication review performed; medication list updated in electronic medical record  Hypertension (BP goal <140/90) -Controlled -Current treatment: Valsartan/HCTZ 80-12.25m daily -Medications previously tried: olmesartan, diltiazem  -Current home readings: 141/71, no logs today -Current dietary habits: see DM -Current exercise habits: very active mowing lawn, swims in backyard pool, walking -Denies hypotensive/hypertensive symptoms -Educated on BP goals and benefits of medications for prevention of heart attack, stroke and kidney damage; Exercise goal of 150 minutes per week; Importance of home blood pressure monitoring; Symptoms of hypotension and importance of  maintaining adequate hydration; -Counseled to monitor BP at home 3 times weekly, document, and provide log at future appointments -Recommended to continue current medication -Will f/u on BP readings next month.  Hyperlipidemia: (LDL goal < 100) -Uncontrolled -Current treatment: Zetia 11mFenofibrate 5442m-Medications previously tried: ALL STATINS (elevated LFTs and cramping)  -Current dietary patterns: see DM -Current exercise habits: see above -Educated on Cholesterol goals;  Importance of limiting foods high in cholesterol; Exercise goal of 150 minutes per week; -She has tried all statins and had elevated LFTs, she has also tried Repatha but recalls high copay.  This was some time ago.  Most recent lipid panel showed increased LDL and TG's.  However, she has not been on Fenofibrate for about one month due to communication issue with refills.  This explains her elevated TG.  LDL continues to increase despite Zetia.  Only option at this point would be to try Repatha or Praluent.  Will reach out to insurance to see which one is covered -Counseled on diet and exercise extensively Recommended to continue current medication Will consult with PCP and review best potential injectable option for patient.  Pre-Diabetes (A1c goal <6.5%) -Controlled -Current medications: None currently -Medications previously tried: none  -Current home glucose readings fasting glucose: not checking post prandial glucose: not checking -Denies hypoglycemic/hyperglycemic symptoms -Current meal patterns:  breakfast: oatmeal, cheerios, occasional eggs lunch: lots of vegetables  dinner:  snacks:  drinks: water -Current exercise: see above -Educated on A1c and blood sugar goals; Exercise goal of 150 minutes per week; Congratulated on recent improvements with A1c -Counseled to check feet daily and get yearly eye exams -Recommended continue current management strategyies  Hypothyroidism (Goal: Maintain  TSH) -Not ideally controlled -Current treatment  Synthroid 112m58maily - except half tablet on Wed and Sunday -Medications previously tried: none noted  -Most recent TSH still slightly below goal.  She reports that she feels tired.  Counseled on proper administration of medication 30 minutes before food or other meds on empty  stomach.  I feel it is best she continues as is for now since it is so close to goal.  Will continue to monitor TSH. -Recommended to continue current medication  Patient Goals/Self-Care Activities Patient will:  - take medications as prescribed focus on medication adherence by pill box check blood pressure 3 times per week, document, and provide at future appointments  Follow Up Plan: The care management team will reach out to the patient again over the next 30 days.      Long-Range Goal: Patient-Specific Goal   Start Date: 03/28/2021  Expected End Date: 09/25/2021  Recent Progress: On track  Priority: High  Note:   Current Barriers:  Has tried and failed multiple BP and cholesterol meds due to side effects; Will need to find most suitable options to get both under control.  Pharmacist Clinical Goal(s):  Patient will contact provider office for questions/concerns as evidenced notation of same in electronic health record through collaboration with PharmD and provider.   Interventions: 1:1 collaboration with Toni Guise, MD regarding development and update of comprehensive plan of care as evidenced by provider attestation and co-signature Inter-disciplinary care team collaboration (see longitudinal plan of care) Comprehensive medication review performed; medication list updated in electronic medical record  Hypertension (BP goal <130/80) -Uncontrolled -Current treatment: Lisinopril 4m daily HCTZ 221mdaily -Medications previously tried: Valsartan, Olmesartan, Amlodipine, Felodipine  -Current home readings: 160s/70s -Current dietary habits: watching  salt intake and drinking plenty of water currently to try and keep BP down -Current exercise habits: Stays active in yard with push mower, and walking at loL-3 CommunicationsDenies hypotensive/hypertensive symptoms -Educated on Daily salt intake goal < 2300 mg; Exercise goal of 150 minutes per week; Importance of home blood pressure monitoring; -Counseled to monitor BP at home once daily, document, and provide log at future appointments -Patient reports she still has a cough and is unsure if this is due to allergies or Lisinopril. Reports feeling like she is having drainage down her throat at night. -Counseled on diet and exercise extensively Recommended to continue current medication Recommended patient switch her allergy medication from zyrtec to either claritin or allegra to see if this alleviates the cough. Will assess in 2-3 weeks  Hyperlipidemia: (LDL goal < 100) -Uncontrolled -Current treatment: Fenofibrate 5440maily Ezetimibe 7m76mily -Medications previously tried: all statins  -Current dietary patterns: see above -Current exercise habits: see above -Educated on Cholesterol goals;  Importance of limiting foods high in cholesterol; Exercise goal of 150 minutes per week; -Stressed low fat diet and focusing on lean meats -Counseled on diet and exercise extensively Recommended to continue current medication Assessed patient finances. Does not currently qualify for Repatha PAP but may for 2023 once she assesses 2022 income. Will follow up once that income is available. Patient open to starting Repatha if she can get assistance.  Patient Goals/Self-Care Activities Patient will:  - take medications as prescribed as evidenced by patient report and record review check blood pressure once daily, document, and provide at future appointments collaborate with provider on medication access solutions target a minimum of 150 minutes of moderate intensity exercise weekly  Follow Up Plan:  Telephone follow up appointment with care management team member scheduled for: 09/17/2020          Medication Assistance: None required.  Patient affirms current coverage meets needs.  Compliance/Adherence/Medication fill history: Care Gaps: Mammogram and Colonoscopy  Star-Rating Drugs: Lisinopril 20mg53mDS 05/16/21  Patient's preferred pharmacy is:  [id number]  [place], [place] [address] [place] [address] Phone: [phone] Fax: [phone]  Uses pill box? Yes Pt endorses 100% compliance  We discussed: Benefits of medication synchronization, packaging and delivery as well as enhanced pharmacist oversight with [employer]. Patient decided to: Continue current medication management strategy  Care Plan and Follow Up Patient Decision:  Patient agrees to Care Plan and Follow-up.  Plan: The care management team will reach out to the patient again over the next 30 days.   Toni [name] Clinical Pharmacist [phone]

## 2021-06-02 ENCOUNTER — Other Ambulatory Visit: Payer: Self-pay

## 2021-06-02 ENCOUNTER — Ambulatory Visit: Payer: PPO | Admitting: Student-PharmD

## 2021-06-02 DIAGNOSIS — E782 Mixed hyperlipidemia: Secondary | ICD-10-CM

## 2021-06-02 DIAGNOSIS — I10 Essential (primary) hypertension: Secondary | ICD-10-CM

## 2021-06-02 NOTE — Patient Instructions (Addendum)
Visit Information   Goals Addressed             This Visit's Progress    Track and Manage My Blood Pressure-Hypertension   On track    Timeframe:  Long-Range Goal Priority:  High Start Date:  12/24/20                           Expected End Date:  06/02/2022                  Follow Up Date 09/17/2020    -Check blood pressure once daily and keep a log -Focus on trying to get 150 minutes of exercise weekly    Why is this important?   You won't feel high blood pressure, but it can still hurt your blood vessels.  High blood pressure can cause heart or kidney problems. It can also cause a stroke.  Making lifestyle changes like losing a little weight or eating less salt will help.  Checking your blood pressure at home and at different times of the day can help to control blood pressure.  If the doctor prescribes medicine remember to take it the way the doctor ordered.  Call the office if you cannot afford the medicine or if there are questions about it.     Notes:        Patient Care Plan: CCM Pharmacy Care Plan     Problem Identified: HTN, Hypothyroidism, HLD, Back Pain, Pre-DM   Priority: High  Onset Date: 12/24/2020  Note:   Current Barriers:  Unable to achieve control of cholesterol   Pharmacist Clinical Goal(s):  Patient will achieve adherence to monitoring guidelines and medication adherence to achieve therapeutic efficacy achieve control of cholesterol as evidenced by lipid panel adhere to plan to optimize therapeutic regimen for lipids as evidenced by report of adherence to recommended medication management changes contact provider office for questions/concerns as evidenced notation of same in electronic health record through collaboration with PharmD and provider.   Interventions: 1:1 collaboration with Lavera Guise, MD regarding development and update of comprehensive plan of care as evidenced by provider attestation and co-signature Inter-disciplinary care team  collaboration (see longitudinal plan of care) Comprehensive medication review performed; medication list updated in electronic medical record  Hypertension (BP goal <140/90) -Controlled -Current treatment: Valsartan/HCTZ 80-12.5mg  daily -Medications previously tried: olmesartan, diltiazem  -Current home readings: 141/71, no logs today -Current dietary habits: see DM -Current exercise habits: very active mowing lawn, swims in backyard pool, walking -Denies hypotensive/hypertensive symptoms -Educated on BP goals and benefits of medications for prevention of heart attack, stroke and kidney damage; Exercise goal of 150 minutes per week; Importance of home blood pressure monitoring; Symptoms of hypotension and importance of maintaining adequate hydration; -Counseled to monitor BP at home 3 times weekly, document, and provide log at future appointments -Recommended to continue current medication -Will f/u on BP readings next month.  Hyperlipidemia: (LDL goal < 100) -Uncontrolled -Current treatment: Zetia 10mg  Fenofibrate 54mg   -Medications previously tried: ALL STATINS (elevated LFTs and cramping)  -Current dietary patterns: see DM -Current exercise habits: see above -Educated on Cholesterol goals;  Importance of limiting foods high in cholesterol; Exercise goal of 150 minutes per week; -She has tried all statins and had elevated LFTs, she has also tried Repatha but recalls high copay.  This was some time ago.  Most recent lipid panel showed increased LDL and TG's.  However, she has not been  on Fenofibrate for about one month due to communication issue with refills.  This explains her elevated TG.  LDL continues to increase despite Zetia.  Only option at this point would be to try Repatha or Praluent.  Will reach out to insurance to see which one is covered -Counseled on diet and exercise extensively Recommended to continue current medication Will consult with PCP and review best  potential injectable option for patient.  Pre-Diabetes (A1c goal <6.5%) -Controlled -Current medications: None currently -Medications previously tried: none  -Current home glucose readings fasting glucose: not checking post prandial glucose: not checking -Denies hypoglycemic/hyperglycemic symptoms -Current meal patterns:  breakfast: oatmeal, cheerios, occasional eggs lunch: lots of vegetables  dinner:  snacks:  drinks: water -Current exercise: see above -Educated on A1c and blood sugar goals; Exercise goal of 150 minutes per week; Congratulated on recent improvements with A1c -Counseled to check feet daily and get yearly eye exams -Recommended continue current management strategyies  Hypothyroidism (Goal: Maintain TSH) -Not ideally controlled -Current treatment  Synthroid 197mcg daily - except half tablet on Wed and Sunday -Medications previously tried: none noted  -Most recent TSH still slightly below goal.  She reports that she feels tired.  Counseled on proper administration of medication 30 minutes before food or other meds on empty stomach.  I feel it is best she continues as is for now since it is so close to goal.  Will continue to monitor TSH. -Recommended to continue current medication  Patient Goals/Self-Care Activities Patient will:  - take medications as prescribed focus on medication adherence by pill box check blood pressure 3 times per week, document, and provide at future appointments  Follow Up Plan: The care management team will reach out to the patient again over the next 30 days.      Long-Range Goal: Patient-Specific Goal   Start Date: 03/28/2021  Expected End Date: 09/25/2021  Recent Progress: On track  Priority: High  Note:   Current Barriers:  Has tried and failed multiple BP and cholesterol meds due to side effects; Will need to find most suitable options to get both under control.  Pharmacist Clinical Goal(s):  Patient will contact provider  office for questions/concerns as evidenced notation of same in electronic health record through collaboration with PharmD and provider.   Interventions: 1:1 collaboration with Lavera Guise, MD regarding development and update of comprehensive plan of care as evidenced by provider attestation and co-signature Inter-disciplinary care team collaboration (see longitudinal plan of care) Comprehensive medication review performed; medication list updated in electronic medical record  Hypertension (BP goal <130/80) -Uncontrolled -Current treatment: Lisinopril 20mg  daily HCTZ 25mg  daily -Medications previously tried: Valsartan, Olmesartan, Amlodipine, Felodipine  -Current home readings: 160s/70s -Current dietary habits: watching salt intake and drinking plenty of water currently to try and keep BP down -Current exercise habits: Stays active in yard with push mower, and walking at [employer] -Denies hypotensive/hypertensive symptoms -Educated on Daily salt intake goal < 2300 mg; Exercise goal of 150 minutes per week; Importance of home blood pressure monitoring; -Counseled to monitor BP at home once daily, document, and provide log at future appointments -Patient reports she still has a cough and is unsure if this is due to allergies or Lisinopril. Reports feeling like she is having drainage down her throat at night. -Counseled on diet and exercise extensively Recommended to continue current medication Recommended patient switch her allergy medication from zyrtec to either claritin or allegra to see if this alleviates the cough. Will assess  in 2-3 weeks  Hyperlipidemia: (LDL goal < 100) -Uncontrolled -Current treatment: Fenofibrate 54mg  daily Ezetimibe 10mg  daily -Medications previously tried: all statins  -Current dietary patterns: see above -Current exercise habits: see above -Educated on Cholesterol goals;  Importance of limiting foods high in cholesterol; Exercise goal of 150 minutes  per week; -Stressed low fat diet and focusing on lean meats -Counseled on diet and exercise extensively Recommended to continue current medication Assessed patient finances. Does not currently qualify for Repatha PAP but may for 2023 once she assesses 2022 income. Will follow up once that income is available. Patient open to starting Repatha if she can get assistance.  Patient Goals/Self-Care Activities Patient will:  - take medications as prescribed as evidenced by patient report and record review check blood pressure once daily, document, and provide at future appointments collaborate with provider on medication access solutions target a minimum of 150 minutes of moderate intensity exercise weekly  Follow Up Plan: Telephone follow up appointment with care management team member scheduled for: 09/17/2020        Patient verbalizes understanding of instructions provided today and agrees to view in [place].  Telephone follow up appointment with pharmacy team member scheduled for: 09/17/2020  Alena Bills, [facility]  Clinical Pharmacist [phone]

## 2021-06-04 DIAGNOSIS — I10 Essential (primary) hypertension: Secondary | ICD-10-CM | POA: Diagnosis not present

## 2021-06-04 DIAGNOSIS — E039 Hypothyroidism, unspecified: Secondary | ICD-10-CM | POA: Diagnosis not present

## 2021-06-04 DIAGNOSIS — E782 Mixed hyperlipidemia: Secondary | ICD-10-CM | POA: Diagnosis not present

## 2021-06-08 ENCOUNTER — Other Ambulatory Visit: Payer: Self-pay | Admitting: Nurse Practitioner

## 2021-06-14 ENCOUNTER — Encounter: Payer: Self-pay | Admitting: Nurse Practitioner

## 2021-06-18 ENCOUNTER — Ambulatory Visit: Payer: Self-pay | Admitting: Student-PharmD

## 2021-06-18 DIAGNOSIS — I10 Essential (primary) hypertension: Secondary | ICD-10-CM

## 2021-06-18 DIAGNOSIS — E782 Mixed hyperlipidemia: Secondary | ICD-10-CM

## 2021-06-18 NOTE — Progress Notes (Signed)
Hypertension (HTN) Review Call   Toni [name], Toni [name] [id number] 89 years, Female  DOB: October 16, 1945  M: [phone]  Hypertension Review  Completed by Charlann Lange on 06/18/2021  Chart Review Is the patient enrolled in RPM with BP Monitor?: No BP #1 reading (last): 177/79 on: 05/16/2021 BP #2 reading: 130/72 on: 03/06/2021 BP #3 reading: 140/78 on: 02/05/2021 Any of the last 3 BP > 140/90 mmHg?: Yes What recent interventions/DTPs have been made by any provider to improve the patient's conditions in the last 3 months?: None since last pharm D visit on 06/02/21 Any recent hospitalizations or ED visits since last visit with CPP?: No  Adherence Review Adherence rates for STAR metric medications:  Lisinopril 20 mg 30 DS 05/16/21 Adherence rates for medications indicated for disease state being reviewed: Lisinopril 20 mg 30 DS 05/16/21 Does the patient have >5 day gap between last estimated fill dates for any of the above medications?: No  Disease State Questions Able to connect with the Patient?: Yes Is the patient monitoring his/her BP?: Yes How often are you checking your BP?: occasionally Home BP Reading #1 (most recent): 134/81 Home BP Reading #2: 181/95 Home BP Reading #3: 161/82 Is the patient having any low BP Readings <90/60?: No Is the patient having any BP readings above >180/100?: No Is the patient's average BP>140/90?: Yes What is your blood pressure goal?: 120/80 Educate patient to inform proper points on checking BP at home:: When taking resting blood pressure: sit quietly for 5 minutes, not within 30 min. of exercising, no talking., Do not drink caffeine or smoke a cigarette at least 30 min. prior to checking., Make sure using the right size cuff, the length of the cuff's bladder should be at least equal to 75% of the circumference of the upper arm., Sit with feet flat on the floor, arm at heart level. What diet changes have you made to improve your Blood Pressure  Control?: eating more home-cooked meals, limiting / monitoring salt intake What exercise are you doing to improve your Blood Pressure Control?: no formal exercise Misc. Response/Information:: Patient stated she thinks her blood pressure medication is causing her cough. She stated she her cough is worse. She stated now her throat is sore, she has to have cough drops consistently. She stated she doesn't need that all the time. She stated she has to take Benadryl just to sleep because she cant stop coughing. Patient stated she does have a follow-up appointment coming up. She stated because she is unable to sleep at night from coughing she is tired during the day.   Pharmacist Review  Adherence gaps identified?: No Drug Therapy Problems identified?: Yes Details: Patient believes her BP medication (Lisinopril) is causing her cough and nothing OTC is helping. Assessment: Uncontrolled Plan: Pt has f/u appt with PCP on 12/23  14 minutes spent in review, coordination, and documentation.  Reviewed by: Beverly Milch, PharmD Clinical Pharmacist [phone]

## 2021-06-19 ENCOUNTER — Ambulatory Visit: Payer: PPO | Admitting: Nurse Practitioner

## 2021-06-23 DIAGNOSIS — E039 Hypothyroidism, unspecified: Secondary | ICD-10-CM | POA: Diagnosis not present

## 2021-06-23 DIAGNOSIS — E870 Hyperosmolality and hypernatremia: Secondary | ICD-10-CM | POA: Diagnosis not present

## 2021-06-24 LAB — SODIUM: Sodium: 141 mmol/L (ref 134–144)

## 2021-06-24 LAB — TSH+FREE T4
Free T4: 1.62 ng/dL (ref 0.82–1.77)
TSH: 0.454 u[IU]/mL (ref 0.450–4.500)

## 2021-06-27 ENCOUNTER — Other Ambulatory Visit: Payer: Self-pay

## 2021-06-27 ENCOUNTER — Ambulatory Visit (INDEPENDENT_AMBULATORY_CARE_PROVIDER_SITE_OTHER): Payer: PPO | Admitting: Nurse Practitioner

## 2021-06-27 ENCOUNTER — Encounter: Payer: Self-pay | Admitting: Nurse Practitioner

## 2021-06-27 ENCOUNTER — Telehealth: Payer: PPO

## 2021-06-27 VITALS — BP 154/67 | HR 68 | Temp 98.2°F | Resp 16 | Ht 66.5 in | Wt 163.2 lb

## 2021-06-27 DIAGNOSIS — E782 Mixed hyperlipidemia: Secondary | ICD-10-CM

## 2021-06-27 DIAGNOSIS — Z23 Encounter for immunization: Secondary | ICD-10-CM | POA: Diagnosis not present

## 2021-06-27 DIAGNOSIS — E119 Type 2 diabetes mellitus without complications: Secondary | ICD-10-CM

## 2021-06-27 DIAGNOSIS — I10 Essential (primary) hypertension: Secondary | ICD-10-CM

## 2021-06-27 DIAGNOSIS — E538 Deficiency of other specified B group vitamins: Secondary | ICD-10-CM | POA: Diagnosis not present

## 2021-06-27 LAB — POCT GLYCOSYLATED HEMOGLOBIN (HGB A1C): Hemoglobin A1C: 6.5 % — AB (ref 4.0–5.6)

## 2021-06-27 MED ORDER — PNEUMOCOCCAL 20-VAL CONJ VACC 0.5 ML IM SUSY: 0.5000 mL | PREFILLED_SYRINGE | Freq: Once | INTRAMUSCULAR | 0 refills | Status: AC

## 2021-06-27 MED ORDER — CYANOCOBALAMIN 1000 MCG/ML IJ SOLN
1000.0000 ug | Freq: Once | INTRAMUSCULAR | Status: AC
  Administered 2021-06-27: 14:00:00 1000 ug via INTRAMUSCULAR

## 2021-06-27 MED ORDER — NIFEDIPINE ER OSMOTIC RELEASE 30 MG PO TB24: 30.0000 mg | ORAL_TABLET | Freq: Every day | ORAL | 1 refills | Status: DC

## 2021-06-27 MED ORDER — FENOFIBRATE 54 MG PO TABS: ORAL_TABLET | ORAL | 1 refills | Status: DC

## 2021-06-27 MED ORDER — EZETIMIBE 10 MG PO TABS: 10.0000 mg | ORAL_TABLET | Freq: Every day | ORAL | 1 refills | Status: DC

## 2021-06-27 NOTE — Progress Notes (Signed)
[facility] [place], [place] [address]  Internal MEDICINE  Office Visit Note  Patient Name: Toni [name]  [id number]  [id number]  Date of Service: 06/27/2021  Chief Complaint  Patient presents with   Follow-up    Discuss meds   Diabetes   Gastroesophageal Reflux   Hyperlipidemia   Hypertension   Results    HPI Toni [name] presents for a follow up visit for diabets and hypertension. Her blood pressure is not well controlled with lisinopril and she has developed a dry cough. Her A1C is due today and it is 6.5 which is slightly improved from September which was 6.6. Na I is normal She has her labs repeated - her sodium was high at 145 but came back down to normal at 141 when repeated on 06/23/21. Her thyroid levels have improved as well and she reports she is feeling better.      Current Medication: Outpatient Encounter Medications as of 06/27/2021  Medication Sig   aspirin 81 MG tablet Take 81 mg by mouth daily.   Calcium Carbonate-Vitamin D (CALCIUM + D PO) Take 1 tablet by mouth daily.    cetirizine (ZYRTEC) 10 MG tablet Take 10 mg by mouth as needed.    cholecalciferol (VITAMIN D) 1000 units tablet Take 1,000 Units by mouth daily.   pantoprazole (PROTONIX) 40 MG tablet Take 1 tablet (40 mg total) by mouth daily. t   [EXPIRED] pneumococcal 20-valent conjugate vaccine (PREVNAR 20) 0.5 ML injection Inject 0.5 mLs into the muscle once for 1 dose.   SYNTHROID 112 MCG tablet Take 1 tablet po QAM   [DISCONTINUED] ezetimibe (ZETIA) 10 MG tablet TAKE 1 TABLET BY MOUTH EVERY DAY   [DISCONTINUED] fenofibrate 54 MG tablet TAKE 1 TABLET BY MOUTH EVERY DAY FOR ELEVATED CHOLESTEROL   [DISCONTINUED] hydrochlorothiazide (HYDRODIURIL) 25 MG tablet Take 1 tablet (25 mg total) by mouth daily.   [DISCONTINUED] lisinopril (ZESTRIL) 20 MG tablet TAKE 1 TABLET BY MOUTH EVERY DAY   [DISCONTINUED] NIFEdipine (PROCARDIA-XL/NIFEDICAL-XL) 30 MG 24 hr tablet Take 1 tablet (30 mg  total) by mouth daily.   [DISCONTINUED] pneumococcal 23 valent vaccine (PNEUMOVAX 23) 25 MCG/0.5ML injection Inject 0.57ml IM once   ezetimibe (ZETIA) 10 MG tablet Take 1 tablet (10 mg total) by mouth daily.   fenofibrate 54 MG tablet TAKE 1 TABLET BY MOUTH EVERY DAY FOR ELEVATED CHOLESTEROL   Facility-Administered Encounter Medications as of 06/27/2021  Medication   0.9 %  sodium chloride infusion   [COMPLETED] cyanocobalamin ((VITAMIN B-12)) injection 1,000 mcg    Surgical History: Past Surgical History:  Procedure Laterality Date   APPENDECTOMY     BUNIONECTOMY     right foot   [place], BILATERAL Left 10/20/2017   CATARACT EXTRACTION, BILATERAL Right 10/27/2017   CHOLECYSTECTOMY     COLONOSCOPY     HAMMER TOE SURGERY  12/2012   left toe   PARTIAL HYSTERECTOMY     POLYPECTOMY     ROBOTIC ASSISTED SALPINGO OOPHERECTOMY     right ovary first removed then left ovary removed years later   TONSILLECTOMY  age 28   TOTAL THYROIDECTOMY  09/2011    Medical History: Past Medical History:  Diagnosis Date   Allergic rhinitis    Allergy    Aortic atherosclerosis (HCC)    Diabetes mellitus, type 2 (HCC)    no medicines- was pre diabtes- diet controlled    Diverticulitis  Essential hypertension    GERD (gastroesophageal reflux disease)    Hyperlipidemia    Hypertension    Hypothyroidism    Obesity    Osteoporosis    Transient insomnia    Tubular adenoma of colon 05/2013    Family History: Family History  Problem Relation Age of Onset   Throat cancer Father    Esophageal cancer Father    Colon cancer Maternal Aunt    Colon polyps Maternal Aunt    Colon cancer Maternal Uncle    Colon polyps Maternal Uncle    Heart disease Maternal Uncle    Stomach cancer Maternal Uncle    Prostate cancer Maternal Uncle    Breast cancer Cousin    Diabetes Neg Hx    Kidney disease Neg Hx    Gallbladder disease Neg Hx     Rectal cancer Neg Hx     Social History   Socioeconomic History   Marital status: Married    Spouse name: Not on file   Number of children: 2   Years of education: Not on file   Highest education level: Not on file  Occupational History   Occupation: Retired  Tobacco Use   Smoking status: Former    Packs/day: 1.00    Years: 30.00    Pack years: 30.00    Types: Cigarettes    Quit date: 07/06/1998    Years since quitting: 23.0   Smokeless tobacco: Never  Substance and Sexual Activity   Alcohol use: No    Alcohol/week: 0.0 standard drinks   Drug use: No   Sexual activity: Not on file  Other Topics Concern   Not on file  Social History Narrative   Not on file   Social Determinants of Health   Financial Resource Strain: Low Risk    Difficulty of Paying Living Expenses: Not hard at all  Food Insecurity: Not on file  Transportation Needs: Not on file  Physical Activity: Not on file  Stress: Not on file  Social Connections: Not on file  Intimate Partner Violence: Not on file      Review of Systems  Constitutional:  Negative for chills, fatigue and unexpected weight change.  HENT:  Negative for congestion, rhinorrhea, sneezing and sore throat.   Eyes:  Negative for redness.  Respiratory:  Negative for cough, chest tightness and shortness of breath.   Cardiovascular:  Negative for chest pain and palpitations.  Gastrointestinal:  Negative for abdominal pain, constipation, diarrhea, nausea and vomiting.  Genitourinary:  Negative for dysuria and frequency.  Musculoskeletal:  Negative for arthralgias, back pain, joint swelling and neck pain.  Skin:  Negative for rash.  Neurological: Negative.  Negative for tremors and numbness.  Hematological:  Negative for adenopathy. Does not bruise/bleed easily.  Psychiatric/Behavioral:  Negative for behavioral problems (Depression), sleep disturbance and suicidal ideas. The patient is not nervous/anxious.    Vital Signs: BP (!) 154/67     Pulse 68    Temp 98.2 F (36.8 C)    Resp 16    Ht 5' 6.5" (1.689 m)    Wt 163 lb 3.2 oz (74 kg)    SpO2 96%    BMI 25.95 kg/m    Physical Exam Constitutional:      General: She is not in acute distress.    Appearance: Normal appearance. She is not ill-appearing.  HENT:     Head: Normocephalic and atraumatic.  Eyes:     Pupils: Pupils are equal, round, and reactive to light.  Cardiovascular:     Rate and Rhythm: Normal rate and regular rhythm.  Pulmonary:     Effort: Pulmonary effort is normal. No respiratory distress.  Neurological:     Mental Status: She is alert and oriented to person, place, and time.     Cranial Nerves: No cranial nerve deficit.     Coordination: Coordination normal.     Gait: Gait normal.  Psychiatric:        Mood and Affect: Mood normal.        Behavior: Behavior normal.       Assessment/Plan: 1. Diet-controlled type 2 diabetes mellitus (HCC) A1C is stable, continue to maintain diet control of diabetes. No changes in interventions need.  - POCT glycosylated hemoglobin (Hb A1C)  2. Essential hypertension Discontinue lisinopril, start nifedipine 30 mg daily (extended release). Follow up in 1 month.   3. Mixed hyperlipidemia Refills ordered.  - fenofibrate 54 MG tablet; TAKE 1 TABLET BY MOUTH EVERY DAY FOR ELEVATED CHOLESTEROL  Dispense: 90 tablet; Refill: 1 - ezetimibe (ZETIA) 10 MG tablet; Take 1 tablet (10 mg total) by mouth daily.  Dispense: 90 tablet; Refill: 1  4. B12 deficiency B12 injection administered in office today.  - cyanocobalamin ((VITAMIN B-12)) injection 1,000 mcg  5. Need for vaccination - pneumococcal 20-valent conjugate vaccine (PREVNAR 20) 0.5 ML injection; Inject 0.5 mLs into the muscle once for 1 dose.  Dispense: 0.5 mL; Refill: 0   General Counseling: Leim Fabry understanding of the findings of todays visit and agrees with plan of treatment. I have discussed any further diagnostic evaluation that may be needed  or ordered today. We also reviewed her medications today. she has been encouraged to call the office with any questions or concerns that should arise related to todays visit.    Orders Placed This Encounter  Procedures   POCT glycosylated hemoglobin (Hb A1C)    Meds ordered this encounter  Medications   fenofibrate 54 MG tablet    Sig: TAKE 1 TABLET BY MOUTH EVERY DAY FOR ELEVATED CHOLESTEROL    Dispense:  90 tablet    Refill:  1   ezetimibe (ZETIA) 10 MG tablet    Sig: Take 1 tablet (10 mg total) by mouth daily.    Dispense:  90 tablet    Refill:  1   cyanocobalamin ((VITAMIN B-12)) injection 1,000 mcg   pneumococcal 20-valent conjugate vaccine (PREVNAR 20) 0.5 ML injection    Sig: Inject 0.5 mLs into the muscle once for 1 dose.    Dispense:  0.5 mL    Refill:  0   DISCONTD: NIFEdipine (PROCARDIA-XL/NIFEDICAL-XL) 30 MG 24 hr tablet    Sig: Take 1 tablet (30 mg total) by mouth daily.    Dispense:  30 tablet    Refill:  1    New med, discontinue lisinopril    Return in about 1 month (around 07/28/2021) for F/U, BP check, Charnell Peplinski PCP.   Total time spent:30 Minutes Time spent includes review of chart, medications, test results, and follow up plan with the patient.   [place] Controlled Substance Database was reviewed by me.  This patient was seen by [name], FNP-C in collaboration with Dr. Clayborn Bigness as a part of collaborative care agreement.   Cerria Randhawa R. Valetta Fuller, MSN, FNP-C Internal medicine

## 2021-07-01 ENCOUNTER — Other Ambulatory Visit: Payer: Self-pay | Admitting: Nurse Practitioner

## 2021-07-01 DIAGNOSIS — I10 Essential (primary) hypertension: Secondary | ICD-10-CM

## 2021-07-04 DIAGNOSIS — I10 Essential (primary) hypertension: Secondary | ICD-10-CM | POA: Diagnosis not present

## 2021-07-04 DIAGNOSIS — E039 Hypothyroidism, unspecified: Secondary | ICD-10-CM | POA: Diagnosis not present

## 2021-07-04 DIAGNOSIS — E782 Mixed hyperlipidemia: Secondary | ICD-10-CM | POA: Diagnosis not present

## 2021-07-11 ENCOUNTER — Encounter: Payer: Self-pay | Admitting: Gastroenterology

## 2021-07-19 ENCOUNTER — Other Ambulatory Visit: Payer: Self-pay | Admitting: Nurse Practitioner

## 2021-07-25 ENCOUNTER — Ambulatory Visit (INDEPENDENT_AMBULATORY_CARE_PROVIDER_SITE_OTHER): Payer: PPO | Admitting: Nurse Practitioner

## 2021-07-25 ENCOUNTER — Encounter: Payer: Self-pay | Admitting: Nurse Practitioner

## 2021-07-25 ENCOUNTER — Other Ambulatory Visit: Payer: Self-pay

## 2021-07-25 VITALS — BP 154/82 | HR 72 | Temp 98.3°F | Resp 16 | Ht 66.5 in | Wt 165.2 lb

## 2021-07-25 DIAGNOSIS — I10 Essential (primary) hypertension: Secondary | ICD-10-CM | POA: Diagnosis not present

## 2021-07-25 DIAGNOSIS — J3089 Other allergic rhinitis: Secondary | ICD-10-CM | POA: Diagnosis not present

## 2021-07-25 DIAGNOSIS — J0141 Acute recurrent pansinusitis: Secondary | ICD-10-CM | POA: Diagnosis not present

## 2021-07-25 MED ORDER — MONTELUKAST SODIUM 10 MG PO TABS: 10.0000 mg | ORAL_TABLET | Freq: Every day | ORAL | 3 refills | Status: DC

## 2021-07-25 MED ORDER — AZITHROMYCIN 250 MG PO TABS: ORAL_TABLET | ORAL | 0 refills | Status: AC

## 2021-07-25 MED ORDER — NIFEDIPINE ER OSMOTIC RELEASE 30 MG PO TB24: 30.0000 mg | ORAL_TABLET | Freq: Two times a day (BID) | ORAL | 1 refills | Status: DC

## 2021-07-25 NOTE — Progress Notes (Signed)
[facility] [place], [place] [address]  Internal MEDICINE  Office Visit Note  Patient Name: Toni [name]  [id number]  [id number]  Date of Service: 07/25/2021  Chief Complaint  Patient presents with   Follow-up   Hypertension   Sinusitis    Drainage, coughing at night    Diabetes   Gastroesophageal Reflux   Hyperlipidemia    HPI Takhia presents for a follow up visit for hypertension and medication review. She is also experiencing residual symptoms of an untreated sinusitis. For her blood pressure, she is currently on nifedipine 30 mg. Her blood pressure has shown slight improvement but is still not optimally controlled.  For the symptoms of sinusitis, she has a cough, post nasal drip, sinus pressure, nasal congestion and sore throat.    Current Medication: Outpatient Encounter Medications as of 07/25/2021  Medication Sig   aspirin 81 MG tablet Take 81 mg by mouth daily.   azithromycin (ZITHROMAX) 250 MG tablet Take 2 tablets on day 1, then 1 tablet daily on days 2 through 5   Calcium Carbonate-Vitamin D (CALCIUM + D PO) Take 1 tablet by mouth daily.    cetirizine (ZYRTEC) 10 MG tablet Take 10 mg by mouth as needed.    cholecalciferol (VITAMIN D) 1000 units tablet Take 1,000 Units by mouth daily.   ezetimibe (ZETIA) 10 MG tablet Take 1 tablet (10 mg total) by mouth daily.   fenofibrate 54 MG tablet TAKE 1 TABLET BY MOUTH EVERY DAY FOR ELEVATED CHOLESTEROL   hydrochlorothiazide (HYDRODIURIL) 25 MG tablet TAKE 1 TABLET (25 MG TOTAL) BY MOUTH DAILY.   montelukast (SINGULAIR) 10 MG tablet Take 1 tablet (10 mg total) by mouth at bedtime.   NIFEdipine (PROCARDIA-XL/NIFEDICAL-XL) 30 MG 24 hr tablet Take 1 tablet (30 mg total) by mouth in the morning and at bedtime.   pantoprazole (PROTONIX) 40 MG tablet Take 1 tablet (40 mg total) by mouth daily. t   SYNTHROID 112 MCG tablet Take 1 tablet po QAM   [DISCONTINUED] NIFEdipine (PROCARDIA-XL/NIFEDICAL-XL) 30 MG  24 hr tablet TAKE 1 TABLET BY MOUTH EVERY DAY   Facility-Administered Encounter Medications as of 07/25/2021  Medication   0.9 %  sodium chloride infusion    Surgical History: Past Surgical History:  Procedure Laterality Date   APPENDECTOMY     BUNIONECTOMY     right foot   [facility]    CATARACT EXTRACTION, BILATERAL Left 10/20/2017   CATARACT EXTRACTION, BILATERAL Right 10/27/2017   CHOLECYSTECTOMY     COLONOSCOPY     HAMMER TOE SURGERY  12/2012   left toe   PARTIAL HYSTERECTOMY     POLYPECTOMY     ROBOTIC ASSISTED SALPINGO OOPHERECTOMY     right ovary first removed then left ovary removed years later   TONSILLECTOMY  age 76   TOTAL THYROIDECTOMY  09/2011    Medical History: Past Medical History:  Diagnosis Date   Allergic rhinitis    Allergy    Aortic atherosclerosis (HCC)    Diabetes mellitus, type 2 (HCC)    no medicines- was pre diabtes- diet controlled    Diverticulitis    Essential hypertension    GERD (gastroesophageal reflux disease)    Hyperlipidemia    Hypertension    Hypothyroidism    Obesity    Osteoporosis    Transient insomnia    Tubular adenoma of colon 05/2013    Family History: Family History  Problem Relation Age of  Onset   Throat cancer Father    Esophageal cancer Father    Colon cancer Maternal Aunt    Colon polyps Maternal Aunt    Colon cancer Maternal Uncle    Colon polyps Maternal Uncle    Heart disease Maternal Uncle    Stomach cancer Maternal Uncle    Prostate cancer Maternal Uncle    Breast cancer Cousin    Diabetes Neg Hx    Kidney disease Neg Hx    Gallbladder disease Neg Hx    Rectal cancer Neg Hx     Social History   Socioeconomic History   Marital status: Married    Spouse name: Not on file   Number of children: 2   Years of education: Not on file   Highest education level: Not on file  Occupational History   Occupation: Retired  Tobacco Use   Smoking status: Former     Packs/day: 1.00    Years: 30.00    Pack years: 30.00    Types: Cigarettes    Quit date: 07/06/1998    Years since quitting: 23.0   Smokeless tobacco: Never  Substance and Sexual Activity   Alcohol use: No    Alcohol/week: 0.0 standard drinks   Drug use: No   Sexual activity: Not on file  Other Topics Concern   Not on file  Social History Narrative   Not on file   Social Determinants of Health   Financial Resource Strain: Low Risk    Difficulty of Paying Living Expenses: Not hard at all  Food Insecurity: Not on file  Transportation Needs: Not on file  Physical Activity: Not on file  Stress: Not on file  Social Connections: Not on file  Intimate Partner Violence: Not on file      Review of Systems  Constitutional:  Negative for chills, fatigue, fever and unexpected weight change.  HENT:  Positive for congestion, postnasal drip, rhinorrhea, sinus pressure, sinus pain and sore throat. Negative for sneezing and trouble swallowing.   Eyes:  Negative for redness.  Respiratory:  Positive for cough. Negative for chest tightness, shortness of breath and wheezing.   Cardiovascular:  Negative for chest pain and palpitations.  Gastrointestinal:  Negative for abdominal pain, constipation, diarrhea, nausea and vomiting.  Genitourinary:  Negative for dysuria and frequency.  Musculoskeletal:  Negative for arthralgias, back pain, joint swelling and neck pain.  Skin:  Negative for rash.  Neurological: Negative.  Negative for tremors and numbness.  Hematological:  Negative for adenopathy. Does not bruise/bleed easily.  Psychiatric/Behavioral:  Negative for behavioral problems (Depression), sleep disturbance and suicidal ideas. The patient is not nervous/anxious.    Vital Signs: BP (!) 154/82 Comment: 160/80   Pulse 72    Temp 98.3 F (36.8 C)    Resp 16    Ht 5' 6.5" (1.689 m)    Wt 165 lb 3.2 oz (74.9 kg)    SpO2 99%    BMI 26.26 kg/m    Physical Exam Vitals reviewed.  Constitutional:       General: She is not in acute distress.    Appearance: Normal appearance. She is normal weight. She is not ill-appearing.  HENT:     Head: Normocephalic and atraumatic.     Right Ear: Tympanic membrane, ear canal and external ear normal.     Left Ear: Tympanic membrane, ear canal and external ear normal.     Nose: Congestion and rhinorrhea present.     Mouth/Throat:  Mouth: Mucous membranes are moist.     Pharynx: Posterior oropharyngeal erythema present. No oropharyngeal exudate.  Eyes:     Pupils: Pupils are equal, round, and reactive to light.  Cardiovascular:     Rate and Rhythm: Normal rate and regular rhythm.     Heart sounds: Normal heart sounds. No murmur heard. Pulmonary:     Effort: Pulmonary effort is normal. No respiratory distress.     Breath sounds: Normal breath sounds. No wheezing.  Neurological:     Mental Status: She is alert.  Psychiatric:        Mood and Affect: Mood normal.        Behavior: Behavior normal.       Assessment/Plan: 1. Essential hypertension Nifedipine dose increased, follow up in 1 month.  - NIFEdipine (PROCARDIA-XL/NIFEDICAL-XL) 30 MG 24 hr tablet; Take 1 tablet (30 mg total) by mouth in the morning and at bedtime.  Dispense: 60 tablet; Refill: 1  2. Acute recurrent pansinusitis Zpack prescribed for empiric antibiotic treatment of sinusitis.  - azithromycin (ZITHROMAX) 250 MG tablet; Take 2 tablets on day 1, then 1 tablet daily on days 2 through 5  Dispense: 6 tablet; Refill: 0  3. Non-seasonal allergic rhinitis due to other allergic trigger Zyrtec has not been helping lately, patient was to try something different, montelukast prescribed.  - montelukast (SINGULAIR) 10 MG tablet; Take 1 tablet (10 mg total) by mouth at bedtime.  Dispense: 30 tablet; Refill: 3   General Counseling: Leim Fabry understanding of the findings of todays visit and agrees with plan of treatment. I have discussed any further diagnostic evaluation  that may be needed or ordered today. We also reviewed her medications today. she has been encouraged to call the office with any questions or concerns that should arise related to todays visit.    No orders of the defined types were placed in this encounter.   Meds ordered this encounter  Medications   montelukast (SINGULAIR) 10 MG tablet    Sig: Take 1 tablet (10 mg total) by mouth at bedtime.    Dispense:  30 tablet    Refill:  3   NIFEdipine (PROCARDIA-XL/NIFEDICAL-XL) 30 MG 24 hr tablet    Sig: Take 1 tablet (30 mg total) by mouth in the morning and at bedtime.    Dispense:  60 tablet    Refill:  1    Please note change from daily to twice daily, discussed with patient, discontinue previous order for nifedipine.   azithromycin (ZITHROMAX) 250 MG tablet    Sig: Take 2 tablets on day 1, then 1 tablet daily on days 2 through 5    Dispense:  6 tablet    Refill:  0    Return in about 6 weeks (around 09/05/2021) for F/U, BP check, Disaya Walt PCP.   Total time spent:30 Minutes Time spent includes review of chart, medications, test results, and follow up plan with the patient.   [place] Controlled Substance Database was reviewed by me.  This patient was seen by [name], FNP-C in collaboration with Dr. Clayborn Bigness as a part of collaborative care agreement.   Maximiano Lott R. Valetta Fuller, MSN, FNP-C Internal medicine

## 2021-07-27 ENCOUNTER — Encounter: Payer: Self-pay | Admitting: Nurse Practitioner

## 2021-07-30 ENCOUNTER — Encounter: Payer: Self-pay | Admitting: Gastroenterology

## 2021-08-13 ENCOUNTER — Telehealth: Payer: Self-pay | Admitting: Student-PharmD

## 2021-08-13 NOTE — Progress Notes (Addendum)
General Review Call   Toni, [name] [id number] 14 years, Female  DOB: September 17, 1945  M: [phone]  General Review [facility]) Completed by Toni [name] on 08/13/2021  Chart Review What recent interventions/DTPs have been made by any provider to improve the patient's conditions in the last 3 months?: Office Visit: 06/27/22 Toni Osgood, NP For diabetes mellitus. STOPPED Lisinopril. STARTED Nifedipine 30 mg daily.  PER NOTE: Pneumococcal 20 Val Conj Vaccine 0.5 ml once. 07/25/21 Toni Osgood, NP.  For hypertension, STARTED Montelukast 10 mg daily at bedtime and Azithromycin 250 mg Take 2 tablets on day 1, then 1 tablet daily on days 2 through 5  CHANGED Nifedipine 30 mg to 2 times daily.  Any recent hospitalizations or ED visits since last visit with CPP?: No  Adherence Review Adherence rates for STAR metric medications: None.  Adherence rates for medications indicated for disease state being reviewed: None.  Does the patient have >5 day gap between last estimated fill dates for any of the above medications?: No  Disease State Questions Able to connect with the Patient?: Yes  Did patient have any problems with their health recently?: No  Did patient have any problems with their pharmacy?: No  Does patient have any issues or side effects with their medications?: No  Additional  information to pass to Patient's CPP?: Yes  Note additional concerns and questions from Patient: Patient stated her blood pressure has not gone down has much as she wants it to be but she stated she is doing okay.  Anything we can do to help take better care of Patient?: No  Engagement Notes Toni [name] on 08/11/2021 03:04 PM [facility] Chart Review: 26min 08/11/21 [facility] Assessment call time spent: 10 min 08/13/21 CP Review: 5 min  Toni [name], [place]  [phone]  Pharmacist Review Adherence gaps identified?: No Drug Therapy Problems identified?: No Assessment:  Controlled  Toni [name] Clinical Pharmacist

## 2021-08-16 ENCOUNTER — Other Ambulatory Visit: Payer: Self-pay | Admitting: Nurse Practitioner

## 2021-08-16 DIAGNOSIS — I10 Essential (primary) hypertension: Secondary | ICD-10-CM

## 2021-08-25 ENCOUNTER — Other Ambulatory Visit: Payer: Self-pay | Admitting: Nurse Practitioner

## 2021-08-25 DIAGNOSIS — I10 Essential (primary) hypertension: Secondary | ICD-10-CM

## 2021-09-02 DIAGNOSIS — E782 Mixed hyperlipidemia: Secondary | ICD-10-CM

## 2021-09-02 DIAGNOSIS — E039 Hypothyroidism, unspecified: Secondary | ICD-10-CM

## 2021-09-02 DIAGNOSIS — I10 Essential (primary) hypertension: Secondary | ICD-10-CM

## 2021-09-04 ENCOUNTER — Other Ambulatory Visit: Payer: Self-pay

## 2021-09-04 ENCOUNTER — Ambulatory Visit (AMBULATORY_SURGERY_CENTER): Payer: PPO | Admitting: *Deleted

## 2021-09-04 VITALS — Ht 66.5 in | Wt 165.0 lb

## 2021-09-04 DIAGNOSIS — Z800 Family history of malignant neoplasm of digestive organs: Secondary | ICD-10-CM

## 2021-09-04 DIAGNOSIS — Z86010 Personal history of colonic polyps: Secondary | ICD-10-CM

## 2021-09-04 MED ORDER — NA SULFATE-K SULFATE-MG SULF 17.5-3.13-1.6 GM/177ML PO SOLN: 1.0000 | ORAL | 0 refills | Status: DC

## 2021-09-04 NOTE — Progress Notes (Signed)
Patient's pre-visit was done today over the phone with the patient. Name,DOB and address verified. Patient denies any allergies to Eggs and Soy. Patient denies any problems with anesthesia/sedation. Patient is not taking any diet pills or blood thinners. No home Oxygen.  ? ?Prep instructions mailed to pt-pt aware. Patient understands to call us back with any questions or concerns. Patient is aware of our care-partner policy and TAVWP-[id number] safety protocol.  ? ?The patient is COVID-19 vaccinated.   ?

## 2021-09-05 ENCOUNTER — Ambulatory Visit (INDEPENDENT_AMBULATORY_CARE_PROVIDER_SITE_OTHER): Payer: PPO | Admitting: Nurse Practitioner

## 2021-09-05 ENCOUNTER — Encounter: Payer: Self-pay | Admitting: Nurse Practitioner

## 2021-09-05 VITALS — BP 144/88 | HR 66 | Temp 98.2°F | Resp 16 | Ht 66.5 in | Wt 167.8 lb

## 2021-09-05 DIAGNOSIS — E119 Type 2 diabetes mellitus without complications: Secondary | ICD-10-CM

## 2021-09-05 DIAGNOSIS — E782 Mixed hyperlipidemia: Secondary | ICD-10-CM

## 2021-09-05 DIAGNOSIS — I10 Essential (primary) hypertension: Secondary | ICD-10-CM | POA: Diagnosis not present

## 2021-09-05 NOTE — Progress Notes (Cosign Needed)
[facility] °[address] °, [place] [address] ° °Internal MEDICINE  °Office Visit Note ° °Patient Name: Halimah W Iiams ° 08/29/1945  °[id number] ° °Date of Service: 09/05/2021 ° °Chief Complaint  °Patient presents with  ° Follow-up  ° Hypertension  ° Diabetes  ° Gastroesophageal Reflux  ° Hyperlipidemia  ° ° °HPI °Rabecka presents for a follow-up visit for ° °Matalyn presents for a follow-up visit for hypertension, diabetes and hyperlipidemia.  At her previous office visit her nifedipine dose was increased from 30 mg daily to 30 mg twice daily but her blood pressure is even more elevated than it was at her previous office visit. °She has tried many blood pressure medications.  She has been on several angiotensin receptor blockers including valsartan, Edarbi and olmesartan.  These medications caused a dry nagging cough as a side effect.  She also tried lisinopril which also caused a dry cough.  She has tried diltiazem in the past, also amlodipine and felodipine and these medications did not adequately control her blood pressure.  A beta-blocker has been considered in the past but was decided against due to her low normal resting heart rate in this mid to low 60s.  She also was on a beta-blocker for migraines in the past and she had symptomatic bradycardia.  She is still taking hydrochlorothiazide 25 mg daily. °Patient reports that her husband sees Dr. Arida for his cardiologist and this is who she would like to see if she needs to be referred. °Her blood pressures have been higher at home as well and she feels like her blood pressure has been more resistant to medication lately.  She also reports that she has been using a lot of salt lately on her food. °Patient has hyperlipidemia.  She is currently taking ezetimibe and fenofibrate and she takes a fish oil supplement as well. °She has diet-controlled diabetes her most recent A1c was 6.5 in December.  ° ° ° ° °Current Medication: °Outpatient Encounter  Medications as of 09/05/2021  °Medication Sig  ° aspirin 81 MG tablet Take 81 mg by mouth daily.  ° Calcium Carbonate-Vitamin D (CALCIUM + D PO) Take 1 tablet by mouth daily.   ° cetirizine (ZYRTEC) 10 MG tablet Take 10 mg by mouth as needed.   ° cholecalciferol (VITAMIN D) 1000 units tablet Take 1,000 Units by mouth daily.  ° ezetimibe (ZETIA) 10 MG tablet Take 1 tablet (10 mg total) by mouth daily.  ° fenofibrate 54 MG tablet fenofibrate 54 mg tablet  ° hydrochlorothiazide (HYDRODIURIL) 25 MG tablet TAKE 1 TABLET (25 MG TOTAL) BY MOUTH DAILY.  ° levothyroxine (SYNTHROID) 112 MCG tablet Synthroid 112 mcg tablet ° TAKE 1 TABLET BY MOUTH DAILY . ON SUNDAYS TAKE 1/2 TABLET  ° Na Sulfate-K Sulfate-Mg Sulf 17.5-3.13-1.6 GM/177ML SOLN Take 1 kit by mouth as directed. May use generic Suprep  ° NIFEdipine (PROCARDIA-XL/NIFEDICAL-XL) 30 MG 24 hr tablet TAKE 1 TABLET BY MOUTH EVERY DAY  ° Omega-3 Fatty Acids (OMEGA 3 PO) Take by mouth.  ° pantoprazole (PROTONIX) 40 MG tablet Take 1 tablet (40 mg total) by mouth daily. t  ° [DISCONTINUED] montelukast (SINGULAIR) 10 MG tablet Take 1 tablet (10 mg total) by mouth at bedtime. (Patient not taking: Reported on 09/05/2021)  ° °No facility-administered encounter medications on file as of 09/05/2021.  ° ° °Surgical History: °Past Surgical History:  °Procedure Laterality Date  ° APPENDECTOMY    ° BUNIONECTOMY    ° right foot  ° CARDIAC   [facility]    CATARACT EXTRACTION, BILATERAL Left 10/20/2017   CATARACT EXTRACTION, BILATERAL Right 10/27/2017   CHOLECYSTECTOMY     COLONOSCOPY  05/25/2016   Dr.Stark   HAMMER TOE SURGERY  12/2012   left toe   PARTIAL HYSTERECTOMY     POLYPECTOMY     ROBOTIC ASSISTED SALPINGO OOPHERECTOMY     right ovary first removed then left ovary removed years later   TONSILLECTOMY  age 61   TOTAL THYROIDECTOMY  09/2011    Medical History: Past Medical History:  Diagnosis Date   Allergic rhinitis    Allergy    Aortic  atherosclerosis (HCC)    Diabetes mellitus, type 2 (HCC)    no medicines- was pre diabtes- diet controlled    Diverticulitis    Essential hypertension    GERD (gastroesophageal reflux disease)    Hyperlipidemia    Hypertension    Hypothyroidism    Obesity    Osteoporosis    Sleep apnea    mild -no cpap   Transient insomnia    Tubular adenoma of colon 05/2013    Family History: Family History  Problem Relation Age of Onset   Throat cancer Father    Esophageal cancer Father    Colon cancer Maternal Aunt    Colon polyps Maternal Aunt    Colon cancer Maternal Uncle    Colon polyps Maternal Uncle    Heart disease Maternal Uncle    Stomach cancer Maternal Uncle    Prostate cancer Maternal Uncle    Breast cancer Cousin    Diabetes Neg Hx    Kidney disease Neg Hx    Gallbladder disease Neg Hx    Rectal cancer Neg Hx     Social History   Socioeconomic History   Marital status: Married    Spouse name: Not on file   Number of children: 2   Years of education: Not on file   Highest education level: Not on file  Occupational History   Occupation: Retired  Tobacco Use   Smoking status: Former    Packs/day: 1.00    Years: 30.00    Pack years: 30.00    Types: Cigarettes    Quit date: 07/06/1998    Years since quitting: 23.1   Smokeless tobacco: Never  Vaping Use   Vaping Use: Never used  Substance and Sexual Activity   Alcohol use: No    Alcohol/week: 0.0 standard drinks   Drug use: No   Sexual activity: Not on file  Other Topics Concern   Not on file  Social History Narrative   Not on file   Social Determinants of Health   Financial Resource Strain: Low Risk    Difficulty of Paying Living Expenses: Not hard at all  Food Insecurity: Not on file  Transportation Needs: Not on file  Physical Activity: Not on file  Stress: Not on file  Social Connections: Not on file  Intimate Partner Violence: Not on file      Review of Systems  Constitutional:  Negative  for chills, fatigue and unexpected weight change.  HENT:  Negative for congestion, rhinorrhea, sneezing and sore throat.   Eyes:  Negative for redness.  Respiratory: Negative.  Negative for cough, chest tightness, shortness of breath and wheezing.   Cardiovascular: Negative.  Negative for chest pain and palpitations.  Gastrointestinal:  Negative for abdominal pain, constipation, diarrhea, nausea and vomiting.  Genitourinary:  Negative for dysuria and frequency.  Musculoskeletal:  Negative for arthralgias, back pain, joint swelling and neck pain.  Skin:  Negative for rash.  Neurological: Negative.  Negative for tremors, numbness and headaches.  Hematological:  Negative for adenopathy. Does not bruise/bleed easily.  Psychiatric/Behavioral:  Negative for behavioral problems (Depression), sleep disturbance and suicidal ideas. The patient is not nervous/anxious.    Vital Signs: BP (!) 144/88 Comment: 176/94   Pulse 66    Temp 98.2 F (36.8 C)    Resp 16    Ht 5' 6.5" (1.689 m)    Wt 167 lb 12.8 oz (76.1 kg)    SpO2 99%    BMI 26.68 kg/m    Physical Exam Vitals reviewed.  Constitutional:      General: She is not in acute distress.    Appearance: Normal appearance. She is not ill-appearing.  HENT:     Head: Normocephalic and atraumatic.  Eyes:     Pupils: Pupils are equal, round, and reactive to light.  Cardiovascular:     Rate and Rhythm: Normal rate and regular rhythm.     Heart sounds: Normal heart sounds. No murmur heard. Pulmonary:     Effort: Pulmonary effort is normal. No respiratory distress.     Breath sounds: Normal breath sounds. No wheezing.  Neurological:     Mental Status: She is alert and oriented to person, place, and time.  Psychiatric:        Mood and Affect: Mood normal.        Behavior: Behavior normal.       Assessment/Plan: 1. Resistant hypertension Blood pressure still not optimally controlled.  No changes made to medications.  Referred to cardiology, Dr.  Fletcher Anon. - Ambulatory referral to Cardiology  2. Diet-controlled type 2 diabetes mellitus ([place]) Continue ADA diet recommendations.  We will repeat A1c at her next office visit.    3. Mixed hyperlipidemia Continue ezetimibe, fenofibrate, and fish oil supplement.  We will repeat lipid panel in 3 to 6 months.   General Counseling: danyia borunda understanding of the findings of todays visit and agrees with plan of treatment. I have discussed any further diagnostic evaluation that may be needed or ordered today. We also reviewed her medications today. she has been encouraged to call the office with any questions or concerns that should arise related to todays visit.    Orders Placed This Encounter  Procedures   Ambulatory referral to Cardiology    No orders of the defined types were placed in this encounter.   Return in about 3 months (around 12/06/2021) for F/U, Derrall Hicks PCP.   Total time spent:30 Minutes Time spent includes review of chart, medications, test results, and follow up plan with the patient.   [place] Controlled Substance Database was reviewed by me.  This patient was seen by [name], FNP-C in collaboration with Dr. Clayborn Bigness as a part of collaborative care agreement.   Ronisha Herringshaw R. Valetta Fuller, MSN, FNP-C Internal medicine

## 2021-09-09 ENCOUNTER — Encounter: Payer: Self-pay | Admitting: Cardiovascular Disease

## 2021-09-09 ENCOUNTER — Other Ambulatory Visit: Payer: Self-pay

## 2021-09-09 ENCOUNTER — Ambulatory Visit: Payer: PPO | Admitting: Cardiovascular Disease

## 2021-09-09 VITALS — BP 152/80 | HR 78 | Ht 66.5 in | Wt 169.0 lb

## 2021-09-09 DIAGNOSIS — R0602 Shortness of breath: Secondary | ICD-10-CM | POA: Diagnosis not present

## 2021-09-09 DIAGNOSIS — E785 Hyperlipidemia, unspecified: Secondary | ICD-10-CM | POA: Diagnosis not present

## 2021-09-09 DIAGNOSIS — I10 Essential (primary) hypertension: Secondary | ICD-10-CM | POA: Diagnosis not present

## 2021-09-09 MED ORDER — CARVEDILOL 6.25 MG PO TABS: 6.2500 mg | ORAL_TABLET | Freq: Two times a day (BID) | ORAL | 1 refills | Status: DC

## 2021-09-09 MED ORDER — CHLORTHALIDONE 25 MG PO TABS: 25.0000 mg | ORAL_TABLET | Freq: Every day | ORAL | 1 refills | Status: DC

## 2021-09-09 NOTE — Patient Instructions (Signed)
Medication Instructions:  ?Your physician has recommended you make the following change in your medication:  ? ?- STOP Hydrochlorothiazide ?- STOP Nifedipine  ? ?- START Chlorthalidone 25 mg daily. An Rx has been sent to your pharmacy ?- START Carvedilol 6.25 mg twice a day. An Rx has been sent to your pharmacy ? ? ?*If you need a refill on your cardiac medications before your next appointment, please call your pharmacy* ? ? ?Lab Work: ?Your physician recommends that you return for lab work (bmp)  in: 1 week ? ?If you have labs (blood work) drawn today and your tests are completely normal, you will receive your results only by: ?MyChart Message (if you have MyChart) OR ?A paper copy in the mail ?If you have any lab test that is abnormal or we need to change your treatment, we will call you to review the results. ? ? ?Testing/Procedures: ?Your physician has requested that you have an echocardiogram. Echocardiography is a painless test that uses sound waves to create images of your heart. It provides your doctor with information about the size and shape of your heart and how well your heart?s chambers and valves are working. This procedure takes approximately one hour. There are no restrictions for this procedure. ? ? ? ?Follow-Up: ?At [facility], you and your health needs are our priority.  As part of our continuing mission to provide you with exceptional heart care, we have created designated Provider Care Teams.  These Care Teams include your primary Cardiologist (physician) and Advanced Practice Providers (APPs -  Physician Assistants and Nurse Practitioners) who all work together to provide you with the care you need, when you need it. ? ?We recommend signing up for the patient portal called "MyChart".  Sign up information is provided on this After Visit Summary.  MyChart is used to connect with patients for Virtual Visits (Telemedicine).  Patients are able to view lab/test results, encounter notes, upcoming  appointments, etc.  Non-urgent messages can be sent to your provider as well.   ?To learn more about what you can do with MyChart, go to [email or web address].   ? ?Your next appointment:   ?4 week(s) ? ?The format for your next appointment:   ?In Person ? ?Provider:   ?You may see Kathlyn Sacramento, MD or one of the following Advanced Practice Providers on your designated Care Team:   ?Murray Hodgkins, NP ?Christell Faith, PA-C ?Cadence Kathlen Mody, PA-C ? ? ?Other Instructions ?N/A ? ?

## 2021-09-09 NOTE — Progress Notes (Signed)
?  ?Cardiology Office Note ? ? ?Date:  09/09/2021  ? ?ID:  Toni [name], DOB 1946-04-09, MRN [id number] ? ?PCP:  [name], NP  ?Cardiologist:   Kathlyn Sacramento, MD  ? ?Chief Complaint  ?Patient presents with  ? New Patient (Initial Visit)  ?  Referred by PCP for HTN. Paitent c.o her legs hurt and swelling in left ankle.  Meds reviewed verbally with patient.   ? ? ?  ?History of Present Illness: ?Toni [name] is a 76 y.o. female who was referred by [name] for evaluation of uncontrolled hypertension.  She has known history of essential hypertension, diet controlled diabetes mellitus and hyperlipidemia. ? ?She was seen by me in 2016 for atypical chest pain. CT scan of the abdomen which showed atherosclerosis of the aorta. Carotid Doppler showed mild intimal thickening with calcified plaque bilaterally. ABI was normal bilaterally. Echocardiogram showed normal LV systolic function with no significant valvular abnormalities.  Lexiscan Myoview showed no evidence of ischemia with normal ejection fraction. ? ?She has prolonged history of hypertension with intolerance to multiple medications including ACE inhibitor's and ARB's due to dry cough.  She tried diltiazem and amlodipine in the past but this did not adequately control her blood pressure.  She was on a beta-blocker for migraines that cause bradycardia.  She was placed on nifedipine but now complains of increased swelling on the left leg.  She has been on hydrochlorothiazide for a long time and tolerated the medication well.  She reports that her blood pressure became uncontrolled over the last year.  She has mild exertional dyspnea and complains of fatigue.  No chest pain.  She does have mild sleep apnea that does not require CPAP.  No use of NSAIDs. ? ? ? ? ?Past Medical History:  ?Diagnosis Date  ? Allergic rhinitis   ? Allergy   ? Aortic atherosclerosis ([place])   ? Diabetes mellitus, type 2 ([place])   ? no medicines- was pre diabtes- diet  controlled   ? Diverticulitis   ? Essential hypertension   ? GERD (gastroesophageal reflux disease)   ? Hyperlipidemia   ? Hypertension   ? Hypothyroidism   ? Obesity   ? Osteoporosis   ? Sleep apnea   ? mild -no cpap  ? Transient insomnia   ? Tubular adenoma of colon 05/2013  ? ? ?Past Surgical History:  ?Procedure Laterality Date  ? APPENDECTOMY    ? BUNIONECTOMY    ? right foot  ? CARDIAC CATHETERIZATION  1994  ? [facility]   ? CATARACT EXTRACTION, BILATERAL Left 10/20/2017  ? CATARACT EXTRACTION, BILATERAL Right 10/27/2017  ? CHOLECYSTECTOMY    ? COLONOSCOPY  05/25/2016  ? Dr.Stark  ? HAMMER TOE SURGERY  12/2012  ? left toe  ? PARTIAL HYSTERECTOMY    ? POLYPECTOMY    ? ROBOTIC ASSISTED SALPINGO OOPHERECTOMY    ? right ovary first removed then left ovary removed years later  ? TONSILLECTOMY  age 20  ? TOTAL THYROIDECTOMY  09/2011  ? ? ? ?Current Outpatient Medications  ?Medication Sig Dispense Refill  ? aspirin 81 MG tablet Take 81 mg by mouth daily.    ? Calcium Carbonate-Vitamin D (CALCIUM + D PO) Take 1 tablet by mouth daily.     ? cetirizine (ZYRTEC) 10 MG tablet Take 10 mg by mouth as needed.     ? cholecalciferol (VITAMIN D) 1000 units tablet Take 1,000 Units by mouth daily.    ? ezetimibe (ZETIA) 10  MG tablet Take 1 tablet (10 mg total) by mouth daily. 90 tablet 1  ? fenofibrate 54 MG tablet fenofibrate 54 mg tablet    ? hydrochlorothiazide (HYDRODIURIL) 25 MG tablet TAKE 1 TABLET (25 MG TOTAL) BY MOUTH DAILY. 90 tablet 1  ? levothyroxine (SYNTHROID) 112 MCG tablet Synthroid 112 mcg tablet ? TAKE 1 TABLET BY MOUTH DAILY . ON SUNDAYS TAKE 1/2 TABLET    ? Na Sulfate-K Sulfate-Mg Sulf 17.5-3.13-1.6 GM/177ML SOLN Take 1 kit by mouth as directed. May use generic Suprep 354 mL 0  ? NIFEdipine (PROCARDIA-XL/NIFEDICAL-XL) 30 MG 24 hr tablet TAKE 1 TABLET BY MOUTH EVERY DAY 90 tablet 1  ? Omega-3 Fatty Acids (OMEGA 3 PO) Take by mouth.    ? pantoprazole (PROTONIX) 40 MG tablet Take 1 tablet (40 mg total) by mouth  daily. t 90 tablet 3  ? ?No current facility-administered medications for this visit.  ? ? ?Allergies:   Cefaclor, Penicillins, and Talwin [pentazocine]  ? ? ?Social History:  The patient  reports that she quit smoking about 23 years ago. Her smoking use included cigarettes. She has a 30.00 pack-year smoking history. She has never used smokeless tobacco. She reports that she does not drink alcohol and does not use drugs.  ? ?Family History:  The patient's family history includes Breast cancer in her cousin; Colon cancer in her maternal aunt and maternal uncle; Colon polyps in her maternal aunt and maternal uncle; Esophageal cancer in her father; Heart disease in her maternal uncle; Prostate cancer in her maternal uncle; Stomach cancer in her maternal uncle; Throat cancer in her father.  ? ? ?ROS:  Please see the history of present illness.   Otherwise, review of systems are positive for none.   All other systems are reviewed and negative.  ? ? ?PHYSICAL EXAM: ?VS:  BP (!) 152/80 (BP Location: Right Arm, Patient Position: Sitting, Cuff Size: Normal)   Pulse 78   Ht 5' 6.5" (1.689 m)   Wt 169 lb (76.7 kg)   SpO2 97%   BMI 26.87 kg/m?  , BMI Body mass index is 26.87 kg/m?. ?GEN: Well nourished, well developed, in no acute distress  ?HEENT: normal  ?Neck: no JVD, carotid bruits, or masses ?Cardiac: RRR; no rubs, or gallops, 2 out of 6 systolic murmur in the aortic area.  Mild left leg edema. ?Respiratory:  clear to auscultation bilaterally, normal work of breathing ?GI: soft, nontender, nondistended, + BS ?MS: no deformity or atrophy  ?Skin: warm and dry, no rash ?Neuro:  Strength and sensation are intact ?Psych: euthymic mood, full affect ?Vascular: Distal pulses are normal. ? ?EKG:  EKG is ordered today. ?The ekg ordered today demonstrates normal sinus rhythm with no significant ST or T wave changes. ? ? ?Recent Labs: ?04/04/2021: ALT 32; BUN 12; Creatinine, Ser 0.90; Hemoglobin 15.3; Platelets 234; Potassium  3.9 ?06/23/2021: Sodium 141; TSH 0.454  ? ? ?Lipid Panel ?   ?Component Value Date/Time  ? CHOL 264 (H) 04/04/2021 0844  ? TRIG 199 (H) 04/04/2021 0844  ? HDL 48 04/04/2021 0844  ? CHOLHDL 5.5 (H) 04/04/2021 0844  ? [place] 179 (H) 04/04/2021 0844  ? ?  ? ?Wt Readings from Last 3 Encounters:  ?09/09/21 169 lb (76.7 kg)  ?09/05/21 167 lb 12.8 oz (76.1 kg)  ?09/04/21 165 lb (74.8 kg)  ?  ? ? ?PAD Screen 09/09/2021  ?Previous PAD dx? No  ?Previous surgical procedure? No  ?Pain with walking? Yes  ?Subsides with rest?  No  ?Feet/toe relief with dangling? No  ?Painful, non-healing ulcers? No  ?Extremities discolored? No  ? ? ? ? ?ASSESSMENT AND PLAN: ? ?1.  Hypertension, likely essential hypertension but we might have to exclude secondary hypertension given that her blood pressure became uncontrolled in the last year.  She has intolerance to multiple medications including ACE inhibitors and ARB's due to cough.  Calcium channel blockers seem to be associated with lower extremity edema.  I elected to stop nifedipine for that reason.  Also I decided to switch hydrochlorothiazide to chlorthalidone 25 mg once daily which is more potent.  Check basic [place] profile in 1 week.  I also added carvedilol 6.25 mg twice daily.  Hopefully this will not be associated with significant bradycardia.  If blood pressure remains uncontrolled, will obtain renal artery duplex. ? ?2.  Fatigue and shortness of breath: She has a cardiac murmur suggestive of aortic sclerosis.  I requested an echocardiogram. ? ?3.  Hyperlipidemia: Most recent lipid profile showed an LDL of 179.  She is currently on fenofibrate and ezetimibe.  Her triglyceride was 199.  There is no proven cardiovascular benefit for fenofibrate and thus we will consider alternatives upon follow-up.  We should consider a small dose statin such as rosuvastatin to see if she can tolerate. ? ? ? ? ?Disposition:   FU with me in 1 month ? ?Signed, ? ?Kathlyn Sacramento, MD  ?09/09/2021 1:34 PM     ?[place] ?

## 2021-09-15 ENCOUNTER — Encounter: Payer: Self-pay | Admitting: Gastroenterology

## 2021-09-17 ENCOUNTER — Other Ambulatory Visit (INDEPENDENT_AMBULATORY_CARE_PROVIDER_SITE_OTHER): Payer: PPO

## 2021-09-17 ENCOUNTER — Other Ambulatory Visit: Payer: Self-pay

## 2021-09-17 ENCOUNTER — Telehealth: Payer: PPO

## 2021-09-17 DIAGNOSIS — I10 Essential (primary) hypertension: Secondary | ICD-10-CM | POA: Diagnosis not present

## 2021-09-18 ENCOUNTER — Ambulatory Visit (AMBULATORY_SURGERY_CENTER): Payer: PPO | Admitting: Gastroenterology

## 2021-09-18 ENCOUNTER — Encounter: Payer: Self-pay | Admitting: Gastroenterology

## 2021-09-18 ENCOUNTER — Other Ambulatory Visit: Payer: Self-pay | Admitting: Gastroenterology

## 2021-09-18 VITALS — BP 162/67 | HR 54 | Temp 96.8°F | Resp 18 | Ht 66.5 in | Wt 165.0 lb

## 2021-09-18 DIAGNOSIS — G4733 Obstructive sleep apnea (adult) (pediatric): Secondary | ICD-10-CM | POA: Diagnosis not present

## 2021-09-18 DIAGNOSIS — D120 Benign neoplasm of cecum: Secondary | ICD-10-CM

## 2021-09-18 DIAGNOSIS — Z86010 Personal history of colonic polyps: Secondary | ICD-10-CM | POA: Diagnosis not present

## 2021-09-18 DIAGNOSIS — Z800 Family history of malignant neoplasm of digestive organs: Secondary | ICD-10-CM

## 2021-09-18 DIAGNOSIS — I10 Essential (primary) hypertension: Secondary | ICD-10-CM | POA: Diagnosis not present

## 2021-09-18 LAB — BASIC METABOLIC PANEL
BUN/Creatinine Ratio: 16 (ref 12–28)
BUN: 14 mg/dL (ref 8–27)
CO2: 25 mmol/L (ref 20–29)
Calcium: 9.2 mg/dL (ref 8.7–10.3)
Chloride: 103 mmol/L (ref 96–106)
Creatinine, Ser: 0.86 mg/dL (ref 0.57–1.00)
Glucose: 162 mg/dL — ABNORMAL HIGH (ref 70–99)
Potassium: 3.9 mmol/L (ref 3.5–5.2)
Sodium: 143 mmol/L (ref 134–144)
eGFR: 71 mL/min/{1.73_m2} (ref >59)

## 2021-09-18 MED ORDER — SODIUM CHLORIDE 0.9 % IV SOLN: 500.0000 mL | Freq: Once | INTRAVENOUS | Status: DC

## 2021-09-18 NOTE — Progress Notes (Signed)
Called to room to assist during endoscopic procedure.  Patient ID and intended procedure confirmed with present staff. Received instructions for my participation in the procedure from the performing physician.  

## 2021-09-18 NOTE — Progress Notes (Signed)
VS-CW  Pt's states no medical or surgical changes since previsit or office visit.  

## 2021-09-18 NOTE — Progress Notes (Signed)
See 09/09/2021 H&P and below note.  ? ?History & Physical ? ?Primary Care Physician:  [name], NP ?Primary Gastroenterologist: Lucio Edward, MD ? ?CHIEF COMPLAINT:  Personal history of colon polyps, [facility]  ? ?HPI: Toni [name] is a 76 y.o. female with a personal history of adenomatous colon polyps and a family history colon cancer, multiple second-degree relatives for colonoscopy.   ? ? ?Past Medical History:  ?Diagnosis Date  ? Allergic rhinitis   ? Allergy   ? Aortic atherosclerosis ([place])   ? Diabetes mellitus, type 2 ([place])   ? no medicines- was pre diabtes- diet controlled   ? Diverticulitis   ? Essential hypertension   ? GERD (gastroesophageal reflux disease)   ? Hyperlipidemia   ? Hypertension   ? Hypothyroidism   ? Obesity   ? Osteoporosis   ? Sleep apnea   ? mild -no cpap  ? Transient insomnia   ? Tubular adenoma of colon 05/2013  ? ? ?Past Surgical History:  ?Procedure Laterality Date  ? APPENDECTOMY    ? BUNIONECTOMY    ? right foot  ? CARDIAC CATHETERIZATION  1994  ? [facility]   ? CATARACT EXTRACTION, BILATERAL Left 10/20/2017  ? CATARACT EXTRACTION, BILATERAL Right 10/27/2017  ? CHOLECYSTECTOMY    ? COLONOSCOPY  05/25/2016  ? Dr.Reynard Christoffersen  ? HAMMER TOE SURGERY  12/2012  ? left toe  ? PARTIAL HYSTERECTOMY    ? POLYPECTOMY    ? ROBOTIC ASSISTED SALPINGO OOPHERECTOMY    ? right ovary first removed then left ovary removed years later  ? TONSILLECTOMY  age 72  ? TOTAL THYROIDECTOMY  09/2011  ? ? ?Prior to Admission medications   ?Medication Sig Start Date End Date Taking? Authorizing Provider  ?aspirin 81 MG tablet Take 81 mg by mouth daily.   Yes [provider]  ?Calcium Carbonate-Vitamin D (CALCIUM + D PO) Take 1 tablet by mouth daily.    Yes [provider]  ?carvedilol (COREG) 6.25 MG tablet Take 1 tablet (6.25 mg total) by mouth 2 (two) times daily. 09/09/21  Yes Wellington Hampshire, MD  ?cetirizine (ZYRTEC) 10 MG tablet Take 10 mg by mouth as needed.    Yes [provider]  ?chlorthalidone (HYGROTON) 25 MG tablet Take 1 tablet (25 mg total) by mouth daily. 09/09/21 12/08/21 Yes Wellington Hampshire, MD  ?cholecalciferol (VITAMIN D) 1000 units tablet Take 1,000 Units by mouth daily.   Yes [provider]  ?ezetimibe (ZETIA) 10 MG tablet Take 1 tablet (10 mg total) by mouth daily. 06/27/21  Yes [name], NP  ?fenofibrate 54 MG tablet fenofibrate 54 mg tablet 07/31/16  Yes [provider]  ?levothyroxine (SYNTHROID) 112 MCG tablet Synthroid 112 mcg tablet ? TAKE 1 TABLET BY MOUTH DAILY . ON SUNDAYS TAKE 1/2 TABLET 02/24/19  Yes [provider]  ?Omega-3 Fatty Acids (OMEGA 3 PO) Take by mouth.   Yes [provider]  ?pantoprazole (PROTONIX) 40 MG tablet Take 1 tablet (40 mg total) by mouth daily. t 07/29/20  Yes Lavera Guise, MD  ? ? ?Current Outpatient Medications  ?Medication Sig Dispense Refill  ? aspirin 81 MG tablet Take 81 mg by mouth daily.    ? Calcium Carbonate-Vitamin D (CALCIUM + D PO) Take 1 tablet by mouth daily.     ? carvedilol (COREG) 6.25 MG tablet Take 1 tablet (6.25 mg total) by mouth 2 (two) times daily. 180 tablet 1  ? cetirizine (ZYRTEC) 10 MG tablet  Take 10 mg by mouth as needed.     ? chlorthalidone (HYGROTON) 25 MG tablet Take 1 tablet (25 mg total) by mouth daily. 90 tablet 1  ? cholecalciferol (VITAMIN D) 1000 units tablet Take 1,000 Units by mouth daily.    ? ezetimibe (ZETIA) 10 MG tablet Take 1 tablet (10 mg total) by mouth daily. 90 tablet 1  ? fenofibrate 54 MG tablet fenofibrate 54 mg tablet    ? levothyroxine (SYNTHROID) 112 MCG tablet Synthroid 112 mcg tablet ? TAKE 1 TABLET BY MOUTH DAILY . ON SUNDAYS TAKE 1/2 TABLET    ? Omega-3 Fatty Acids (OMEGA 3 PO) Take by mouth.    ? pantoprazole (PROTONIX) 40 MG tablet Take 1 tablet (40 mg total) by mouth daily. t 90 tablet 3  ? ?Current Facility-Administered Medications  ?Medication Dose Route Frequency Provider Last Rate Last Admin  ? 0.9 %  sodium chloride infusion  500  mL Intravenous Once Ladene Artist, MD      ? ? ?Allergies as of 09/18/2021 - Review Complete 09/18/2021  ?Allergen Reaction Noted  ? Cefaclor  05/06/2010  ? Penicillins  05/06/2010  ? Talwin [pentazocine]  05/13/2016  ? ? ?Family History  ?Problem Relation Age of Onset  ? Throat cancer Father   ? Esophageal cancer Father   ? Colon cancer Maternal Aunt   ? Colon polyps Maternal Aunt   ? Colon cancer Maternal Uncle   ? Colon polyps Maternal Uncle   ? Heart disease Maternal Uncle   ? Stomach cancer Maternal Uncle   ? Prostate cancer Maternal Uncle   ? Breast cancer Cousin   ? Diabetes Neg Hx   ? Kidney disease Neg Hx   ? Gallbladder disease Neg Hx   ? Rectal cancer Neg Hx   ? ? ?Social History  ? ?Socioeconomic History  ? Marital status: Married  ?  Spouse name: Not on file  ? Number of children: 2  ? Years of education: Not on file  ? Highest education level: Not on file  ?Occupational History  ? Occupation: Retired  ?Tobacco Use  ? Smoking status: Former  ?  Packs/day: 1.00  ?  Years: 30.00  ?  Pack years: 30.00  ?  Types: Cigarettes  ?  Quit date: 07/06/1998  ?  Years since quitting: 23.2  ? Smokeless tobacco: Never  ?Vaping Use  ? Vaping Use: Never used  ?Substance and Sexual Activity  ? Alcohol use: No  ?  Alcohol/week: 0.0 standard drinks  ? Drug use: No  ? Sexual activity: Not on file  ?Other Topics Concern  ? Not on file  ?Social History Narrative  ? Not on file  ? ?Social Determinants of Health  ? ?Financial Resource Strain: Low Risk   ? Difficulty of Paying Living Expenses: Not hard at all  ?Food Insecurity: Not on file  ?Transportation Needs: Not on file  ?Physical Activity: Not on file  ?Stress: Not on file  ?Social Connections: Not on file  ?Intimate Partner Violence: Not on file  ? ? ?Review of Systems: ? ?All systems reviewed an negative except where noted in HPI. ? ?Gen: Denies any fever, chills, sweats, anorexia, fatigue, weakness, malaise, weight loss, and sleep disorder ?CV: Denies chest pain,  angina, palpitations, syncope, orthopnea, PND, peripheral edema, and claudication. ?Resp: Denies dyspnea at rest, dyspnea with exercise, cough, sputum, wheezing, coughing up blood, and pleurisy. ?GI: Denies vomiting blood, jaundice, and fecal incontinence.   Denies dysphagia or odynophagia. ?  GU : Denies urinary burning, blood in urine, urinary frequency, urinary hesitancy, nocturnal urination, and urinary incontinence. ?MS: Denies joint pain, limitation of movement, and swelling, stiffness, low back pain, extremity pain. Denies muscle weakness, cramps, atrophy.  ?Derm: Denies rash, itching, dry skin, hives, moles, warts, or unhealing ulcers.  ?Psych: Denies depression, anxiety, memory loss, suicidal ideation, hallucinations, paranoia, and confusion. ?Heme: Denies bruising, bleeding, and enlarged lymph nodes. ?Neuro:  Denies any headaches, dizziness, paresthesias. ?Endo:  Denies any problems with DM, thyroid, adrenal function. ? ? ?Physical Exam: ?General:  Alert, well-developed, in NAD ?Head:  Normocephalic and atraumatic. ?Eyes:  Sclera clear, no icterus.   Conjunctiva pink. ?Ears:  Normal auditory acuity. ?Mouth:  No deformity or lesions.  ?Neck:  Supple; no masses . ?Lungs:  Clear throughout to auscultation.   No wheezes, crackles, or rhonchi. No acute distress. ?Heart:  Regular rate and rhythm; no murmurs. ?Abdomen:  Soft, nondistended, nontender. No masses, hepatomegaly. No obvious masses.  Normal bowel .    ?Rectal:  Deferred   ?Msk:  Symmetrical without gross deformities.Marland Kitchen ?Pulses:  Normal pulses noted. ?Extremities:  Without edema. ?Neurologic:  Alert and  oriented x4;  grossly normal neurologically. ?Skin:  Intact without significant lesions or rashes. ?Cervical Nodes:  No significant cervical adenopathy. ?Psych:  Alert and cooperative. Normal mood and affect. ? ? ?Impression / Plan:  ? ?Personal history of adenomatous colon polyps and a family history colon cancer, multiple second-degree relatives for  colonoscopy.  ? ?Harshan Kearley T. Fuller Plan  09/18/2021, 9:13 AM ?See Shea Evans, [facility], to contact our on call provider ? ? ?  ?

## 2021-09-18 NOTE — Progress Notes (Signed)
Report to PACU, RN, vss, BBS= Clear.  

## 2021-09-18 NOTE — Patient Instructions (Signed)
Discharge instructions given. ?Handouts on polyps,diverticulosis and hemorrhoids. ?Resume previous medications. ?YOU HAD AN ENDOSCOPIC PROCEDURE TODAY AT [facility]:   Refer to the procedure report that was given to you for any specific questions about what was found during the examination.  If the procedure report does not answer your questions, please call your gastroenterologist to clarify.  If you requested that your care partner not be given the details of your procedure findings, then the procedure report has been included in a sealed envelope for you to review at your convenience later. ? ?YOU SHOULD EXPECT: Some feelings of bloating in the abdomen. Passage of more gas than usual.  Walking can help get rid of the air that was put into your GI tract during the procedure and reduce the bloating. If you had a lower endoscopy (such as a colonoscopy or flexible sigmoidoscopy) you may notice spotting of blood in your stool or on the toilet paper. If you underwent a bowel prep for your procedure, you may not have a normal bowel movement for a few days. ? ?Please Note:  You might notice some irritation and congestion in your nose or some drainage.  This is from the oxygen used during your procedure.  There is no need for concern and it should clear up in a day or so. ? ?SYMPTOMS TO REPORT IMMEDIATELY: ? ?Following lower endoscopy (colonoscopy or flexible sigmoidoscopy): ? Excessive amounts of blood in the stool ? Significant tenderness or worsening of abdominal pains ? Swelling of the abdomen that is new, acute ? Fever of 100?F or higher ? ? ?For urgent or emergent issues, a gastroenterologist can be reached at any hour by calling [phone]. ?Do not use MyChart messaging for urgent concerns.  ? ? ?DIET:  We do recommend a small meal at first, but then you may proceed to your regular diet.  Drink plenty of fluids but you should avoid alcoholic beverages for 24 hours. ? ?ACTIVITY:  You should  plan to take it easy for the rest of today and you should NOT DRIVE or use heavy machinery until tomorrow (because of the sedation medicines used during the test).   ? ?FOLLOW UP: ?Our staff will call the number listed on your records 48-72 hours following your procedure to check on you and address any questions or concerns that you may have regarding the information given to you following your procedure. If we do not reach you, we will leave a message.  We will attempt to reach you two times.  During this call, we will ask if you have developed any symptoms of COVID 19. If you develop any symptoms (ie: fever, flu-like symptoms, shortness of breath, cough etc.) before then, please call [phone].  If you test positive for Covid 19 in the 2 weeks post procedure, please call and report this information to [place].   ? ?If any biopsies were taken you will be contacted by phone or by letter within the next 1-3 weeks.  Please call us at [phone] if you have not heard about the biopsies in 3 weeks.  ? ? ?SIGNATURES/CONFIDENTIALITY: ?You and/or your care partner have signed paperwork which will be entered into your electronic medical record.  These signatures attest to the fact that that the information above on your After Visit Summary has been reviewed and is understood.  Full responsibility of the confidentiality of this discharge information lies with you and/or your care-partner.  ? ?

## 2021-09-18 NOTE — Progress Notes (Signed)
Approx 0932 pt started wretching/gulping,  Sedation halted.  Oropharynx suctioned,  minimal to nothing in cannister. Some clear fluid on pillow but not much.  Sats never dropped ?

## 2021-09-18 NOTE — Op Note (Signed)
[place] ?Patient Name: Toni [name] ?Procedure Date: 09/18/2021 9:04 AM ?MRN: [id number] ?Endoscopist: Ladene Artist , MD ?Age: 76 ?Referring MD:  ?Date of Birth: [birth year] ?Gender: Female ?Account #: [id number] ?Procedure:                Colonoscopy ?Indications:              Surveillance: Personal history of adenomatous  ?                          polyps on last colonoscopy > 5 years ago, Family  ?                          history of colon cancer, multiple second degree  ?                          relatives. ?Medicines:                Monitored Anesthesia Care ?Procedure:                Pre-Anesthesia Assessment: ?                          - Prior to the procedure, a History and Physical  ?                          was performed, and patient medications and  ?                          allergies were reviewed. The patient's tolerance of  ?                          previous anesthesia was also reviewed. The risks  ?                          and benefits of the procedure and the sedation  ?                          options and risks were discussed with the patient.  ?                          All questions were answered, and informed consent  ?                          was obtained. Prior Anticoagulants: The patient has  ?                          taken no previous anticoagulant or antiplatelet  ?                          agents. ASA Grade Assessment: II - A patient with  ?                          mild systemic disease. After reviewing the risks  ?  and benefits, the patient was deemed in  ?                          satisfactory condition to undergo the procedure. ?                          After obtaining informed consent, the colonoscope  ?                          was passed under direct vision. Throughout the  ?                          procedure, the patient's blood pressure, pulse, and  ?                          oxygen saturations were monitored continuously. The  ?                           Olympus PCF-H190DL ([id number]) Colonoscope was  ?                          introduced through the anus and advanced to the the  ?                          cecum, identified by appendiceal orifice and  ?                          ileocecal valve. The ileocecal valve, appendiceal  ?                          orifice, and rectum were photographed. The quality  ?                          of the bowel preparation was good. The colonoscopy  ?                          was performed without difficulty. The patient  ?                          tolerated the procedure well. ?Scope In: 9:22:57 AM ?Scope Out: 9:38:14 AM ?Scope Withdrawal Time: 0 hours 11 minutes 7 seconds  ?Total Procedure Duration: 0 hours 15 minutes 17 seconds  ?Findings:                 External hemorrhoids and skin tags were found on  ?                          perianal exam. ?                          A 6 mm polyp was found in the cecum. The polyp was  ?                          sessile. The polyp was removed with a cold snare.  ?  Resection and retrieval were complete. ?                          A few small-mouthed diverticula were found in the  ?                          sigmoid colon and transverse colon. There was no  ?                          evidence of diverticular bleeding. ?                          External hemorrhoids were found during  ?                          retroflexion. The hemorrhoids were moderate. ?                          The exam was otherwise without abnormality on  ?                          direct and retroflexion views. ?Complications:            No immediate complications. Estimated blood loss:  ?                          None. ?Estimated Blood Loss:     Estimated blood loss: none. ?Impression:               - External hemorrhoids and skin tags found on  ?                          perianal exam. ?                          - One 6 mm polyp in the cecum, removed with a cold  ?                           snare. Resected and retrieved. ?                          - Mild diverticulosis. ?                          - External hemorrhoids. ?                          - The examination was otherwise normal on direct  ?                          and retroflexion views. ?Recommendation:           - Repeat colonoscopy, likely 5 years, after studies  ?                          are complete for surveillance based on pathology  ?  results. ?                          - Patient has a contact number available for  ?                          emergencies. The signs and symptoms of potential  ?                          delayed complications were discussed with the  ?                          patient. Return to normal activities tomorrow.  ?                          Written discharge instructions were provided to the  ?                          patient. ?                          - Resume previous diet. ?                          - Continue present medications. ?                          - Await pathology results. ?Ladene Artist, MD ?09/18/2021 9:44:07 AM ?This report has been signed electronically. ?

## 2021-09-22 ENCOUNTER — Telehealth: Payer: Self-pay

## 2021-09-22 NOTE — Telephone Encounter (Signed)
?  Follow up Call- ? ?Call back number 09/18/2021  ?Post procedure Call Back phone  # [phone]  ?Permission to leave phone message Yes  ?Some recent data might be hidden  ?  ? ?Patient questions: ? ?Do you have a fever, pain , or abdominal swelling? No. ?Pain Score  0 * ? ?Have you tolerated food without any problems? Yes.   ? ?Have you been able to return to your normal activities? Yes.   ? ?Do you have any questions about your discharge instructions: ?Diet   No. ?Medications  No. ?Follow up visit  No. ? ?Do you have questions or concerns about your Care? No. ? ?Actions: ?* If pain score is 4 or above: ?No action needed, pain <4. ? ?Have you developed a fever since your procedure? no ? ?2.   Have you had an respiratory symptoms (SOB or cough) since your procedure? no ? ?3.   Have you tested positive for COVID 19 since your procedure no ? ?4.   Have you had any family members/close contacts diagnosed with the COVID 19 since your procedure?  no ? ? ?If yes to any of these questions please route to Joylene John, RN and Joella Prince, RN ? ? ? ?

## 2021-09-30 ENCOUNTER — Other Ambulatory Visit: Payer: Self-pay

## 2021-09-30 ENCOUNTER — Ambulatory Visit (INDEPENDENT_AMBULATORY_CARE_PROVIDER_SITE_OTHER): Payer: PPO

## 2021-09-30 DIAGNOSIS — R0602 Shortness of breath: Secondary | ICD-10-CM | POA: Diagnosis not present

## 2021-09-30 LAB — ECHOCARDIOGRAM COMPLETE
AR max vel: 3.03 cm2
AV Area VTI: 2.97 cm2
AV Area mean vel: 2.92 cm2
AV Mean grad: 3 mmHg
AV Peak grad: 5.3 mmHg
Ao pk vel: 1.15 m/s
Area-P 1/2: 3.42 cm2
Calc EF: 66.9 %
S' Lateral: 3.2 cm
Single Plane A2C EF: 73.3 %
Single Plane A4C EF: 63.8 %

## 2021-10-06 ENCOUNTER — Encounter: Payer: Self-pay | Admitting: Gastroenterology

## 2021-10-06 NOTE — Progress Notes (Signed)
? ?Cardiology Office Note   ? ?Date:  10/07/2021  ? ?ID:  Toni [name], DOB 05/10/1946, MRN [id number] ? ?PCP:  [name], NP  ?Cardiologist:  Kathlyn Sacramento, MD  ?Electrophysiologist:  None  ? ?Chief Complaint: Follow-up ? ?History of Present Illness:  ? ?Toni [name] is a 76 y.o. female with history of aortic atherosclerosis, DM 2, HTN, HLD, hypothyroidism, obesity, and mild sleep apnea that does not require CPAP usage who presents for follow-up of HTN. ? ?She was previously evaluated by Dr. Fletcher Anon in 2016 for atypical chest pain.  CT of the abdomen showed aortic atherosclerosis.  Carotid Doppler showed mild intimal thickening with calcified plaque bilaterally.  ABI was normal bilaterally.  Echo showed normal LV systolic function with no significant valvular abnormalities.  Lexiscan MPI showed no evidence of ischemia with a normal EF and was overall low risk.  She was referred back to Dr. Fletcher Anon in 09/2021 for evaluation of hypertension with intolerance to multiple medications including ACE inhibitor and ARB, due to dry cough.  It was noted she had previously been on amlodipine and diltiazem, though these did not adequately control her blood pressure.  She had previously been on beta-blocker for migraines, though this led to bradycardia.  She was subsequently placed on nifedipine, though noted increased lower extremity swelling involving the left leg with this.  She had been on HCTZ for quite a while and was tolerating this medication.  She reported her blood pressure became uncontrolled over the preceding year.  There was some mild exertional dyspnea and fatigue.  She was without chest pain.  Blood pressure in the office was elevated at 152/80.  Elevated BP was felt to likely be essential in etiology.  Given lower extremity swelling, nifedipine was stopped, HCTZ was transitioned to chlorthalidone, and carvedilol was added.  Given fatigue and dyspnea, as well as murmur suggestive of aortic sclerosis on  exam, she underwent echo on 09/30/2021 which demonstrated an EF of 60 to 65%, no regional wall motion abnormalities, grade 1 diastolic dysfunction, normal RV systolic function and ventricular cavity size, mild mitral regurgitation, and an estimated right atrial pressure of 3 mmHg. ? ?She comes in doing well from a cardiac perspective.  Since initiating carvedilol and transitioning HCTZ to chlorthalidone she has continued to note elevated BP readings typically in the 176H to 607P systolic.  However, since undergoing these medication changes she has only had 1 systolic blood pressure reading greater than 200 mmHg.  She is without symptoms of angina or decompensation.  She does note some increase in fatigue following the initiation of carvedilol.  She indicates she is now taking a nap in the afternoon.  Following discontinuation of nifedipine she has noted resolution of lower extremity swelling.  No dizziness, presyncope, or syncope. ? ? ?Labs independently reviewed: ?09/2021 - BUN 14, serum creatinine 0.86, potassium 3.9 ?06/2021 - A1c 6.5, TSH normal ?03/2021 - TC 264, TG 199, HDL 48, LDL 179, albumin 4.4, AST/ALT normal, Hgb 15.3, PLT 234 ? ?Past Medical History:  ?Diagnosis Date  ? Allergic rhinitis   ? Allergy   ? Aortic atherosclerosis ([place])   ? Diabetes mellitus, type 2 ([place])   ? no medicines- was pre diabtes- diet controlled   ? Diverticulitis   ? Essential hypertension   ? GERD (gastroesophageal reflux disease)   ? Hyperlipidemia   ? Hypertension   ? Hypothyroidism   ? Obesity   ? Osteoporosis   ? Sleep apnea   ? mild -  no cpap  ? Transient insomnia   ? Tubular adenoma of colon 05/2013  ? ? ?Past Surgical History:  ?Procedure Laterality Date  ? APPENDECTOMY    ? BUNIONECTOMY    ? right foot  ? CARDIAC CATHETERIZATION  1994  ? [facility]   ? CATARACT EXTRACTION, BILATERAL Left 10/20/2017  ? CATARACT EXTRACTION, BILATERAL Right 10/27/2017  ? CHOLECYSTECTOMY    ? COLONOSCOPY  05/25/2016  ? Dr.Stark  ? COLONOSCOPY     ? March 2023  ? HAMMER TOE SURGERY  12/2012  ? left toe  ? PARTIAL HYSTERECTOMY    ? POLYPECTOMY    ? ROBOTIC ASSISTED SALPINGO OOPHERECTOMY    ? right ovary first removed then left ovary removed years later  ? TONSILLECTOMY  age 83  ? TOTAL THYROIDECTOMY  09/2011  ? ? ?Current Medications: ?Current Meds  ?Medication Sig  ? aspirin 81 MG tablet Take 81 mg by mouth daily.  ? Calcium Carbonate-Vitamin D (CALCIUM + D PO) Take 1 tablet by mouth daily.   ? carvedilol (COREG) 12.5 MG tablet Take 1 tablet (12.5 mg total) by mouth 2 (two) times daily.  ? cetirizine (ZYRTEC) 10 MG tablet Take 10 mg by mouth as needed.   ? chlorthalidone (HYGROTON) 25 MG tablet Take 1 tablet (25 mg total) by mouth daily.  ? cholecalciferol (VITAMIN D) 1000 units tablet Take 1,000 Units by mouth daily.  ? ezetimibe (ZETIA) 10 MG tablet Take 1 tablet (10 mg total) by mouth daily.  ? fenofibrate 54 MG tablet fenofibrate 54 mg tablet  ? levothyroxine (SYNTHROID) 112 MCG tablet Synthroid 112 mcg tablet ? TAKE 1 TABLET BY MOUTH DAILY . ON SUNDAYS TAKE 1/2 TABLET  ? Omega-3 Fatty Acids (OMEGA 3 PO) Take by mouth.  ? pantoprazole (PROTONIX) 40 MG tablet Take 1 tablet (40 mg total) by mouth daily. t  ? [DISCONTINUED] carvedilol (COREG) 6.25 MG tablet Take 1 tablet (6.25 mg total) by mouth 2 (two) times daily.  ? ? ?Allergies:   Cefaclor, Penicillins, and Talwin [pentazocine]  ? ?Social History  ? ?Socioeconomic History  ? Marital status: Married  ?  Spouse name: Not on file  ? Number of children: 2  ? Years of education: Not on file  ? Highest education level: Not on file  ?Occupational History  ? Occupation: Retired  ?Tobacco Use  ? Smoking status: Former  ?  Packs/day: 1.00  ?  Years: 30.00  ?  Pack years: 30.00  ?  Types: Cigarettes  ?  Quit date: 07/06/1998  ?  Years since quitting: 23.2  ? Smokeless tobacco: Never  ?Vaping Use  ? Vaping Use: Never used  ?Substance and Sexual Activity  ? Alcohol use: No  ?  Alcohol/week: 0.0 standard drinks  ? Drug  use: No  ? Sexual activity: Not on file  ?Other Topics Concern  ? Not on file  ?Social History Narrative  ? Not on file  ? ?Social Determinants of Health  ? ?Financial Resource Strain: Low Risk   ? Difficulty of Paying Living Expenses: Not hard at all  ?Food Insecurity: Not on file  ?Transportation Needs: Not on file  ?Physical Activity: Not on file  ?Stress: Not on file  ?Social Connections: Not on file  ?  ? ?Family History:  ?The patient's family history includes Breast cancer in her cousin; Colon cancer in her maternal aunt and maternal uncle; Colon polyps in her maternal aunt and maternal uncle; Esophageal cancer in her father; Heart  disease in her maternal uncle; Prostate cancer in her maternal uncle; Stomach cancer in her maternal uncle; Throat cancer in her father. There is no history of Diabetes, Kidney disease, Gallbladder disease, or Rectal cancer. ? ?ROS:   ?12-point review systems is negative as otherwise noted in the HPI. ? ? ?EKGs/Labs/Other Studies Reviewed:   ? ?Studies reviewed were summarized above. The additional studies were reviewed today: ? ?2D echo 09/30/2021: ?1. Left ventricular ejection fraction, by estimation, is 60 to 65%. The  ?left ventricle has normal function. The left ventricle has no regional  ?wall motion abnormalities. Left ventricular diastolic parameters are  ?consistent with Grade I diastolic  ?dysfunction (impaired relaxation).  ? 2. Right ventricular systolic function is normal. The right ventricular  ?size is normal.  ? 3. The mitral valve is normal in structure. Mild mitral valve  ?regurgitation. No evidence of mitral stenosis.  ? 4. The aortic valve is tricuspid. Aortic valve regurgitation is not  ?visualized. No aortic stenosis is present.  ? 5. The inferior vena cava is normal in size with greater than 50%  ?respiratory variability, suggesting right atrial pressure of 3 mmHg. ?__________ ? ?Lexiscan MPI 12/19/2014: ?Pharmacological myocardial perfusison imaging study with  no significnat ischemia. ?The left ventricular ejection fraction is hyperdynamic (>65%). No wall motion abnormality noted. ?No EKG changes concerning for ischemia. ?This is a low risk study. ? ? ?EK

## 2021-10-07 ENCOUNTER — Ambulatory Visit: Payer: PPO | Admitting: Physician Assistant

## 2021-10-07 ENCOUNTER — Encounter: Payer: Self-pay | Admitting: Physician Assistant

## 2021-10-07 VITALS — BP 150/80 | HR 78 | Ht 66.5 in | Wt 167.0 lb

## 2021-10-07 DIAGNOSIS — I700 Atherosclerosis of aorta: Secondary | ICD-10-CM

## 2021-10-07 DIAGNOSIS — E785 Hyperlipidemia, unspecified: Secondary | ICD-10-CM

## 2021-10-07 DIAGNOSIS — I10 Essential (primary) hypertension: Secondary | ICD-10-CM

## 2021-10-07 MED ORDER — CARVEDILOL 12.5 MG PO TABS: 12.5000 mg | ORAL_TABLET | Freq: Two times a day (BID) | ORAL | 3 refills | Status: DC

## 2021-10-07 NOTE — Patient Instructions (Addendum)
Medication Instructions:  ?Your physician has recommended you make the following change in your medication:  ? ?INCREASE Carvedilol 12.5 mg twice a day ? ?*If you need a refill on your cardiac medications before your next appointment, please call your pharmacy* ? ? ?Lab Work: ?Nothing to eat or drink after midnight and go first time in the morning.  ? ?Go to [facility] entrance and check in at registration.  ? ?[facility] Mall Entrance at [facility] ?1st desk on the right to check in, past the screening table ?Lab hours: Monday- Friday (7:30 am- 5:30 pm) ? ?Renin Aldosterone Ratio ?Plasma metanephrines ? ?If you have labs (blood work) drawn today and your tests are completely normal, you will receive your results only by: ?MyChart Message (if you have MyChart) OR ?A paper copy in the mail ?If you have any lab test that is abnormal or we need to change your treatment, we will call you to review the results. ? ? ?Testing/Procedures: ?Your physician has requested that you have a renal artery duplex. During this test, an ultrasound is used to evaluate blood flow to the kidneys. Allow one hour for this exam. Do not eat after midnight the day before and avoid carbonated beverages. Take your medications as you usually do. ? ? ? ?Follow-Up: ?At [facility], you and your health needs are our priority.  As part of our continuing mission to provide you with exceptional heart care, we have created designated Provider Care Teams.  These Care Teams include your primary Cardiologist (physician) and Advanced Practice Providers (APPs -  Physician Assistants and Nurse Practitioners) who all work together to provide you with the care you need, when you need it. ? ?We recommend signing up for the patient portal called "MyChart".  Sign up information is provided on this After Visit Summary.  MyChart is used to connect with patients for Virtual Visits (Telemedicine).  Patients are able to view lab/test results, encounter notes, upcoming  appointments, etc.  Non-urgent messages can be sent to your provider as well.   ?To learn more about what you can do with MyChart, go to [email or web address].   ? ?Your next appointment:   ?1 month(s) ? ?The format for your next appointment:   ?In Person ? ?Provider:   ?Kathlyn Sacramento, MD or Christell Faith, PA-C ?

## 2021-10-10 ENCOUNTER — Other Ambulatory Visit
Admission: RE | Admit: 2021-10-10 | Discharge: 2021-10-10 | Disposition: A | Discharge status: Home / Self Care | Payer: PPO | Attending: Physician Assistant | Admitting: Physician Assistant

## 2021-10-10 ENCOUNTER — Ambulatory Visit (INDEPENDENT_AMBULATORY_CARE_PROVIDER_SITE_OTHER): Payer: PPO

## 2021-10-10 DIAGNOSIS — E785 Hyperlipidemia, unspecified: Secondary | ICD-10-CM

## 2021-10-10 DIAGNOSIS — I10 Essential (primary) hypertension: Secondary | ICD-10-CM | POA: Diagnosis not present

## 2021-10-15 ENCOUNTER — Encounter: Payer: Self-pay | Admitting: *Deleted

## 2021-10-15 ENCOUNTER — Encounter: Payer: Self-pay | Admitting: Nurse Practitioner

## 2021-10-15 ENCOUNTER — Telehealth (INDEPENDENT_AMBULATORY_CARE_PROVIDER_SITE_OTHER): Payer: PPO | Admitting: Nurse Practitioner

## 2021-10-15 VITALS — Resp 16 | Ht 66.5 in

## 2021-10-15 DIAGNOSIS — Z20822 Contact with and (suspected) exposure to covid-19: Secondary | ICD-10-CM | POA: Diagnosis not present

## 2021-10-15 DIAGNOSIS — R051 Acute cough: Secondary | ICD-10-CM

## 2021-10-15 DIAGNOSIS — J0110 Acute frontal sinusitis, unspecified: Secondary | ICD-10-CM

## 2021-10-15 DIAGNOSIS — Z03818 Encounter for observation for suspected exposure to other biological agents ruled out: Secondary | ICD-10-CM | POA: Diagnosis not present

## 2021-10-15 DIAGNOSIS — Z006 Encounter for examination for normal comparison and control in clinical research program: Secondary | ICD-10-CM

## 2021-10-15 MED ORDER — DOXYCYCLINE HYCLATE 100 MG PO TABS: 100.0000 mg | ORAL_TABLET | Freq: Two times a day (BID) | ORAL | 0 refills | Status: AC

## 2021-10-15 MED ORDER — HYDROCOD POLI-CHLORPHE POLI ER 10-8 MG/5ML PO SUER: 5.0000 mL | Freq: Two times a day (BID) | ORAL | 0 refills | Status: DC | PRN

## 2021-10-15 NOTE — Progress Notes (Signed)
[facility] ?[address] ?[place], [place] [address] ? ?Internal MEDICINE  ?Telephone Visit ? ?Patient Name: Toni [name] ? [id number]  ?[id number] ? ?Date of Service: 10/15/2021 ? ?I connected with the patient at 12:05 PM by telephone and verified the patients identity using two identifiers.   ?I discussed the limitations, risks, security and privacy concerns of performing an evaluation and management service by telephone and the availability of in person appointments. I also discussed with the patient that there may be a patient responsible charge related to the service.  The patient expressed understanding and agrees to proceed.   ? ?Chief Complaint  ?Patient presents with  ? Telephone Screen  ? Telephone Assessment  ?  [phone], will do a phone call with this number  ? Cough  ?  Coughing up green phlegm, cough hurts ribs and chest, especially at night - NEGATIVE Covid  ? Sore Throat  ? Headache  ? ? ?HPI ?Zella presents for a telehealth virtual visit for symptoms of an upper respiratory infection. Negative for covid today. Coughing up green sputum, sore throat, headache, chest tightness, cough worse at night.  Nasal congestion is present along with sinus drainage and pressure and fatigue. ? ? ?Current Medication: ?Outpatient Encounter Medications as of 10/15/2021  ?Medication Sig  ? aspirin 81 MG tablet Take 81 mg by mouth daily.  ? Calcium Carbonate-Vitamin D (CALCIUM + D PO) Take 1 tablet by mouth daily.   ? carvedilol (COREG) 12.5 MG tablet Take 1 tablet (12.5 mg total) by mouth 2 (two) times daily.  ? cetirizine (ZYRTEC) 10 MG tablet Take 10 mg by mouth as needed.   ? chlorpheniramine-HYDROcodone (TUSSIONEX PENNKINETIC ER) 10-8 MG/5ML Take 5 mLs by mouth every 12 (twelve) hours as needed for cough.  ? chlorthalidone (HYGROTON) 25 MG tablet Take 1 tablet (25 mg total) by mouth daily.  ? cholecalciferol (VITAMIN D) 1000 units tablet Take 1,000 Units by mouth daily.  ? doxycycline (VIBRA-TABS)  100 MG tablet Take 1 tablet (100 mg total) by mouth 2 (two) times daily for 10 days. Take with food.  ? ezetimibe (ZETIA) 10 MG tablet Take 1 tablet (10 mg total) by mouth daily.  ? fenofibrate 54 MG tablet fenofibrate 54 mg tablet  ? levothyroxine (SYNTHROID) 112 MCG tablet Synthroid 112 mcg tablet ? TAKE 1 TABLET BY MOUTH DAILY . ON SUNDAYS TAKE 1/2 TABLET  ? Omega-3 Fatty Acids (OMEGA 3 PO) Take by mouth.  ? pantoprazole (PROTONIX) 40 MG tablet Take 1 tablet (40 mg total) by mouth daily. t  ? ?No facility-administered encounter medications on file as of 10/15/2021.  ? ? ?Surgical History: ?Past Surgical History:  ?Procedure Laterality Date  ? APPENDECTOMY    ? BUNIONECTOMY    ? right foot  ? CARDIAC CAT[facility]TERIZATION  1994  ? [facility]   ? CATARACT EXTRACTION, BILATERAL Left 10/20/2017  ? CATARACT EXTRACTION, BILATERAL Right 10/27/2017  ? CHOLECYSTECTOMY    ? COLONOSCOPY  05/25/2016  ? Dr.Stark  ? COLONOSCOPY    ? March 2023  ? HAMMER TOE SURGERY  12/2012  ? left toe  ? PARTIAL HYSTERECTOMY    ? POLYPECTOMY    ? ROBOTIC ASSISTED SALPINGO OOPHERECTOMY    ? right ovary first removed then left ovary removed years later  ? TONSILLECTOMY  age 40  ? TOTAL THYROIDECTOMY  09/2011  ? ? ?Medical History: ?Past Medical History:  ?Diagnosis Date  ? Allergic rhinitis   ? Allergy   ? Aortic  atherosclerosis ([place])   ? Diabetes mellitus, type 2 ([place])   ? no medicines- was pre diabtes- diet controlled   ? Diverticulitis   ? Essential hypertension   ? GERD (gastroesophageal reflux disease)   ? Hyperlipidemia   ? Hypertension   ? Hypothyroidism   ? Obesity   ? Osteoporosis   ? Sleep apnea   ? mild -no cpap  ? Transient insomnia   ? Tubular adenoma of colon 05/2013  ? ? ?Family History: ?Family History  ?Problem Relation Age of Onset  ? Throat cancer Father   ? Esophageal cancer Father   ? Colon cancer Maternal Aunt   ? Colon polyps Maternal Aunt   ? Colon cancer Maternal Uncle   ? Colon polyps Maternal Uncle   ? Heart disease  Maternal Uncle   ? Stomach cancer Maternal Uncle   ? Prostate cancer Maternal Uncle   ? Breast cancer Cousin   ? Diabetes Neg Hx   ? Kidney disease Neg Hx   ? Gallbladder disease Neg Hx   ? Rectal cancer Neg Hx   ? ? ?Social History  ? ?Socioeconomic History  ? Marital status: Married  ?  Spouse name: Not on file  ? Number of children: 2  ? Years of education: Not on file  ? Highest education level: Not on file  ?Occupational History  ? Occupation: Retired  ?Tobacco Use  ? Smoking status: Former  ?  Packs/day: 1.00  ?  Years: 30.00  ?  Pack years: 30.00  ?  Types: Cigarettes  ?  Quit date: 07/06/1998  ?  Years since quitting: 23.2  ? Smokeless tobacco: Never  ?Vaping Use  ? Vaping Use: Never used  ?Substance and Sexual Activity  ? Alcohol use: No  ?  Alcohol/week: 0.0 standard drinks  ? Drug use: No  ? Sexual activity: Not on file  ?Other Topics Concern  ? Not on file  ?Social History Narrative  ? Not on file  ? ?Social Determinants of Health  ? ?Financial Resource Strain: Low Risk   ? Difficulty of Paying Living Expenses: Not hard at all  ?Food Insecurity: Not on file  ?Transportation Needs: Not on file  ?Physical Activity: Not on file  ?Stress: Not on file  ?Social Connections: Not on file  ?Intimate Partner Violence: Not on file  ? ? ? ? ?Review of Systems  ?Constitutional:  Positive for fatigue. Negative for chills and fever.  ?HENT:  Positive for congestion, postnasal drip, sinus pressure, sinus pain and sore throat.   ?Respiratory:  Positive for cough and chest tightness. Negative for shortness of breath and wheezing.   ?Cardiovascular: Negative.  Negative for chest pain and palpitations.  ?Neurological:  Positive for headaches.  ? ?Vital Signs: ?Resp 16   Ht 5' 6.5" (1.689 m)   BMI 26.55 kg/m?  ? ? ?Observation/Objective: ?She is alert and oriented and engages in conversation appropriately.  She does not sound as though she is in any acute distress over telephone call ? ? ? ?Assessment/Plan: ?1. Acute  non-recurrent frontal sinusitis ?Empiric antibiotic treatment prescribed. ?- doxycycline (VIBRA-TABS) 100 MG tablet; Take 1 tablet (100 mg total) by mouth 2 (two) times daily for 10 days. Take with food.  Dispense: 20 tablet; Refill: 0 ? ?2. Acute cough ?Medication prescribed for symptomatic relief of cough ?- chlorpheniramine-HYDROcodone (TUSSIONEX PENNKINETIC ER) 10-8 MG/5ML; Take 5 mLs by mouth every 12 (twelve) hours as needed for cough.  Dispense: 140 mL; Refill: 0 ? ? ?  General Counseling: kyanna mahrt understanding of the findings of today's phone visit and agrees with plan of treatment. I have discussed any further diagnostic evaluation that may be needed or ordered today. We also reviewed her medications today. she has been encouraged to call the office with any questions or concerns that should arise related to todays visit. ? ?Return if symptoms worsen or fail to improve. ? ? ?No orders of the defined types were placed in this encounter. ? ? ?Meds ordered this encounter  ?Medications  ? doxycycline (VIBRA-TABS) 100 MG tablet  ?  Sig: Take 1 tablet (100 mg total) by mouth 2 (two) times daily for 10 days. Take with food.  ?  Dispense:  20 tablet  ?  Refill:  0  ? chlorpheniramine-HYDROcodone (TUSSIONEX PENNKINETIC ER) 10-8 MG/5ML  ?  Sig: Take 5 mLs by mouth every 12 (twelve) hours as needed for cough.  ?  Dispense:  140 mL  ?  Refill:  0  ? ? ?Time spent:10 Minutes ?Time spent with patient included reviewing progress notes, labs, imaging studies, and discussing plan for follow up.  ?[place] Controlled Substance Database was reviewed by me for overdose risk score (ORS) if appropriate. ? ?This patient was seen by [name], FNP-C in collaboration with Dr. Clayborn Bigness as a part of collaborative care agreement. ? ?Vang Kraeger R. Valetta Fuller, MSN, FNP-C ?Internal medicine  ?

## 2021-10-15 NOTE — Research (Signed)
Spoke with Ms Conerly about [employer] study. Mailed her the essence consent to review. Encouraged her to call with any questions. ?

## 2021-10-16 LAB — METANEPHRINES, PLASMA
Metanephrine, Free: 27.7 pg/mL (ref 0.0–88.0)
Normetanephrine, Free: 82.1 pg/mL (ref 0.0–285.2)

## 2021-10-16 LAB — ALDOSTERONE + RENIN ACTIVITY W/ RATIO
ALDO / PRA Ratio: 22.2 (ref 0.0–30.0)
Aldosterone: 14.6 ng/dL (ref 0.0–30.0)
PRA LC/MS/MS: 0.657 ng/mL/hr (ref 0.167–5.380)

## 2021-10-28 ENCOUNTER — Encounter: Payer: Self-pay | Admitting: *Deleted

## 2021-10-28 DIAGNOSIS — Z006 Encounter for examination for normal comparison and control in clinical research program: Secondary | ICD-10-CM

## 2021-10-28 NOTE — Research (Signed)
I called patient about the Essence Study to lower triglycerides.We had emailed the consent to the patient and was following up with her to see if she had any questions I could answer. I left message for patient to call me back. ?

## 2021-11-03 NOTE — Progress Notes (Signed)
? ?Cardiology Office Note   ? ?Date:  11/06/2021  ? ?ID:  Toni [name], DOB [birth year], MRN [id number] ? ?PCP:  [name], NP  ?Cardiologist:  Kathlyn Sacramento, MD  ?Electrophysiologist:  None  ? ?Chief Complaint: Follow up ? ?History of Present Illness:  ? ?Toni [name] is a 76 y.o. female with history of aortic atherosclerosis, [id number], HTN, HLD, hypothyroidism, obesity, and mild sleep apnea that does not require CPAP usage who presents for follow-up of HTN. ?  ?She was previously evaluated by Dr. Fletcher Anon in 2016 for atypical chest pain.  CT of the abdomen showed aortic atherosclerosis.  Carotid Doppler showed mild intimal thickening with calcified plaque bilaterally.  ABI was normal bilaterally.  Echo showed normal LV systolic function with no significant valvular abnormalities.  Lexiscan MPI showed no evidence of ischemia with a normal EF and was overall low risk.  She was referred back to Dr. Fletcher Anon in 09/2021 for evaluation of hypertension with intolerance to multiple medications including ACE inhibitor and ARB, due to dry cough.  It was noted she had previously been on amlodipine and diltiazem, though these did not adequately control her blood pressure.  She had previously been on beta-blocker for migraines, though this led to bradycardia.  She was subsequently placed on nifedipine, though noted increased lower extremity swelling involving the left leg with this.  She had been on HCTZ for quite a while and was tolerating this medication.  She reported her blood pressure became uncontrolled over the preceding year.  There was some mild exertional dyspnea and fatigue.  She was without chest pain.  Blood pressure in the office was elevated at 152/80.  Elevated BP was felt to likely be essential in etiology.  Given lower extremity swelling, nifedipine was stopped, HCTZ was transitioned to chlorthalidone, and carvedilol was added.  Given fatigue and dyspnea, as well as murmur suggestive of aortic sclerosis  on exam, she underwent echo on 09/30/2021 which demonstrated an EF of 60 to 65%, no regional wall motion abnormalities, grade 1 diastolic dysfunction, normal RV systolic function and ventricular cavity size, mild mitral regurgitation, and an estimated right atrial pressure of 3 mmHg.  She was seen in follow up on 10/07/2021 and was doing well.  Since initiating carvedilol and transitioning HCTZ to chlorthalidone, she continued to note elevated BP readings typically in the 254Y to 706C systolic.  However, since undergoing these medication changes she had only had 1 systolic blood pressure reading greater than 200 mmHg.  She did note some increase in fatigue following the initiation of carvedilol.  Following the discontinuation of nifedipine she noted resolution of lower extremity swelling.  Coreg was titrated to 12.5 mg bid and she was continued on chlorthalidone 25 mg.  Renal artery ultrasound showed no evidence of RAS bilaterally.  Rennin aldosterone and plasma metanephrines were normal.   ? ?She comes in doing well today from a cardiac perspective and is without symptoms of angina or decompensation.  With titration of carvedilol, she has continued to note improvement in BP readings at home.  Most readings at home are in the 376E systolic.  She is very pleased with her reading of 138/78 today.  She does note some fatigue following titration of carvedilol, though feels like she is adjusting to this.  She remains active at home and is able to push mow the lawn without angina.  She does take a break while mowing the lawn.  No dizziness, presyncope, or syncope.  No lower extremity  swelling. ? ? ?Labs independently reviewed: ?09/2021 - BUN 14, serum creatinine 0.86, potassium 3.9 ?06/2021 - A1c 6.5, TSH normal ?03/2021 - TC 264, TG 199, HDL 48, LDL 179, albumin 4.4, AST/ALT normal, Hgb 15.3, PLT 234 ? ?Past Medical History:  ?Diagnosis Date  ? Allergic rhinitis   ? Allergy   ? Aortic atherosclerosis ([place])   ? Diabetes  mellitus, type 2 ([place])   ? no medicines- was pre diabtes- diet controlled   ? Diverticulitis   ? Essential hypertension   ? GERD (gastroesophageal reflux disease)   ? Hyperlipidemia   ? Hypertension   ? Hypothyroidism   ? Obesity   ? Osteoporosis   ? Sleep apnea   ? mild -no cpap  ? Transient insomnia   ? Tubular adenoma of colon 05/2013  ? ? ?Past Surgical History:  ?Procedure Laterality Date  ? APPENDECTOMY    ? BUNIONECTOMY    ? right foot  ? CARDIAC CAT[facility]TERIZATION  1994  ? [facility]   ? CATARACT EXTRACTION, BILATERAL Left 10/20/2017  ? CATARACT EXTRACTION, BILATERAL Right 10/27/2017  ? CHOLECYSTECTOMY    ? COLONOSCOPY  05/25/2016  ? Dr.Stark  ? COLONOSCOPY    ? March 2023  ? HAMMER TOE SURGERY  12/2012  ? left toe  ? PARTIAL HYSTERECTOMY    ? POLYPECTOMY    ? ROBOTIC ASSISTED SALPINGO OOPHERECTOMY    ? right ovary first removed then left ovary removed years later  ? TONSILLECTOMY  age 9  ? TOTAL THYROIDECTOMY  09/2011  ? ? ?Current Medications: ?Current Meds  ?Medication Sig  ? aspirin 81 MG tablet Take 81 mg by mouth daily.  ? Calcium Carbonate-Vitamin D (CALCIUM + D PO) Take 1 tablet by mouth daily.   ? carvedilol (COREG) 12.5 MG tablet Take 1 tablet (12.5 mg total) by mouth 2 (two) times daily.  ? cetirizine (ZYRTEC) 10 MG tablet Take 10 mg by mouth as needed.   ? chlorthalidone (HYGROTON) 25 MG tablet Take 1 tablet (25 mg total) by mouth daily.  ? cholecalciferol (VITAMIN D) 1000 units tablet Take 1,000 Units by mouth daily.  ? ezetimibe (ZETIA) 10 MG tablet Take 1 tablet (10 mg total) by mouth daily.  ? fenofibrate 54 MG tablet fenofibrate 54 mg tablet  ? levothyroxine (SYNTHROID) 112 MCG tablet Synthroid 112 mcg tablet ? TAKE 1 TABLET BY MOUTH DAILY . ON SUNDAYS TAKE 1/2 TABLET  ? Omega-3 Fatty Acids (OMEGA 3 PO) Take by mouth.  ? pantoprazole (PROTONIX) 40 MG tablet Take 1 tablet (40 mg total) by mouth daily. t  ? ? ?Allergies:   Cefaclor, Penicillins, and Talwin [pentazocine]  ? ?Social History   ? ?Socioeconomic History  ? Marital status: Married  ?  Spouse name: Not on file  ? Number of children: 2  ? Years of education: Not on file  ? Highest education level: Not on file  ?Occupational History  ? Occupation: Retired  ?Tobacco Use  ? Smoking status: Former  ?  Packs/day: 1.00  ?  Years: 30.00  ?  Pack years: 30.00  ?  Types: Cigarettes  ?  Quit date: 07/06/1998  ?  Years since quitting: 23.3  ? Smokeless tobacco: Never  ?Vaping Use  ? Vaping Use: Never used  ?Substance and Sexual Activity  ? Alcohol use: No  ?  Alcohol/week: 0.0 standard drinks  ? Drug use: No  ? Sexual activity: Not on file  ?Other Topics Concern  ? Not on file  ?  Social History Narrative  ? Not on file  ? ?Social Determinants of Health  ? ?Financial Resource Strain: Low Risk   ? Difficulty of Paying Living Expenses: Not hard at all  ?Food Insecurity: Not on file  ?Transportation Needs: Not on file  ?Physical Activity: Not on file  ?Stress: Not on file  ?Social Connections: Not on file  ?  ? ?Family History:  ?The patient's family history includes Breast cancer in her cousin; Colon cancer in her maternal aunt and maternal uncle; Colon polyps in her maternal aunt and maternal uncle; Esophageal cancer in her father; Heart disease in her maternal uncle; Prostate cancer in her maternal uncle; Stomach cancer in her maternal uncle; Throat cancer in her father. There is no history of Diabetes, Kidney disease, Gallbladder disease, or Rectal cancer. ? ?ROS:   ?12 point review of systems is negative unless otherwise noted in the HPI. ? ? ?EKGs/Labs/Other Studies Reviewed:   ? ?Studies reviewed were summarized above. The additional studies were reviewed today: ? ?2D echo 09/30/2021: ?1. Left ventricular ejection fraction, by estimation, is 60 to 65%. The  ?left ventricle has normal function. The left ventricle has no regional  ?wall motion abnormalities. Left ventricular diastolic parameters are  ?consistent with Grade I diastolic  ?dysfunction  (impaired relaxation).  ? 2. Right ventricular systolic function is normal. The right ventricular  ?size is normal.  ? 3. The mitral valve is normal in structure. Mild mitral valve  ?regurgitation. No evidence of mit

## 2021-11-06 ENCOUNTER — Ambulatory Visit: Payer: PPO | Admitting: Physician Assistant

## 2021-11-06 ENCOUNTER — Encounter: Payer: Self-pay | Admitting: Physician Assistant

## 2021-11-06 VITALS — BP 138/78 | HR 62 | Ht 66.5 in | Wt 167.4 lb

## 2021-11-06 DIAGNOSIS — I10 Essential (primary) hypertension: Secondary | ICD-10-CM | POA: Diagnosis not present

## 2021-11-06 DIAGNOSIS — E785 Hyperlipidemia, unspecified: Secondary | ICD-10-CM

## 2021-11-06 DIAGNOSIS — I700 Atherosclerosis of aorta: Secondary | ICD-10-CM

## 2021-11-06 NOTE — Patient Instructions (Signed)
Medication Instructions:  ?No changes at this time.  ? ?*If you need a refill on your cardiac medications before your next appointment, please call your pharmacy* ? ? ?Lab Work: ?None ? ?If you have labs (blood work) drawn today and your tests are completely normal, you will receive your results only by: ?MyChart Message (if you have MyChart) OR ?A paper copy in the mail ?If you have any lab test that is abnormal or we need to change your treatment, we will call you to review the results. ? ? ?Testing/Procedures: ?None ? ? ?Follow-Up: ?At [facility], you and your health needs are our priority.  As part of our continuing mission to provide you with exceptional heart care, we have created designated Provider Care Teams.  These Care Teams include your primary Cardiologist (physician) and Advanced Practice Providers (APPs -  Physician Assistants and Nurse Practitioners) who all work together to provide you with the care you need, when you need it. ? ?We recommend signing up for the patient portal called "MyChart".  Sign up information is provided on this After Visit Summary.  MyChart is used to connect with patients for Virtual Visits (Telemedicine).  Patients are able to view lab/test results, encounter notes, upcoming appointments, etc.  Non-urgent messages can be sent to your provider as well.   ?To learn more about what you can do with MyChart, go to [email or web address].   ? ?Your next appointment:   ?6 month(s) ? ?The format for your next appointment:   ?In Person ? ?Provider:   ?Kathlyn Sacramento, MD or Christell Faith, PA-C  ? ? ? ?Important Information About Sugar ? ? ? ? ?  ?

## 2021-11-17 ENCOUNTER — Other Ambulatory Visit: Payer: Self-pay

## 2021-11-17 MED ORDER — PANTOPRAZOLE SODIUM 40 MG PO TBEC: 40.0000 mg | DELAYED_RELEASE_TABLET | Freq: Every day | ORAL | 3 refills | Status: DC

## 2021-12-04 ENCOUNTER — Other Ambulatory Visit: Payer: Self-pay | Admitting: Cardiovascular Disease

## 2021-12-05 ENCOUNTER — Ambulatory Visit: Payer: PPO | Admitting: Nurse Practitioner

## 2021-12-08 ENCOUNTER — Encounter: Payer: Self-pay | Admitting: Nurse Practitioner

## 2021-12-08 ENCOUNTER — Ambulatory Visit (INDEPENDENT_AMBULATORY_CARE_PROVIDER_SITE_OTHER): Payer: PPO | Admitting: Nurse Practitioner

## 2021-12-08 VITALS — BP 140/62 | HR 58 | Temp 98.3°F | Resp 16 | Ht 66.5 in | Wt 165.2 lb

## 2021-12-08 DIAGNOSIS — I10 Essential (primary) hypertension: Secondary | ICD-10-CM

## 2021-12-08 DIAGNOSIS — J3089 Other allergic rhinitis: Secondary | ICD-10-CM

## 2021-12-08 DIAGNOSIS — E119 Type 2 diabetes mellitus without complications: Secondary | ICD-10-CM | POA: Diagnosis not present

## 2021-12-08 DIAGNOSIS — E782 Mixed hyperlipidemia: Secondary | ICD-10-CM

## 2021-12-08 LAB — POCT GLYCOSYLATED HEMOGLOBIN (HGB A1C): Hemoglobin A1C: 6.6 % — AB (ref 4.0–5.6)

## 2021-12-08 NOTE — Progress Notes (Signed)
[facility] [place], [place] [address]  Internal MEDICINE  Office Visit Note  Patient Name: Toni [name]  [id number]  [id number]  Date of Service: 12/08/2021  Chief Complaint  Patient presents with  . Follow-up  . Diabetes  . Gastroesophageal Reflux  . Hyperlipidemia  . Hypertension    HPI Pa presents for follow-up visit for diabetes and hypertension.  Her A1c is 6.6 today which is stable and no significant change from previous A1c of 6.5 in January.  She was recently on vacation with family and did well with no significant weight gain.  She continues to see cardiologist Dr. Fletcher Anon who is working on  A1c 6.6, recently on vacation, staying stable.  Still working with cardiologist on controlling BP, currently on carvedilol 25 mg bid.  Chlorthalidone 25 mg daily.  Decreased stress, headache, possible side effect.   Current Medication: Outpatient Encounter Medications as of 12/08/2021  Medication Sig  . aspirin 81 MG tablet Take 81 mg by mouth daily.  . Calcium Carbonate-Vitamin D (CALCIUM + D PO) Take 1 tablet by mouth daily.   . carvedilol (COREG) 12.5 MG tablet Take 1 tablet (12.5 mg total) by mouth 2 (two) times daily.  . cetirizine (ZYRTEC) 10 MG tablet Take 10 mg by mouth as needed.   . chlorthalidone (HYGROTON) 25 MG tablet TAKE 1 TABLET BY MOUTH ONCE A DAY  . cholecalciferol (VITAMIN D) 1000 units tablet Take 1,000 Units by mouth daily.  Marland Kitchen ezetimibe (ZETIA) 10 MG tablet Take 1 tablet (10 mg total) by mouth daily.  . fenofibrate 54 MG tablet fenofibrate 54 mg tablet  . levothyroxine (SYNTHROID) 112 MCG tablet Synthroid 112 mcg tablet  TAKE 1 TABLET BY MOUTH DAILY . ON SUNDAYS TAKE 1/2 TABLET  . Omega-3 Fatty Acids (OMEGA 3 PO) Take by mouth.  . pantoprazole (PROTONIX) 40 MG tablet Take 1 tablet (40 mg total) by mouth daily. t   No facility-administered encounter medications on file as of 12/08/2021.    Surgical History: Past Surgical History:   Procedure Laterality Date  . APPENDECTOMY    . BUNIONECTOMY     right foot  . [facility]   . CATARACT EXTRACTION, BILATERAL Left 10/20/2017  . CATARACT EXTRACTION, BILATERAL Right 10/27/2017  . CHOLECYSTECTOMY    . COLONOSCOPY  05/25/2016   Dr.Stark  . COLONOSCOPY     March 2023  . HAMMER TOE SURGERY  12/2012   left toe  . PARTIAL HYSTERECTOMY    . POLYPECTOMY    . ROBOTIC ASSISTED SALPINGO OOPHERECTOMY     right ovary first removed then left ovary removed years later  . TONSILLECTOMY  age 55  . TOTAL THYROIDECTOMY  09/2011    Medical History: Past Medical History:  Diagnosis Date  . Allergic rhinitis   . Allergy   . Aortic atherosclerosis ([place])   . Diabetes mellitus, type 2 (HCC)    no medicines- was pre diabtes- diet controlled   . Diverticulitis   . Essential hypertension   . GERD (gastroesophageal reflux disease)   . Hyperlipidemia   . Hypertension   . Hypothyroidism   . Obesity   . Osteoporosis   . Sleep apnea    mild -no cpap  . Transient insomnia   . Tubular adenoma of colon 05/2013    Family History: Family History  Problem Relation Age of Onset  . Throat cancer Father   . Esophageal cancer Father   .  Colon cancer Maternal Aunt   . Colon polyps Maternal Aunt   . Colon cancer Maternal Uncle   . Colon polyps Maternal Uncle   . Heart disease Maternal Uncle   . Stomach cancer Maternal Uncle   . Prostate cancer Maternal Uncle   . Breast cancer Cousin   . Diabetes Neg Hx   . Kidney disease Neg Hx   . Gallbladder disease Neg Hx   . Rectal cancer Neg Hx     Social History   Socioeconomic History  . Marital status: Married    Spouse name: Not on file  . Number of children: 2  . Years of education: Not on file  . Highest education level: Not on file  Occupational History  . Occupation: Retired  Tobacco Use  . Smoking status: Former    Packs/day: 1.00    Years: 30.00    Pack years: 30.00    Types:  Cigarettes    Quit date: 07/06/1998    Years since quitting: 23.4  . Smokeless tobacco: Never  Vaping Use  . Vaping Use: Never used  Substance and Sexual Activity  . Alcohol use: No    Alcohol/week: 0.0 standard drinks  . Drug use: No  . Sexual activity: Not on file  Other Topics Concern  . Not on file  Social History Narrative  . Not on file   Social Determinants of Health   Financial Resource Strain: Low Risk   . Difficulty of Paying Living Expenses: Not hard at all  Food Insecurity: Not on file  Transportation Needs: Not on file  Physical Activity: Not on file  Stress: Not on file  Social Connections: Not on file  Intimate Partner Violence: Not on file      Review of Systems  Constitutional:  Negative for chills, fatigue and unexpected weight change.  HENT:  Positive for postnasal drip. Negative for congestion, rhinorrhea, sneezing and sore throat.   Eyes:  Negative for redness.  Respiratory:  Negative for cough, chest tightness and shortness of breath.   Cardiovascular:  Negative for chest pain and palpitations.  Gastrointestinal:  Negative for abdominal pain, constipation, diarrhea, nausea and vomiting.  Genitourinary:  Negative for dysuria and frequency.  Musculoskeletal:  Negative for arthralgias, back pain, joint swelling and neck pain.  Skin:  Negative for rash.  Neurological: Negative.  Negative for tremors and numbness.  Hematological:  Negative for adenopathy. Does not bruise/bleed easily.  Psychiatric/Behavioral:  Negative for behavioral problems (Depression), sleep disturbance and suicidal ideas. The patient is not nervous/anxious.    Vital Signs: BP 140/62   Pulse (!) 58   Temp 98.3 F (36.8 C)   Resp 16   Ht 5' 6.5" (1.689 m)   Wt 165 lb 3.2 oz (74.9 kg)   SpO2 98%   BMI 26.26 kg/m    Physical Exam Vitals reviewed.  Constitutional:      General: She is not in acute distress.    Appearance: Normal appearance. She is not ill-appearing.  HENT:      Head: Normocephalic and atraumatic.  Eyes:     Pupils: Pupils are equal, round, and reactive to light.  Cardiovascular:     Rate and Rhythm: Normal rate and regular rhythm.     Heart sounds: Normal heart sounds. No murmur heard. Pulmonary:     Effort: Pulmonary effort is normal. No respiratory distress.     Breath sounds: Normal breath sounds. No wheezing.  Neurological:     Mental Status: She is  alert and oriented to person, place, and time.  Psychiatric:        Mood and Affect: Mood normal.        Behavior: Behavior normal.       Assessment/Plan: 1. Resistant hypertension Seeing cardiology, as referred previously. BP slightly improved but still suboptimal.   2. Diet-controlled type 2 diabetes mellitus (HCC) *** - POCT HgB A1C - Microalbumin, urine  3. Mixed hyperlipidemia ***  4. Non-seasonal allergic rhinitis due to other allergic trigger ***   General Counseling: Leim Fabry understanding of the findings of todays visit and agrees with plan of treatment. I have discussed any further diagnostic evaluation that may be needed or ordered today. We also reviewed her medications today. she has been encouraged to call the office with any questions or concerns that should arise related to todays visit.    Orders Placed This Encounter  Procedures  . Microalbumin, urine  . POCT HgB A1C    No orders of the defined types were placed in this encounter.   Return in about 3 months (around 03/10/2022) for F/U, Recheck A1C, Cyntha Brickman PCP.   Total time spent:30 Minutes Time spent includes review of chart, medications, test results, and follow up plan with the patient.   [place] Controlled Substance Database was reviewed by me.  This patient was seen by [name], FNP-C in collaboration with Dr. Clayborn Bigness as a part of collaborative care agreement.   Josiah Wojtaszek R. Valetta Fuller, MSN, FNP-C Internal medicine

## 2021-12-09 ENCOUNTER — Encounter: Payer: Self-pay | Admitting: Nurse Practitioner

## 2021-12-09 LAB — MICROALBUMIN, URINE: Microalbumin, Urine: 10 ug/mL

## 2021-12-16 ENCOUNTER — Other Ambulatory Visit: Payer: Self-pay | Admitting: Nurse Practitioner

## 2021-12-31 ENCOUNTER — Telehealth: Payer: PPO

## 2022-01-21 DIAGNOSIS — Z1231 Encounter for screening mammogram for malignant neoplasm of breast: Secondary | ICD-10-CM | POA: Diagnosis not present

## 2022-01-28 ENCOUNTER — Other Ambulatory Visit: Payer: Self-pay | Admitting: Internal Medicine

## 2022-02-26 ENCOUNTER — Encounter: Payer: Self-pay | Admitting: Physician Assistant

## 2022-02-26 ENCOUNTER — Ambulatory Visit (INDEPENDENT_AMBULATORY_CARE_PROVIDER_SITE_OTHER): Payer: PPO | Admitting: Physician Assistant

## 2022-02-26 VITALS — BP 137/72 | HR 61 | Temp 98.0°F | Resp 16 | Ht 66.5 in | Wt 164.0 lb

## 2022-02-26 DIAGNOSIS — K5792 Diverticulitis of intestine, part unspecified, without perforation or abscess without bleeding: Secondary | ICD-10-CM

## 2022-02-26 MED ORDER — CIPROFLOXACIN HCL 500 MG PO TABS: 500.0000 mg | ORAL_TABLET | Freq: Two times a day (BID) | ORAL | 0 refills | Status: AC

## 2022-02-26 NOTE — Progress Notes (Signed)
[facility] [place], [place] [address]  Internal MEDICINE  Office Visit Note  Patient Name: Toni [name]  [id number]  [id number]  Date of Service: 03/05/2022  Chief Complaint  Patient presents with   Diarrhea   Abdominal Pain   Diverticulitis   Nausea     HPI Pt is here for a sick visit. -Started with nausea on Monday and then had vomiting and diarrhea. Tuesday started with abdominal discomfort all over.  -She has a hx of diverticulitis and this is how she typically presents.  -Vomiting stopped tues, but nausea, abdominal discomfort, and diarrhea continues. Denies any blood in stool -Taking protonix -Had her colonoscopy in March and polyp removed and told she would repeat in 5 years. Diverticulosis seen -Normally can take 10days of ABX and improves  Current Medication:  Outpatient Encounter Medications as of 02/26/2022  Medication Sig   aspirin 81 MG tablet Take 81 mg by mouth daily.   Calcium Carbonate-Vitamin D (CALCIUM + D PO) Take 1 tablet by mouth daily.    cetirizine (ZYRTEC) 10 MG tablet Take 10 mg by mouth as needed.    chlorthalidone (HYGROTON) 25 MG tablet TAKE 1 TABLET BY MOUTH ONCE A DAY   cholecalciferol (VITAMIN D) 1000 units tablet Take 1,000 Units by mouth daily.   ciprofloxacin (CIPRO) 500 MG tablet Take 1 tablet (500 mg total) by mouth 2 (two) times daily for 10 days.   ezetimibe (ZETIA) 10 MG tablet Take 1 tablet (10 mg total) by mouth daily.   fenofibrate 54 MG tablet TAKE 1 TABLET BY MOUTH EVERY DAY FOR ELEVATED CHOLESTEROL   Omega-3 Fatty Acids (OMEGA 3 PO) Take by mouth.   pantoprazole (PROTONIX) 40 MG tablet Take 1 tablet (40 mg total) by mouth daily. t   SYNTHROID 112 MCG tablet TAKE 1 TABLET BY MOUTH EVERY MORNING   carvedilol (COREG) 12.5 MG tablet Take 1 tablet (12.5 mg total) by mouth 2 (two) times daily.   No facility-administered encounter medications on file as of 02/26/2022.      Medical History: Past  Medical History:  Diagnosis Date   Allergic rhinitis    Allergy    Aortic atherosclerosis ([place])    Diabetes mellitus, type 2 (HCC)    no medicines- was pre diabtes- diet controlled    Diverticulitis    Essential hypertension    GERD (gastroesophageal reflux disease)    Hyperlipidemia    Hypertension    Hypothyroidism    Obesity    Osteoporosis    Sleep apnea    mild -no cpap   Transient insomnia    Tubular adenoma of colon 05/2013     Vital Signs: BP 137/72   Pulse 61   Temp 98 F (36.7 C)   Resp 16   Ht 5' 6.5" (1.689 m)   Wt 164 lb (74.4 kg)   SpO2 97%   BMI 26.07 kg/m    Review of Systems  Constitutional:  Positive for fatigue. Negative for fever.  HENT:  Negative for congestion, mouth sores and postnasal drip.   Respiratory:  Negative for cough.   Cardiovascular:  Negative for chest pain.  Gastrointestinal:  Positive for abdominal pain, diarrhea, nausea and vomiting. Negative for blood in stool.  Genitourinary:  Negative for flank pain.  Psychiatric/Behavioral: Negative.      Physical Exam Vitals and nursing note reviewed.  Constitutional:      General: She is not in acute distress.    Appearance: Normal appearance.  She is not ill-appearing.  HENT:     Head: Normocephalic and atraumatic.  Eyes:     Pupils: Pupils are equal, round, and reactive to light.  Cardiovascular:     Rate and Rhythm: Normal rate and regular rhythm.     Heart sounds: Normal heart sounds. No murmur heard. Pulmonary:     Effort: Pulmonary effort is normal. No respiratory distress.     Breath sounds: Normal breath sounds. No wheezing.  Abdominal:     Tenderness: There is generalized abdominal tenderness.  Skin:    General: Skin is warm and dry.  Neurological:     Mental Status: She is alert and oriented to person, place, and time.  Psychiatric:        Mood and Affect: Mood normal.        Behavior: Behavior normal.       Assessment/Plan: 1. Diverticulitis Endorses  symptoms similar to prior diverticulitis flares, therefore will do bland diet with plenty of fluid intake and will start on cipro BID. She will monitor her symptoms. - ciprofloxacin (CIPRO) 500 MG tablet; Take 1 tablet (500 mg total) by mouth 2 (two) times daily for 10 days.  Dispense: 20 tablet; Refill: 0   General Counseling: judy pollman understanding of the findings of todays visit and agrees with plan of treatment. I have discussed any further diagnostic evaluation that may be needed or ordered today. We also reviewed her medications today. she has been encouraged to call the office with any questions or concerns that should arise related to todays visit.    Counseling:    No orders of the defined types were placed in this encounter.   Meds ordered this encounter  Medications   ciprofloxacin (CIPRO) 500 MG tablet    Sig: Take 1 tablet (500 mg total) by mouth 2 (two) times daily for 10 days.    Dispense:  20 tablet    Refill:  0    Time spent:30 Minutes

## 2022-03-10 ENCOUNTER — Ambulatory Visit (INDEPENDENT_AMBULATORY_CARE_PROVIDER_SITE_OTHER): Payer: PPO | Admitting: Nurse Practitioner

## 2022-03-10 ENCOUNTER — Encounter: Payer: Self-pay | Admitting: Nurse Practitioner

## 2022-03-10 VITALS — BP 135/80 | HR 62 | Temp 97.5°F | Resp 16 | Ht 66.5 in | Wt 166.8 lb

## 2022-03-10 DIAGNOSIS — I10 Essential (primary) hypertension: Secondary | ICD-10-CM

## 2022-03-10 DIAGNOSIS — E119 Type 2 diabetes mellitus without complications: Secondary | ICD-10-CM

## 2022-03-10 DIAGNOSIS — J3089 Other allergic rhinitis: Secondary | ICD-10-CM

## 2022-03-10 LAB — POCT GLYCOSYLATED HEMOGLOBIN (HGB A1C): Hemoglobin A1C: 6.2 % — AB (ref 4.0–5.6)

## 2022-03-10 NOTE — Progress Notes (Signed)
[facility] [place], [place] [address]  Internal MEDICINE  Office Visit Note  Patient Name: Toni [name]  [id number]  [id number]  Date of Service: 03/10/2022  Chief Complaint  Patient presents with   Follow-up   Diabetes   Gastroesophageal Reflux   Hypertension   Hyperlipidemia    HPI Toni [name] presents for follow-up visit for diabetes, hypertension, and allergic rhinitis/environmental allergies. Diabetes --A1c is 6.2 today which is improved from 6.6 in June.  Patient has been limited in what she can eat recently due to a flareup of diverticulitis but feels that it is starting to resolve. No meds, she is diet controlled Blood pressure was initially elevated but was obtained via monitor.  Blood pressure rechecked manually and improved, see vitals.  She is currently still seeing cardiology for resistant hypertension and is on a medication regimen that has been more effective at keeping her blood pressure under control but the medication side effects have been making her feel tired all the time. Allergic rhinitis/environmental allergies --throat feels thick like pills are getting caught, feels like there is sinus drainage in her throat and wants it to be checked, she also has a itchy dry skin problem in her ears which is treated by ENT with eardrops. Takes cetirizine prn for allergies.    Current Medication: Outpatient Encounter Medications as of 03/10/2022  Medication Sig   aspirin 81 MG tablet Take 81 mg by mouth daily.   Calcium Carbonate-Vitamin D (CALCIUM + D PO) Take 1 tablet by mouth daily.    cetirizine (ZYRTEC) 10 MG tablet Take 10 mg by mouth as needed.    chlorthalidone (HYGROTON) 25 MG tablet TAKE 1 TABLET BY MOUTH ONCE A DAY   cholecalciferol (VITAMIN D) 1000 units tablet Take 1,000 Units by mouth daily.   ezetimibe (ZETIA) 10 MG tablet Take 1 tablet (10 mg total) by mouth daily.   fenofibrate 54 MG tablet TAKE 1 TABLET BY MOUTH EVERY DAY FOR  ELEVATED CHOLESTEROL   Omega-3 Fatty Acids (OMEGA 3 PO) Take by mouth.   pantoprazole (PROTONIX) 40 MG tablet Take 1 tablet (40 mg total) by mouth daily. t   SYNTHROID 112 MCG tablet TAKE 1 TABLET BY MOUTH EVERY MORNING   carvedilol (COREG) 12.5 MG tablet Take 1 tablet (12.5 mg total) by mouth 2 (two) times daily.   No facility-administered encounter medications on file as of 03/10/2022.    Surgical History: Past Surgical History:  Procedure Laterality Date   APPENDECTOMY     BUNIONECTOMY     right foot   [facility]    CATARACT EXTRACTION, BILATERAL Left 10/20/2017   CATARACT EXTRACTION, BILATERAL Right 10/27/2017   CHOLECYSTECTOMY     COLONOSCOPY  05/25/2016   Dr.Stark   COLONOSCOPY     March 2023   HAMMER TOE SURGERY  12/2012   left toe   PARTIAL HYSTERECTOMY     POLYPECTOMY     ROBOTIC ASSISTED SALPINGO OOPHERECTOMY     right ovary first removed then left ovary removed years later   TONSILLECTOMY  age 25   TOTAL THYROIDECTOMY  09/2011    Medical History: Past Medical History:  Diagnosis Date   Allergic rhinitis    Allergy    Aortic atherosclerosis (HCC)    Diabetes mellitus, type 2 (HCC)    no medicines- was pre diabtes- diet controlled    Diverticulitis    Essential hypertension    GERD (gastroesophageal reflux disease)  Hyperlipidemia    Hypertension    Hypothyroidism    Obesity    Osteoporosis    Sleep apnea    mild -no cpap   Transient insomnia    Tubular adenoma of colon 05/2013    Family History: Family History  Problem Relation Age of Onset   Throat cancer Father    Esophageal cancer Father    Colon cancer Maternal Aunt    Colon polyps Maternal Aunt    Colon cancer Maternal Uncle    Colon polyps Maternal Uncle    Heart disease Maternal Uncle    Stomach cancer Maternal Uncle    Prostate cancer Maternal Uncle    Breast cancer Cousin    Diabetes Neg Hx    Kidney disease Neg Hx    Gallbladder disease Neg Hx     Rectal cancer Neg Hx     Social History   Socioeconomic History   Marital status: Married    Spouse name: Not on file   Number of children: 2   Years of education: Not on file   Highest education level: Not on file  Occupational History   Occupation: Retired  Tobacco Use   Smoking status: Former    Packs/day: 1.00    Years: 30.00    Total pack years: 30.00    Types: Cigarettes    Quit date: 07/06/1998    Years since quitting: 23.6   Smokeless tobacco: Never  Vaping Use   Vaping Use: Never used  Substance and Sexual Activity   Alcohol use: No    Alcohol/week: 0.0 standard drinks of alcohol   Drug use: No   Sexual activity: Not on file  Other Topics Concern   Not on file  Social History Narrative   Not on file   Social Determinants of Health   Financial Resource Strain: Low Risk  (12/24/2020)   Overall Financial Resource Strain (CARDIA)    Difficulty of Paying Living Expenses: Not hard at all  Food Insecurity: Not on file  Transportation Needs: Not on file  Physical Activity: Not on file  Stress: Not on file  Social Connections: Not on file  Intimate Partner Violence: Not on file      Review of Systems  Constitutional:  Positive for fatigue (side effect of her BP meds). Negative for chills and fever.  HENT:  Positive for congestion and postnasal drip. Negative for sinus pressure, sinus pain, sneezing and sore throat.   Eyes: Negative.   Respiratory: Negative.  Negative for cough, chest tightness, shortness of breath and wheezing.   Cardiovascular: Negative.  Negative for chest pain and palpitations.  Gastrointestinal: Negative.   Genitourinary: Negative.   Musculoskeletal: Negative.   Allergic/Immunologic: Positive for environmental allergies.    Vital Signs: BP 135/80   Pulse 62   Temp (!) 97.5 F (36.4 C)   Resp 16   Ht 5' 6.5" (1.689 m)   Wt 166 lb 12.8 oz (75.7 kg)   SpO2 97%   BMI 26.52 kg/m    Physical Exam Vitals reviewed.   Constitutional:      General: She is not in acute distress.    Appearance: Normal appearance. She is not ill-appearing.  HENT:     Head: Normocephalic and atraumatic.     Nose: Congestion present. No rhinorrhea.     Mouth/Throat:     Mouth: Mucous membranes are moist.     Pharynx: Oropharynx is clear. No oropharyngeal exudate or posterior oropharyngeal erythema.     Comments:  Some cobblestoning of the pharynx Eyes:     Pupils: Pupils are equal, round, and reactive to light.  Cardiovascular:     Rate and Rhythm: Normal rate and regular rhythm.  Pulmonary:     Effort: Pulmonary effort is normal. No respiratory distress.  Neurological:     Mental Status: She is alert and oriented to person, place, and time.  Psychiatric:        Mood and Affect: Mood normal.        Behavior: Behavior normal.        Assessment/Plan: 1. Diet-controlled type 2 diabetes mellitus (HCC) A1c is 6.2 today which is improved from 6.5 in June and earlier this year.  She is diet controlled, no medications.  Continue diet modifications as previously discussed.  Follow-up in 3 months for repeat A1c - POCT HgB A1C  2. Resistant hypertension Blood pressure is controlled with current medication regimen as adjusted and prescribed by cardiology.  the only concern currently is that the medications make her feel tired and have caused increased fatigue.  Patient will address this concern at her next office visit with cardiology.  3. Non-seasonal allergic rhinitis due to other allergic trigger No signs of acute infection, most likely a flareup of allergic rhinitis due to environmental allergen triggers.  Recommended that patient take cetirizine 10 mg daily consistently for at least 7 days to see if this helps to improve her symptoms.  If symptoms start to improve, continue taking cetirizine daily for 1 to 2 months depending on triggering allergen.  If cetirizine does not improve symptoms at all or if symptoms worsen, call  the clinic and we will reevaluate the clinical presentation.   General Counseling: aurie harroun understanding of the findings of todays visit and agrees with plan of treatment. I have discussed any further diagnostic evaluation that may be needed or ordered today. We also reviewed her medications today. she has been encouraged to call the office with any questions or concerns that should arise related to todays visit.    Orders Placed This Encounter  Procedures   POCT HgB A1C    No orders of the defined types were placed in this encounter.   Return in about 3 months (around 06/09/2022) for F/U, Recheck A1C, Taegen Lennox PCP.   Total time spent:30 Minutes Time spent includes review of chart, medications, test results, and follow up plan with the patient.   [place] Controlled Substance Database was reviewed by me.  This patient was seen by [name], FNP-C in collaboration with Dr. Clayborn Bigness as a part of collaborative care agreement.   Cherene Dobbins R. Valetta Fuller, MSN, FNP-C Internal medicine

## 2022-03-23 ENCOUNTER — Other Ambulatory Visit: Payer: Self-pay | Admitting: Internal Medicine

## 2022-03-24 ENCOUNTER — Other Ambulatory Visit: Payer: Self-pay | Admitting: Nurse Practitioner

## 2022-03-24 DIAGNOSIS — E782 Mixed hyperlipidemia: Secondary | ICD-10-CM

## 2022-03-27 ENCOUNTER — Ambulatory Visit (INDEPENDENT_AMBULATORY_CARE_PROVIDER_SITE_OTHER): Payer: PPO | Admitting: Physician Assistant

## 2022-03-27 ENCOUNTER — Encounter: Payer: Self-pay | Admitting: Physician Assistant

## 2022-03-27 VITALS — BP 134/68 | HR 60 | Temp 98.0°F | Resp 16 | Ht 66.5 in | Wt 167.0 lb

## 2022-03-27 DIAGNOSIS — H65191 Other acute nonsuppurative otitis media, right ear: Secondary | ICD-10-CM

## 2022-03-27 MED ORDER — AZITHROMYCIN 250 MG PO TABS: ORAL_TABLET | ORAL | 0 refills | Status: AC

## 2022-03-27 NOTE — Progress Notes (Signed)
[facility] [place], [place] [address]  Internal MEDICINE  Office Visit Note  Patient Name: Toni [name]  [id number]  [id number]  Date of Service: 03/27/2022  Chief Complaint  Patient presents with   Ear Drainage     HPI Pt is here for a sick visit. -She has been having right ear pain and drainage -Started Tuesday with pain and drainage from left ear. Does use ear drops from ENT due to itchy/dry skin in ears. Has also used peroxide. -hearing is a little muffled -Continues to take her cetirizine daily. Does not have any sinus congestion  Current Medication:  Outpatient Encounter Medications as of 03/27/2022  Medication Sig   aspirin 81 MG tablet Take 81 mg by mouth daily.   azithromycin (ZITHROMAX) 250 MG tablet Take 2 tablets on day 1, then 1 tablet daily on days 2 through 5   Calcium Carbonate-Vitamin D (CALCIUM + D PO) Take 1 tablet by mouth daily.    cetirizine (ZYRTEC) 10 MG tablet Take 10 mg by mouth as needed.    chlorthalidone (HYGROTON) 25 MG tablet TAKE 1 TABLET BY MOUTH ONCE A DAY   cholecalciferol (VITAMIN D) 1000 units tablet Take 1,000 Units by mouth daily.   ezetimibe (ZETIA) 10 MG tablet TAKE 1 TABLET BY MOUTH EVERY DAY   fenofibrate 54 MG tablet TAKE 1 TABLET BY MOUTH EVERY DAY FOR ELEVATED CHOLESTEROL   Omega-3 Fatty Acids (OMEGA 3 PO) Take by mouth.   pantoprazole (PROTONIX) 40 MG tablet Take 1 tablet (40 mg total) by mouth daily. t   SYNTHROID 112 MCG tablet TAKE 1 TABLET BY MOUTH EVERY MORNING   carvedilol (COREG) 12.5 MG tablet Take 1 tablet (12.5 mg total) by mouth 2 (two) times daily.   No facility-administered encounter medications on file as of 03/27/2022.      Medical History: Past Medical History:  Diagnosis Date   Allergic rhinitis    Allergy    Aortic atherosclerosis ([facility])    Diabetes mellitus, type 2 (HCC)    no medicines- was pre diabtes- diet controlled    Diverticulitis    Essential hypertension     GERD (gastroesophageal reflux disease)    Hyperlipidemia    Hypertension    Hypothyroidism    Obesity    Osteoporosis    Sleep apnea    mild -no cpap   Transient insomnia    Tubular adenoma of colon 05/2013     Vital Signs: BP 134/68   Pulse 60   Temp 98 F (36.7 C)   Resp 16   Ht 5' 6.5" (1.689 m)   Wt 167 lb (75.8 kg)   SpO2 96%   BMI 26.55 kg/m    Review of Systems  Constitutional:  Negative for fatigue and fever.  HENT:  Positive for ear discharge, ear pain and hearing loss. Negative for congestion, mouth sores, postnasal drip, sinus pressure and tinnitus.   Respiratory:  Negative for cough.   Cardiovascular:  Negative for chest pain.  Genitourinary:  Negative for flank pain.  Psychiatric/Behavioral: Negative.      Physical Exam Vitals and nursing note reviewed.  Constitutional:      Appearance: Normal appearance.  HENT:     Head: Normocephalic.     Left Ear: Tympanic membrane normal.     Ears:     Comments: Dry and flaky skin of both canals. Erythema of right TM Eyes:     Extraocular Movements: Extraocular movements intact.  Cardiovascular:  Rate and Rhythm: Normal rate and regular rhythm.  Pulmonary:     Effort: Pulmonary effort is normal.     Breath sounds: Normal breath sounds.  Musculoskeletal:     Cervical back: Normal range of motion.  Skin:    General: Skin is warm and dry.  Neurological:     Mental Status: She is alert.  Psychiatric:        Mood and Affect: Mood normal.        Behavior: Behavior normal.        Thought Content: Thought content normal.        Judgment: Judgment normal.       Assessment/Plan: 1. Other non-recurrent acute nonsuppurative otitis media of right ear May continue drops from ENT for skin irritation of ear canals and will start on zpak. Continue cetirizine daily. If worsening or not improving then will follow up with ENT. - azithromycin (ZITHROMAX) 250 MG tablet; Take 2 tablets on day 1, then 1 tablet daily  on days 2 through 5  Dispense: 6 tablet; Refill: 0   General Counseling: Toni [name] understanding of the findings of todays visit and agrees with plan of treatment. I have discussed any further diagnostic evaluation that may be needed or ordered today. We also reviewed her medications today. she has been encouraged to call the office with any questions or concerns that should arise related to todays visit.    Counseling:    No orders of the defined types were placed in this encounter.   Meds ordered this encounter  Medications   azithromycin (ZITHROMAX) 250 MG tablet    Sig: Take 2 tablets on day 1, then 1 tablet daily on days 2 through 5    Dispense:  6 tablet    Refill:  0    Time spent:25 Minutes

## 2022-04-13 ENCOUNTER — Encounter: Payer: Self-pay | Admitting: Internal Medicine

## 2022-04-13 ENCOUNTER — Telehealth (INDEPENDENT_AMBULATORY_CARE_PROVIDER_SITE_OTHER): Payer: PPO | Admitting: Internal Medicine

## 2022-04-13 VITALS — Resp 16 | Ht 66.0 in

## 2022-04-13 DIAGNOSIS — J45909 Unspecified asthma, uncomplicated: Secondary | ICD-10-CM

## 2022-04-13 MED ORDER — LEVOFLOXACIN 500 MG PO TABS: 500.0000 mg | ORAL_TABLET | Freq: Every day | ORAL | 0 refills | Status: DC

## 2022-04-13 MED ORDER — HYDROCOD POLI-CHLORPHE POLI ER 10-8 MG/5ML PO SUER: 5.0000 mL | Freq: Two times a day (BID) | ORAL | 0 refills | Status: DC | PRN

## 2022-04-13 MED ORDER — PREDNISONE 10 MG PO TABS: ORAL_TABLET | ORAL | 0 refills | Status: DC

## 2022-04-13 NOTE — Progress Notes (Signed)
[facility] [place], [place] [address]  Internal MEDICINE  Telephone Visit  Patient Name: Toni [name]  [id number]  [id number]  Date of Service: 04/13/2022  I connected with the patient at 940 am  by telephone and verified the patients identity using two identifiers.   I discussed the limitations, risks, security and privacy concerns of performing an evaluation and management service by telephone and the availability of in person appointments. I also discussed with the patient that there may be a patient responsible charge related to the service.  The patient expressed understanding and agrees to proceed.    Chief Complaint  Patient presents with   Telephone Screen    [phone], prefers to talk over the phone   Telephone Assessment   Fever    Tested negative for Covid   Cough    Having lots of congestion, coughing up green mucus, up all night coughing   Diarrhea    HPI Pt is connected via telephone for acute visit C/o fever or chills, coughing with sputum ( green in color)  COVID negative x 2 ( vaccinated and boosted once)  She has ben sick since Saturday, now getting more congested in the chest     Current Medication: Outpatient Encounter Medications as of 04/13/2022  Medication Sig   aspirin 81 MG tablet Take 81 mg by mouth daily.   Calcium Carbonate-Vitamin D (CALCIUM + D PO) Take 1 tablet by mouth daily.    cetirizine (ZYRTEC) 10 MG tablet Take 10 mg by mouth as needed.    chlorpheniramine-HYDROcodone (TUSSIONEX) 10-8 MG/5ML Take 5 mLs by mouth every 12 (twelve) hours as needed for cough.   chlorthalidone (HYGROTON) 25 MG tablet TAKE 1 TABLET BY MOUTH ONCE A DAY   cholecalciferol (VITAMIN D) 1000 units tablet Take 1,000 Units by mouth daily.   ezetimibe (ZETIA) 10 MG tablet TAKE 1 TABLET BY MOUTH EVERY DAY   fenofibrate 54 MG tablet TAKE 1 TABLET BY MOUTH EVERY DAY FOR ELEVATED CHOLESTEROL   levofloxacin (LEVAQUIN) 500 MG tablet Take 1  tablet (500 mg total) by mouth daily.   Omega-3 Fatty Acids (OMEGA 3 PO) Take by mouth.   pantoprazole (PROTONIX) 40 MG tablet Take 1 tablet (40 mg total) by mouth daily. t   predniSONE (DELTASONE) 10 MG tablet Take one tab 3 x day for 3 days, then take one tab 2 x a day for 3 days and then take one tab a day for 3 days for copd   SYNTHROID 112 MCG tablet TAKE 1 TABLET BY MOUTH EVERY MORNING   carvedilol (COREG) 12.5 MG tablet Take 1 tablet (12.5 mg total) by mouth 2 (two) times daily.   No facility-administered encounter medications on file as of 04/13/2022.    Surgical History: Past Surgical History:  Procedure Laterality Date   APPENDECTOMY     BUNIONECTOMY     right foot   [facility]    CATARACT EXTRACTION, BILATERAL Left 10/20/2017   CATARACT EXTRACTION, BILATERAL Right 10/27/2017   CHOLECYSTECTOMY     COLONOSCOPY  05/25/2016   Dr.Stark   COLONOSCOPY     March 2023   HAMMER TOE SURGERY  12/2012   left toe   PARTIAL HYSTERECTOMY     POLYPECTOMY     ROBOTIC ASSISTED SALPINGO OOPHERECTOMY     right ovary first removed then left ovary removed years later   TONSILLECTOMY  age 76   TOTAL THYROIDECTOMY  09/2011  Medical History: Past Medical History:  Diagnosis Date   Allergic rhinitis    Allergy    Aortic atherosclerosis (HCC)    Diabetes mellitus, type 2 (HCC)    no medicines- was pre diabtes- diet controlled    Diverticulitis    Essential hypertension    GERD (gastroesophageal reflux disease)    Hyperlipidemia    Hypertension    Hypothyroidism    Obesity    Osteoporosis    Sleep apnea    mild -no cpap   Transient insomnia    Tubular adenoma of colon 05/2013    Family History: Family History  Problem Relation Age of Onset   Throat cancer Father    Esophageal cancer Father    Colon cancer Maternal Aunt    Colon polyps Maternal Aunt    Colon cancer Maternal Uncle    Colon polyps Maternal Uncle    Heart disease Maternal  Uncle    Stomach cancer Maternal Uncle    Prostate cancer Maternal Uncle    Breast cancer Cousin    Diabetes Neg Hx    Kidney disease Neg Hx    Gallbladder disease Neg Hx    Rectal cancer Neg Hx     Social History   Socioeconomic History   Marital status: Married    Spouse name: Not on file   Number of children: 2   Years of education: Not on file   Highest education level: Not on file  Occupational History   Occupation: Retired  Tobacco Use   Smoking status: Former    Packs/day: 1.00    Years: 30.00    Total pack years: 30.00    Types: Cigarettes    Quit date: 07/06/1998    Years since quitting: 23.7   Smokeless tobacco: Never  Vaping Use   Vaping Use: Never used  Substance and Sexual Activity   Alcohol use: No    Alcohol/week: 0.0 standard drinks of alcohol   Drug use: No   Sexual activity: Not on file  Other Topics Concern   Not on file  Social History Narrative   Not on file   Social Determinants of Health   Financial Resource Strain: Low Risk  (12/24/2020)   Overall Financial Resource Strain (CARDIA)    Difficulty of Paying Living Expenses: Not hard at all  Food Insecurity: Not on file  Transportation Needs: Not on file  Physical Activity: Not on file  Stress: Not on file  Social Connections: Not on file  Intimate Partner Violence: Not on file      Review of Systems  Constitutional:  Positive for chills and fever. Negative for fatigue.  HENT:  Positive for congestion and postnasal drip. Negative for mouth sores.   Respiratory:  Positive for cough.   Cardiovascular:  Negative for chest pain.  Genitourinary:  Negative for flank pain.  Psychiatric/Behavioral: Negative.      Vital Signs: Resp 16   Ht '5\' 6"'$  (1.676 m)   BMI 26.95 kg/m    Observation/Objective: Pt sounded congested and was hoarse on the phone but was not in resp distress     Assessment/Plan: \1. Acute asthmatic bronchitis Pt is instructed to call the office for worsening  symptoms, might need CXR  - levofloxacin (LEVAQUIN) 500 MG tablet; Take 1 tablet (500 mg total) by mouth daily.  Dispense: 10 tablet; Refill: 0 - predniSONE (DELTASONE) 10 MG tablet; Take one tab 3 x day for 3 days, then take one tab 2 x a day for  3 days and then take one tab a day for 3 days for copd  Dispense: 18 tablet; Refill: 0 - chlorpheniramine-HYDROcodone (TUSSIONEX) 10-8 MG/5ML; Take 5 mLs by mouth every 12 (twelve) hours as needed for cough.  Dispense: 90 mL; Refill: 0  General Counseling: yohanna tow understanding of the findings of today's phone visit and agrees with plan of treatment. I have discussed any further diagnostic evaluation that may be needed or ordered today. We also reviewed her medications today. she has been encouraged to call the office with any questions or concerns that should arise related to todays visit.    No orders of the defined types were placed in this encounter.   Meds ordered this encounter  Medications   levofloxacin (LEVAQUIN) 500 MG tablet    Sig: Take 1 tablet (500 mg total) by mouth daily.    Dispense:  10 tablet    Refill:  0   predniSONE (DELTASONE) 10 MG tablet    Sig: Take one tab 3 x day for 3 days, then take one tab 2 x a day for 3 days and then take one tab a day for 3 days for copd    Dispense:  18 tablet    Refill:  0   chlorpheniramine-HYDROcodone (TUSSIONEX) 10-8 MG/5ML    Sig: Take 5 mLs by mouth every 12 (twelve) hours as needed for cough.    Dispense:  90 mL    Refill:  0    Time spent:15 Minutes    Dr Lavera Guise Internal medicine

## 2022-04-13 NOTE — Addendum Note (Signed)
Addended by: Lavera Guise on: 04/13/2022 09:51 AM   Modules accepted: Level of Service

## 2022-04-15 ENCOUNTER — Other Ambulatory Visit: Payer: Self-pay | Admitting: Internal Medicine

## 2022-04-15 ENCOUNTER — Telehealth: Payer: Self-pay

## 2022-04-15 ENCOUNTER — Ambulatory Visit
Admission: RE | Admit: 2022-04-15 | Discharge: 2022-04-15 | Disposition: A | Discharge status: Home / Self Care | Payer: PPO | Source: Ambulatory Visit | Attending: Internal Medicine | Admitting: Internal Medicine

## 2022-04-15 ENCOUNTER — Ambulatory Visit
Admission: RE | Admit: 2022-04-15 | Discharge: 2022-04-15 | Disposition: A | Discharge status: Home / Self Care | Payer: PPO | Attending: Internal Medicine | Admitting: Internal Medicine

## 2022-04-15 DIAGNOSIS — I10 Essential (primary) hypertension: Secondary | ICD-10-CM | POA: Diagnosis not present

## 2022-04-15 DIAGNOSIS — J45909 Unspecified asthma, uncomplicated: Secondary | ICD-10-CM | POA: Diagnosis not present

## 2022-04-15 DIAGNOSIS — R059 Cough, unspecified: Secondary | ICD-10-CM | POA: Diagnosis not present

## 2022-04-15 NOTE — Telephone Encounter (Signed)
Pt advised that we order Chest xray

## 2022-04-15 NOTE — Telephone Encounter (Signed)
done

## 2022-04-16 ENCOUNTER — Telehealth: Payer: Self-pay

## 2022-04-16 NOTE — Telephone Encounter (Signed)
Lmom chest xray is normal

## 2022-04-16 NOTE — Telephone Encounter (Signed)
-----   Message from Lavera Guise, MD sent at 04/16/2022  9:18 AM EDT ----- Normal CXR

## 2022-04-16 NOTE — Progress Notes (Signed)
Normal CXR

## 2022-05-02 ENCOUNTER — Other Ambulatory Visit: Payer: Self-pay | Admitting: Cardiovascular Disease

## 2022-05-05 ENCOUNTER — Ambulatory Visit: Payer: PPO | Admitting: Nurse Practitioner

## 2022-05-09 NOTE — Progress Notes (Unsigned)
Cardiology Office Note    Date:  05/14/2022   ID:  Jia, Dottavio 1946/05/21, MRN [id number]  PCP:  [name], NP  Cardiologist:  Kathlyn Sacramento, MD  Electrophysiologist:  None   Chief Complaint: Follow-up  History of Present Illness:   Toni [name] is a 76 y.o. female with history of aortic atherosclerosis, DM2, HTN, HLD, hypothyroidism, obesity, and mild sleep apnea that does not require CPAP usage who presents for follow-up of HTN.   She was previously evaluated by Dr. Fletcher Anon in 2016 for atypical chest pain.  CT of the abdomen showed aortic atherosclerosis.  Carotid Doppler showed mild intimal thickening with calcified plaque bilaterally.  ABI was normal bilaterally.  Echo showed normal LV systolic function with no significant valvular abnormalities.  Lexiscan MPI showed no evidence of ischemia with a normal EF and was overall low risk.  She was referred back to Dr. Fletcher Anon in 09/2021 for evaluation of hypertension with intolerance to multiple medications including ACE inhibitor and ARB, due to dry cough.  It was noted she had previously been on amlodipine and diltiazem, though these did not adequately control her blood pressure.  She had previously been on beta-blocker for migraines, though this led to bradycardia.  She was subsequently placed on nifedipine, though noted increased lower extremity swelling involving the left leg with this.  She had been on HCTZ for quite a while and was tolerating this medication.  She reported her blood pressure became uncontrolled over the preceding year.  There was some mild exertional dyspnea and fatigue.  She was without chest pain.  Blood pressure in the office was elevated at 152/80.  Elevated BP was felt to likely be essential in etiology.  Given lower extremity swelling, nifedipine was stopped, HCTZ was transitioned to chlorthalidone, and carvedilol was added.  Given fatigue and dyspnea, as well as murmur suggestive of aortic sclerosis on  exam, she underwent echo on 09/30/2021 which demonstrated an EF of 60 to 65%, no regional wall motion abnormalities, grade 1 diastolic dysfunction, normal RV systolic function and ventricular cavity size, mild mitral regurgitation, and an estimated right atrial pressure of 3 mmHg.  She was seen in follow up on 10/07/2021 and was doing well.  Since initiating carvedilol and transitioning HCTZ to chlorthalidone, she continued to note elevated BP readings typically in the 562Z to 308M systolic.  However, since undergoing these medication changes she had only had 1 systolic blood pressure reading greater than 200 mmHg.  She did note some increase in fatigue following the initiation of carvedilol.  Following the discontinuation of nifedipine she noted resolution of lower extremity swelling.  Coreg was titrated to 12.5 mg bid and she was continued on chlorthalidone 25 mg.  Renal artery ultrasound showed no evidence of RAS bilaterally.  Rennin aldosterone and plasma metanephrines were normal.    She was last seen in the office in 11/2021 and was without symptoms of angina or decompensation.  With titration of carvedilol, she noted improvement in BP readings at home with most readings in the 578I systolic.  She was adjusting to some fatigue associated with carvedilol.  She comes in today and is without symptoms of angina or decompensation.  She has continued to note fatigue following the initiation and subsequent titration of carvedilol.  She does try and remain active, though finds herself napping in the afternoons.  No significant lower extremity swelling, dizziness, presyncope, or syncope.  She does remain active at home and is able to  push mow the lawn and rake leaves without angina.  Blood pressure readings at home have been in the 761P to 509T systolic.  She would like to come off carvedilol.  Despite discontinuation of ACE inhibitor/ARB she has continued to note a cough.  She does intermittently take  Protonix.   Labs independently reviewed: 03/2022 - A1c 6.2 09/2021 - BUN 14, serum creatinine 0.86, potassium 3.9 06/2021 - TSH normal 03/2021 - TC 264, TG 199, HDL 48, LDL 179, albumin 4.4, AST/ALT normal, Hgb 15.3, PLT 234  Past Medical History:  Diagnosis Date   Allergic rhinitis    Allergy    Aortic atherosclerosis ([place])    Diabetes mellitus, type 2 ([place])    no medicines- was pre diabtes- diet controlled    Diverticulitis    Essential hypertension    GERD (gastroesophageal reflux disease)    Hyperlipidemia    Hypertension    Hypothyroidism    Obesity    Osteoporosis    Sleep apnea    mild -no cpap   Transient insomnia    Tubular adenoma of colon 05/2013    Past Surgical History:  Procedure Laterality Date   APPENDECTOMY     BUNIONECTOMY     right foot   [facility]    CATARACT EXTRACTION, BILATERAL Left 10/20/2017   CATARACT EXTRACTION, BILATERAL Right 10/27/2017   CHOLECYSTECTOMY     COLONOSCOPY  05/25/2016   Dr.Stark   COLONOSCOPY     March 2023   HAMMER TOE SURGERY  12/2012   left toe   PARTIAL HYSTERECTOMY     POLYPECTOMY     ROBOTIC ASSISTED SALPINGO OOPHERECTOMY     right ovary first removed then left ovary removed years later   TONSILLECTOMY  age 40   TOTAL THYROIDECTOMY  09/2011    Current Medications: Current Meds  Medication Sig   aspirin 81 MG tablet Take 81 mg by mouth daily.   Calcium Carbonate-Vitamin D (CALCIUM + D PO) Take 1 tablet by mouth daily.    cetirizine (ZYRTEC) 10 MG tablet Take 10 mg by mouth as needed.    chlorthalidone (HYGROTON) 25 MG tablet TAKE 1 TABLET BY MOUTH ONCE A DAY   cholecalciferol (VITAMIN D) 1000 units tablet Take 1,000 Units by mouth daily.   ezetimibe (ZETIA) 10 MG tablet TAKE 1 TABLET BY MOUTH EVERY DAY   fenofibrate 54 MG tablet TAKE 1 TABLET BY MOUTH EVERY DAY FOR ELEVATED CHOLESTEROL   Omega-3 Fatty Acids (OMEGA 3 PO) Take by mouth.   pantoprazole (PROTONIX) 40 MG tablet  Take 1 tablet (40 mg total) by mouth daily. t   spironolactone (ALDACTONE) 25 MG tablet Take 1 tablet (25 mg total) by mouth daily.   SYNTHROID 112 MCG tablet TAKE 1 TABLET BY MOUTH EVERY MORNING   [DISCONTINUED] carvedilol (COREG) 12.5 MG tablet Take 1 tablet (12.5 mg total) by mouth 2 (two) times daily.    Allergies:   Cefaclor, Penicillins, and Talwin [pentazocine]   Social History   Socioeconomic History   Marital status: Married    Spouse name: Not on file   Number of children: 2   Years of education: Not on file   Highest education level: Not on file  Occupational History   Occupation: Retired  Tobacco Use   Smoking status: Former    Packs/day: 1.00    Years: 30.00    Total pack years: 30.00    Types: Cigarettes    Quit date:  07/06/1998    Years since quitting: 23.8   Smokeless tobacco: Never  Vaping Use   Vaping Use: Never used  Substance and Sexual Activity   Alcohol use: No    Alcohol/week: 0.0 standard drinks of alcohol   Drug use: No   Sexual activity: Not on file  Other Topics Concern   Not on file  Social History Narrative   Not on file   Social Determinants of Health   Financial Resource Strain: Low Risk  (12/24/2020)   Overall Financial Resource Strain (CARDIA)    Difficulty of Paying Living Expenses: Not hard at all  Food Insecurity: Not on file  Transportation Needs: Not on file  Physical Activity: Not on file  Stress: Not on file  Social Connections: Not on file     Family History:  The patient's family history includes Breast cancer in her cousin; Colon cancer in her maternal aunt and maternal uncle; Colon polyps in her maternal aunt and maternal uncle; Esophageal cancer in her father; Heart disease in her maternal uncle; Prostate cancer in her maternal uncle; Stomach cancer in her maternal uncle; Throat cancer in her father. There is no history of Diabetes, Kidney disease, Gallbladder disease, or Rectal cancer.  ROS:   12-point review of systems  is negative unless otherwise noted in the HPI.   EKGs/Labs/Other Studies Reviewed:    Studies reviewed were summarized above. The additional studies were reviewed today:  2D echo 09/30/2021: 1. Left ventricular ejection fraction, by estimation, is 60 to 65%. The  left ventricle has normal function. The left ventricle has no regional  wall motion abnormalities. Left ventricular diastolic parameters are  consistent with Grade I diastolic  dysfunction (impaired relaxation).   2. Right ventricular systolic function is normal. The right ventricular  size is normal.   3. The mitral valve is normal in structure. Mild mitral valve  regurgitation. No evidence of mitral stenosis.   4. The aortic valve is tricuspid. Aortic valve regurgitation is not  visualized. No aortic stenosis is present.   5. The inferior vena cava is normal in size with greater than 50%  respiratory variability, suggesting right atrial pressure of 3 mmHg. __________   Carl[facility] Adam MPI 12/19/2014: Pharmacological myocardial perfusison imaging study with no significnat ischemia. The left ventricular ejection fraction is hyperdynamic (>65%). No wall motion abnormality noted. No EKG changes concerning for ischemia. This is a low risk study.   EKG:  EKG is ordered today.  The EKG ordered today demonstrates NSR, 64 bpm, no acute ST-T changes  Recent Labs: 06/23/2021: TSH 0.454 09/17/2021: BUN 14; Creatinine, Ser 0.86; Potassium 3.9; Sodium 143  Recent Lipid Panel    Component Value Date/Time   CHOL 264 (H) 04/04/2021 0844   TRIG 199 (H) 04/04/2021 0844   HDL 48 04/04/2021 0844   CHOLHDL 5.5 (H) 04/04/2021 0844   LDLCALC 179 (H) 04/04/2021 0844    PHYSICAL EXAM:    VS:  BP (!) 140/80 (BP Location: Left Arm, Patient Position: Sitting, Cuff Size: Normal)   Pulse 64   Ht 5' 6.5" (1.689 m)   Wt 160 lb 6 oz (72.7 kg)   SpO2 97%   BMI 25.50 kg/m   BMI: Body mass index is 25.5 kg/m.  Physical Exam Vitals reviewed.   Constitutional:      Appearance: She is well-developed.  HENT:     Head: Normocephalic and atraumatic.  Eyes:     General:        Right eye: No  discharge.        Left eye: No discharge.  Neck:     Vascular: No JVD.  Cardiovascular:     Rate and Rhythm: Normal rate and regular rhythm.     Pulses:          Posterior tibial pulses are 2+ on the right side and 2+ on the left side.     Heart sounds: S1 normal and S2 normal. Heart sounds not distant. No midsystolic click and no opening snap. Murmur heard.     Systolic murmur is present with a grade of 1/6 at the upper left sternal border.     No friction rub.  Pulmonary:     Effort: Pulmonary effort is normal. No respiratory distress.     Breath sounds: Normal breath sounds. No decreased breath sounds, wheezing or rales.  Chest:     Chest wall: No tenderness.  Abdominal:     General: There is no distension.  Musculoskeletal:     Cervical back: Normal range of motion.     Right lower leg: No edema.     Left lower leg: No edema.  Skin:    General: Skin is warm and dry.     Nails: There is no clubbing.  Neurological:     Mental Status: She is alert and oriented to person, place, and time.  Psychiatric:        Speech: Speech normal.        Behavior: Behavior normal.        Thought Content: Thought content normal.        Judgment: Judgment normal.     Wt Readings from Last 3 Encounters:  05/14/22 160 lb 6 oz (72.7 kg)  03/27/22 167 lb (75.8 kg)  03/10/22 166 lb 12.8 oz (75.7 kg)     ASSESSMENT & PLAN:   HTN: Blood pressure is reasonably controlled in the office today and consistent with prior reading, however blood pressure does continue to run on the higher side at home.  She has noted significant fatigue following the initiation and titration of carvedilol and would like to come off this medication.  We will discontinue carvedilol.  Start spironolactone 25 mg daily with a follow-up BMP 1 week thereafter.  She will otherwise  continue chlorthalidone.  She has continued to note a cough despite discontinuation of ACE inhibitor and ARB, therefore if her blood pressure continues to run on the high side we may consider rechallenge with ARB.  Low-sodium diet is recommended.  Aortic atherosclerosis/HLD: LDL 179 with a glyceride of 199.  She remains on ezetimibe and fenofibrate.  Intolerant to statins secondary to abnormal LFT.    Disposition: F/u with Dr. Fletcher Anon or an APP in 4 to 6 weeks.   Medication Adjustments/Labs and Tests Ordered: Current medicines are reviewed at length with the patient today.  Concerns regarding medicines are outlined above. Medication changes, Labs and Tests ordered today are summarized above and listed in the Patient Instructions accessible in Encounters.   Signed, Christell Faith, PA-C 05/14/2022 3:29 PM     [place] [place], [place] [address] [phone]

## 2022-05-14 ENCOUNTER — Ambulatory Visit: Payer: PPO | Attending: Physician Assistant | Admitting: Physician Assistant

## 2022-05-14 ENCOUNTER — Encounter: Payer: Self-pay | Admitting: Physician Assistant

## 2022-05-14 VITALS — BP 140/80 | HR 64 | Ht 66.5 in | Wt 160.4 lb

## 2022-05-14 DIAGNOSIS — E785 Hyperlipidemia, unspecified: Secondary | ICD-10-CM | POA: Diagnosis not present

## 2022-05-14 DIAGNOSIS — I700 Atherosclerosis of aorta: Secondary | ICD-10-CM

## 2022-05-14 DIAGNOSIS — I10 Essential (primary) hypertension: Secondary | ICD-10-CM | POA: Diagnosis not present

## 2022-05-14 MED ORDER — SPIRONOLACTONE 25 MG PO TABS: 25.0000 mg | ORAL_TABLET | Freq: Every day | ORAL | 3 refills | Status: DC

## 2022-05-14 NOTE — Patient Instructions (Signed)
Medication Instructions:  Your physician has recommended you make the following change in your medication:   STOP Carvedilol START Spironolactone 25 mg once daily   *If you need a refill on your cardiac medications before your next appointment, please call your pharmacy*   Lab Work: BMET in 1 week. No appointment is needed. Go to the following location:  [facility] Entrance at [facility] 1st desk on the right to check in (REGISTRATION)  Lab hours: Monday- Friday (7:30 am- 5:30 pm)  If you have labs (blood work) drawn today and your tests are completely normal, you will receive your results only by: [place] (if you have MyChart) OR A paper copy in the mail If you have any lab test that is abnormal or we need to change your treatment, we will call you to review the results.   Testing/Procedures: None   Follow-Up: At [facility], you and your health needs are our priority.  As part of our continuing mission to provide you with exceptional heart care, we have created designated Provider Care Teams.  These Care Teams include your primary Cardiologist (physician) and Advanced Practice Providers (APPs -  Physician Assistants and Nurse Practitioners) who all work together to provide you with the care you need, when you need it.  We recommend signing up for the patient portal called "MyChart".  Sign up information is provided on this After Visit Summary.  MyChart is used to connect with patients for Virtual Visits (Telemedicine).  Patients are able to view lab/test results, encounter notes, upcoming appointments, etc.  Non-urgent messages can be sent to your provider as well.   To learn more about what you can do with MyChart, go to [email or web address].    Your next appointment:   4-6  week(s)  The format for your next appointment:   In Person  Provider:   Kathlyn Sacramento, MD or Christell Faith, PA-C      Important Information About Sugar

## 2022-05-20 ENCOUNTER — Telehealth: Payer: Self-pay | Admitting: *Deleted

## 2022-05-20 ENCOUNTER — Ambulatory Visit (INDEPENDENT_AMBULATORY_CARE_PROVIDER_SITE_OTHER): Payer: PPO | Admitting: Nurse Practitioner

## 2022-05-20 ENCOUNTER — Other Ambulatory Visit
Admission: RE | Admit: 2022-05-20 | Discharge: 2022-05-20 | Disposition: A | Discharge status: Home / Self Care | Payer: PPO | Attending: Physician Assistant | Admitting: Physician Assistant

## 2022-05-20 ENCOUNTER — Encounter: Payer: Self-pay | Admitting: Nurse Practitioner

## 2022-05-20 VITALS — BP 150/85 | HR 92 | Temp 97.1°F | Resp 16 | Ht 66.5 in | Wt 159.6 lb

## 2022-05-20 DIAGNOSIS — E782 Mixed hyperlipidemia: Secondary | ICD-10-CM | POA: Diagnosis not present

## 2022-05-20 DIAGNOSIS — Z0001 Encounter for general adult medical examination with abnormal findings: Secondary | ICD-10-CM | POA: Diagnosis not present

## 2022-05-20 DIAGNOSIS — E785 Hyperlipidemia, unspecified: Secondary | ICD-10-CM | POA: Insufficient documentation

## 2022-05-20 DIAGNOSIS — E039 Hypothyroidism, unspecified: Secondary | ICD-10-CM | POA: Diagnosis not present

## 2022-05-20 DIAGNOSIS — I10 Essential (primary) hypertension: Secondary | ICD-10-CM | POA: Diagnosis not present

## 2022-05-20 DIAGNOSIS — I700 Atherosclerosis of aorta: Secondary | ICD-10-CM | POA: Insufficient documentation

## 2022-05-20 LAB — BASIC METABOLIC PANEL
Anion gap: 10 (ref 5–15)
BUN: 17 mg/dL (ref 8–23)
CO2: 28 mmol/L (ref 22–32)
Calcium: 9.9 mg/dL (ref 8.9–10.3)
Chloride: 104 mmol/L (ref 98–111)
Creatinine, Ser: 0.88 mg/dL (ref 0.44–1.00)
GFR, Estimated: >60 mL/min (ref >60)
Glucose, Bld: 172 mg/dL — ABNORMAL HIGH (ref 70–99)
Potassium: 3.9 mmol/L (ref 3.5–5.1)
Sodium: 142 mmol/L (ref 135–145)

## 2022-05-20 NOTE — Telephone Encounter (Signed)
Reviewed results and recommendations with patient. She verbalized understanding with no further questions at this time.  ?

## 2022-05-20 NOTE — Progress Notes (Signed)
[facility] [place], [place] [address]  Internal MEDICINE  Office Visit Note  Patient Name: Toni [name]  [id number]  [id number]  Date of Service: 05/20/2022  Chief Complaint  Patient presents with   Medicare Wellness   Diabetes   Gastroesophageal Reflux   Hyperlipidemia   Hypertension    HPI Alicja presents for an annual well visit and physical exam. Well-appearing 76 year old female with hypertension, high cholesterol, diabetes and hyperthyroidism.  --recently had the flu --DEXA scan done in 2020 --colonscopy done in march this year --mammogram done in July was normal.  --eye exam tomorrow.     Current Medication: Outpatient Encounter Medications as of 05/20/2022  Medication Sig   aspirin 81 MG tablet Take 81 mg by mouth daily.   Calcium Carbonate-Vitamin D (CALCIUM + D PO) Take 1 tablet by mouth daily.    cetirizine (ZYRTEC) 10 MG tablet Take 10 mg by mouth as needed.    chlorthalidone (HYGROTON) 25 MG tablet TAKE 1 TABLET BY MOUTH ONCE A DAY   cholecalciferol (VITAMIN D) 1000 units tablet Take 1,000 Units by mouth daily.   ezetimibe (ZETIA) 10 MG tablet TAKE 1 TABLET BY MOUTH EVERY DAY   fenofibrate 54 MG tablet TAKE 1 TABLET BY MOUTH EVERY DAY FOR ELEVATED CHOLESTEROL   Omega-3 Fatty Acids (OMEGA 3 PO) Take by mouth.   pantoprazole (PROTONIX) 40 MG tablet Take 1 tablet (40 mg total) by mouth daily. t   SYNTHROID 112 MCG tablet TAKE 1 TABLET BY MOUTH EVERY MORNING   [DISCONTINUED] spironolactone (ALDACTONE) 25 MG tablet Take 1 tablet (25 mg total) by mouth daily.   No facility-administered encounter medications on file as of 05/20/2022.    Surgical History: Past Surgical History:  Procedure Laterality Date   APPENDECTOMY     BUNIONECTOMY     right foot   [facility]    CATARACT EXTRACTION, BILATERAL Left 10/20/2017   CATARACT EXTRACTION, BILATERAL Right 10/27/2017   CHOLECYSTECTOMY      COLONOSCOPY  05/25/2016   Dr.Stark   COLONOSCOPY     March 2023   HAMMER TOE SURGERY  12/2012   left toe   PARTIAL HYSTERECTOMY     POLYPECTOMY     ROBOTIC ASSISTED SALPINGO OOPHERECTOMY     right ovary first removed then left ovary removed years later   TONSILLECTOMY  age 89   TOTAL THYROIDECTOMY  09/2011    Medical History: Past Medical History:  Diagnosis Date   Allergic rhinitis    Allergy    Aortic atherosclerosis (HCC)    Diabetes mellitus, type 2 (HCC)    no medicines- was pre diabtes- diet controlled    Diverticulitis    Essential hypertension    GERD (gastroesophageal reflux disease)    Hyperlipidemia    Hypertension    Hypothyroidism    Obesity    Osteoporosis    Sleep apnea    mild -no cpap   Transient insomnia    Tubular adenoma of colon 05/2013    Family History: Family History  Problem Relation Age of Onset   Throat cancer Father    Esophageal cancer Father    Colon cancer Maternal Aunt    Colon polyps Maternal Aunt    Colon cancer Maternal Uncle    Colon polyps Maternal Uncle    Heart disease Maternal Uncle    Stomach cancer Maternal Uncle    Prostate cancer Maternal Uncle  Breast cancer Cousin    Diabetes Neg Hx    Kidney disease Neg Hx    Gallbladder disease Neg Hx    Rectal cancer Neg Hx     Social History   Socioeconomic History   Marital status: Married    Spouse name: Not on file   Number of children: 2   Years of education: Not on file   Highest education level: Not on file  Occupational History   Occupation: Retired  Tobacco Use   Smoking status: Former    Packs/day: 1.00    Years: 30.00    Total pack years: 30.00    Types: Cigarettes    Quit date: 07/06/1998    Years since quitting: 23.9   Smokeless tobacco: Never  Vaping Use   Vaping Use: Never used  Substance and Sexual Activity   Alcohol use: No    Alcohol/week: 0.0 standard drinks of alcohol   Drug use: No   Sexual activity: Not on file  Other Topics Concern    Not on file  Social History Narrative   Not on file   Social Determinants of Health   Financial Resource Strain: Low Risk  (12/24/2020)   Overall Financial Resource Strain (CARDIA)    Difficulty of Paying Living Expenses: Not hard at all  Food Insecurity: Not on file  Transportation Needs: Not on file  Physical Activity: Not on file  Stress: Not on file  Social Connections: Not on file  Intimate Partner Violence: Not on file      Review of Systems  Constitutional:  Negative for activity change, appetite change, chills, fatigue, fever and unexpected weight change.  HENT: Negative.  Negative for congestion, ear pain, rhinorrhea, sore throat and trouble swallowing.   Eyes: Negative.   Respiratory: Negative.  Negative for cough, chest tightness, shortness of breath and wheezing.   Cardiovascular: Negative.  Negative for chest pain.  Gastrointestinal: Negative.  Negative for abdominal pain, blood in stool, constipation, diarrhea, nausea and vomiting.  Endocrine: Negative.   Genitourinary: Negative.  Negative for difficulty urinating, dysuria, frequency, hematuria and urgency.  Musculoskeletal:  Positive for arthralgias. Negative for back pain, joint swelling, myalgias and neck pain.  Skin: Negative.  Negative for rash and wound.  Allergic/Immunologic: Negative.  Negative for immunocompromised state.  Neurological: Negative.  Negative for dizziness, seizures, numbness and headaches.  Hematological: Negative.   Psychiatric/Behavioral: Negative.  Negative for behavioral problems, self-injury and suicidal ideas. The patient is not nervous/anxious.     Vital Signs: BP (!) 150/85 Comment: 163/74  Pulse 92   Temp (!) 97.1 F (36.2 C)   Resp 16   Ht 5' 6.5" (1.689 m)   Wt 159 lb 9.6 oz (72.4 kg)   SpO2 97%   BMI 25.37 kg/m    Physical Exam Vitals reviewed.  Constitutional:      General: She is awake. She is not in acute distress.    Appearance: Normal appearance. She is  well-developed, well-groomed and normal weight. She is not ill-appearing or diaphoretic.  HENT:     Head: Normocephalic and atraumatic.     Right Ear: Tympanic membrane, ear canal and external ear normal.     Left Ear: Tympanic membrane, ear canal and external ear normal.     Nose: Nose normal. No congestion or rhinorrhea.     Mouth/Throat:     Lips: Pink.     Mouth: Mucous membranes are moist.     Pharynx: Oropharynx is clear. Uvula midline. No oropharyngeal  exudate or posterior oropharyngeal erythema.  Eyes:     General: Lids are normal. Vision grossly intact. Gaze aligned appropriately. No scleral icterus.       Right eye: No discharge.        Left eye: No discharge.     Extraocular Movements: Extraocular movements intact.     Conjunctiva/sclera: Conjunctivae normal.     Pupils: Pupils are equal, round, and reactive to light.     Funduscopic exam:    Right eye: Red reflex present.        Left eye: Red reflex present. Neck:     Thyroid: No thyromegaly.     Vascular: No carotid bruit or JVD.     Trachea: Trachea and phonation normal. No tracheal deviation.  Cardiovascular:     Rate and Rhythm: Normal rate and regular rhythm.     Pulses: Normal pulses.     Heart sounds: Normal heart sounds, S1 normal and S2 normal. No murmur heard.    No friction rub. No gallop.  Pulmonary:     Effort: Pulmonary effort is normal. No accessory muscle usage or respiratory distress.     Breath sounds: Normal breath sounds and air entry. No stridor. No wheezing or rales.  Chest:     Chest wall: No tenderness.     Comments: Declines clinical breast exam, gets annual mammograms.  Abdominal:     General: Bowel sounds are normal. There is no distension.     Palpations: Abdomen is soft. There is no shifting dullness, fluid wave, mass or pulsatile mass.     Tenderness: There is no abdominal tenderness. There is no guarding or rebound.  Musculoskeletal:        General: No tenderness or deformity. Normal  range of motion.     Cervical back: Normal range of motion and neck supple.  Lymphadenopathy:     Cervical: No cervical adenopathy.  Skin:    General: Skin is warm and dry.     Capillary Refill: Capillary refill takes less than 2 seconds.     Coloration: Skin is not pale.     Findings: No erythema or rash.  Neurological:     Mental Status: She is alert and oriented to person, place, and time.     Cranial Nerves: No cranial nerve deficit.     Motor: No abnormal muscle tone.     Coordination: Coordination normal.     Deep Tendon Reflexes: Reflexes are normal and symmetric.  Psychiatric:        Behavior: Behavior normal. Behavior is cooperative.        Thought Content: Thought content normal.        Judgment: Judgment normal.        Assessment/Plan: 1. Encounter for general adult medical examination with abnormal findings Age-appropriate preventive screenings and vaccinations discussed, annual physical exam completed. Routine labs for health maintenance ordered, see below. PHM updated.  - Lipid Profile - TSH + free T4  2. Mixed hyperlipidemia Routine lab ordered - Lipid profile  3. Acquired hypothyroidism Routine lab ordered - TSH and free T4     General Counseling: merrin mcvicker understanding of the findings of todays visit and agrees with plan of treatment. I have discussed any further diagnostic evaluation that may be needed or ordered today. We also reviewed her medications today. she has been encouraged to call the office with any questions or concerns that should arise related to todays visit.    Orders Placed This Encounter  Procedures  Lipid Profile   TSH + free T4    No orders of the defined types were placed in this encounter.   Return in about 6 months (around 11/18/2022) for F/U, Everlynn Sagun PCP.   Total time spent:30 Minutes Time spent includes review of chart, medications, test results, and follow up plan with the patient.   [place] Controlled  Substance Database was reviewed by me.  This patient was seen by [name], FNP-C in collaboration with Dr. Clayborn Bigness as a part of collaborative care agreement.  Francesco Provencal R. Valetta Fuller, MSN, FNP-C Internal medicine

## 2022-05-20 NOTE — Telephone Encounter (Signed)
-----   Message from Rise Mu, [place] sent at 05/20/2022  9:06 AM EST ----- Please inform the patient her renal function and potassium are stable on newly started spironolactone.  Continue current medical therapy.

## 2022-05-20 NOTE — Telephone Encounter (Signed)
Left voicemail message to call back for review of results.  

## 2022-05-21 DIAGNOSIS — Z961 Presence of intraocular lens: Secondary | ICD-10-CM | POA: Diagnosis not present

## 2022-05-21 DIAGNOSIS — H52203 Unspecified astigmatism, bilateral: Secondary | ICD-10-CM | POA: Diagnosis not present

## 2022-05-21 DIAGNOSIS — H524 Presbyopia: Secondary | ICD-10-CM | POA: Diagnosis not present

## 2022-06-04 NOTE — Progress Notes (Signed)
Cardiology Office Note    Date:  06/08/2022   ID:  Toni, [name] [birth year], MRN [id number]  PCP:  [name], NP  Cardiologist:  Kathlyn Sacramento, MD  Electrophysiologist:  None   Chief Complaint: Follow-up  History of Present Illness:   Toni [name] is a 76 y.o. female with history of aortic atherosclerosis, DM2, HTN, HLD, hypothyroidism, obesity, and mild sleep apnea that does not require CPAP usage who presents for follow-up of HTN.   She was previously evaluated by Dr. Fletcher Anon in 2016 for atypical chest pain.  CT of the abdomen showed aortic atherosclerosis.  Carotid Doppler showed mild intimal thickening with calcified plaque bilaterally.  ABI was normal bilaterally.  Echo showed normal LV systolic function with no significant valvular abnormalities.  Lexiscan MPI showed no evidence of ischemia with a normal EF and was overall low risk.  She was referred back to Dr. Fletcher Anon in 09/2021 for evaluation of hypertension with intolerance to multiple medications including ACE inhibitor and ARB, due to dry cough.  It was noted she had previously been on amlodipine and diltiazem, though these did not adequately control her blood pressure.  She had previously been on beta-blocker for migraines, though this led to bradycardia.  She was subsequently placed on nifedipine, though noted increased lower extremity swelling involving the left leg with this.  She had been on HCTZ for quite a while and was tolerating this medication.  She reported her blood pressure became uncontrolled over the preceding year.  There was some mild exertional dyspnea and fatigue.  She was without chest pain.  Blood pressure in the office was elevated at 152/80.  Elevated BP was felt to likely be essential in etiology.  Given lower extremity swelling, nifedipine was stopped, HCTZ was transitioned to chlorthalidone, and carvedilol was added.  Given fatigue and dyspnea, as well as murmur suggestive of aortic sclerosis on  exam, she underwent echo on 09/30/2021 which demonstrated an EF of 60 to 65%, no regional wall motion abnormalities, grade 1 diastolic dysfunction, normal RV systolic function and ventricular cavity size, mild mitral regurgitation, and an estimated right atrial pressure of 3 mmHg.  She was seen in follow up on 10/07/2021 and was doing well.  Since initiating carvedilol and transitioning HCTZ to chlorthalidone, she continued to note elevated BP readings typically in the 224M to 250I systolic.  However, since undergoing these medication changes she had only had 1 systolic blood pressure reading greater than 200 mmHg.  She did note some increase in fatigue following the initiation of carvedilol.  Following the discontinuation of nifedipine she noted resolution of lower extremity swelling.  Coreg was titrated to 12.5 mg bid and she was continued on chlorthalidone 25 mg.  Renal artery ultrasound showed no evidence of RAS bilaterally.  Rennin aldosterone and plasma metanephrines were normal.     She was seen in the office in 11/2021 and was without symptoms of angina or decompensation.  With titration of carvedilol, she noted improvement in BP readings at home with most readings in the 370W systolic.  She was adjusting to some fatigue associated with carvedilol.  She was last seen in the office on 05/14/2022 and was without symptoms of angina or decompensation.  She continued to note fatigue following the initiation and subsequent titration of carvedilol.  Blood pressure readings at home were in the 888B to 169I systolic.  She preferred to discontinue carvedilol.  Despite discontinuation of ACE inhibitor/ARB she continued to note a cough.  Blood pressure in the office was 140/80.  Carvedilol was discontinued and she was initiated on spironolactone 25 mg daily.  She comes in doing well from a cardiac perspective and is without symptoms of angina or decompensation.  She has noted an improvement in her fatigue following the  discontinuation of carvedilol.  She now feels like her energy level is back to baseline.  Blood pressure has overall improved following the initiation of spironolactone, though does continue to run in the 024O to 973Z systolic.  At times, she will note some brief dizziness without associated symptoms.  No significant lower extremity swelling or progressive orthopnea.  She does continue to add salt to food.  Continues to note a cough despite discontinuation of ACE inhibitor and ARB that has been present since her COVID illness.  She also reports a recent bout of RSV which she feels like may be contributing to her cough.   Labs independently reviewed: 05/2022 - potassium 3.9, BUN 17, serum creatinine 0.88 03/2022 - A1c 6.2 06/2021 - TSH normal 03/2021 - TC 264, TG 199, HDL 48, LDL 179, albumin 4.4, AST/ALT normal, Hgb 15.3, PLT 234  Past Medical History:  Diagnosis Date   Allergic rhinitis    Allergy    Aortic atherosclerosis ([place])    Diabetes mellitus, type 2 ([place])    no medicines- was pre diabtes- diet controlled    Diverticulitis    Essential hypertension    GERD (gastroesophageal reflux disease)    Hyperlipidemia    Hypertension    Hypothyroidism    Obesity    Osteoporosis    Sleep apnea    mild -no cpap   Transient insomnia    Tubular adenoma of colon 05/2013    Past Surgical History:  Procedure Laterality Date   APPENDECTOMY     BUNIONECTOMY     right foot   [facility]    CATARACT EXTRACTION, BILATERAL Left 10/20/2017   CATARACT EXTRACTION, BILATERAL Right 10/27/2017   CHOLECYSTECTOMY     COLONOSCOPY  05/25/2016   Dr.Stark   COLONOSCOPY     March 2023   HAMMER TOE SURGERY  12/2012   left toe   PARTIAL HYSTERECTOMY     POLYPECTOMY     ROBOTIC ASSISTED SALPINGO OOPHERECTOMY     right ovary first removed then left ovary removed years later   TONSILLECTOMY  age 74   TOTAL THYROIDECTOMY  09/2011    Current Medications: Current Meds   Medication Sig   aspirin 81 MG tablet Take 81 mg by mouth daily.   Calcium Carbonate-Vitamin D (CALCIUM + D PO) Take 1 tablet by mouth daily.    cetirizine (ZYRTEC) 10 MG tablet Take 10 mg by mouth as needed.    chlorthalidone (HYGROTON) 25 MG tablet TAKE 1 TABLET BY MOUTH ONCE A DAY   cholecalciferol (VITAMIN D) 1000 units tablet Take 1,000 Units by mouth daily.   ezetimibe (ZETIA) 10 MG tablet TAKE 1 TABLET BY MOUTH EVERY DAY   fenofibrate 54 MG tablet TAKE 1 TABLET BY MOUTH EVERY DAY FOR ELEVATED CHOLESTEROL   Omega-3 Fatty Acids (OMEGA 3 PO) Take by mouth.   pantoprazole (PROTONIX) 40 MG tablet Take 1 tablet (40 mg total) by mouth daily. t   SYNTHROID 112 MCG tablet TAKE 1 TABLET BY MOUTH EVERY MORNING   [DISCONTINUED] spironolactone (ALDACTONE) 25 MG tablet Take 1 tablet (25 mg total) by mouth daily.    Allergies:   Cefaclor, Penicillins, and  Talwin [pentazocine]   Social History   Socioeconomic History   Marital status: Married    Spouse name: Not on file   Number of children: 2   Years of education: Not on file   Highest education level: Not on file  Occupational History   Occupation: Retired  Tobacco Use   Smoking status: Former    Packs/day: 1.00    Years: 30.00    Total pack years: 30.00    Types: Cigarettes    Quit date: 07/06/1998    Years since quitting: 23.9   Smokeless tobacco: Never  Vaping Use   Vaping Use: Never used  Substance and Sexual Activity   Alcohol use: No    Alcohol/week: 0.0 standard drinks of alcohol   Drug use: No   Sexual activity: Not on file  Other Topics Concern   Not on file  Social History Narrative   Not on file   Social Determinants of Health   Financial Resource Strain: Low Risk  (12/24/2020)   Overall Financial Resource Strain (CARDIA)    Difficulty of Paying Living Expenses: Not hard at all  Food Insecurity: Not on file  Transportation Needs: Not on file  Physical Activity: Not on file  Stress: Not on file  Social  Connections: Not on file     Family History:  The patient's family history includes Breast cancer in her cousin; Colon cancer in her maternal aunt and maternal uncle; Colon polyps in her maternal aunt and maternal uncle; Esophageal cancer in her father; Heart disease in her maternal uncle; Prostate cancer in her maternal uncle; Stomach cancer in her maternal uncle; Throat cancer in her father. There is no history of Diabetes, Kidney disease, Gallbladder disease, or Rectal cancer.  ROS:   12-point review of systems is negative unless otherwise noted in HPI   EKGs/Labs/Other Studies Reviewed:    Studies reviewed were summarized above. The additional studies were reviewed today:  2D echo 09/30/2021: 1. Left ventricular ejection fraction, by estimation, is 60 to 65%. The  left ventricle has normal function. The left ventricle has no regional  wall motion abnormalities. Left ventricular diastolic parameters are  consistent with Grade I diastolic  dysfunction (impaired relaxation).   2. Right ventricular systolic function is normal. The right ventricular  size is normal.   3. The mitral valve is normal in structure. Mild mitral valve  regurgitation. No evidence of mitral stenosis.   4. The aortic valve is tricuspid. Aortic valve regurgitation is not  visualized. No aortic stenosis is present.   5. The inferior vena cava is normal in size with greater than 50%  respiratory variability, suggesting right atrial pressure of 3 mmHg. __________   Carlton Adam MPI 12/19/2014: Pharmacological myocardial perfusison imaging study with no significnat ischemia. The left ventricular ejection fraction is hyperdynamic (>65%). No wall motion abnormality noted. No EKG changes concerning for ischemia. This is a low risk study.   EKG:  EKG is not ordered today.    Recent Labs: 06/23/2021: TSH 0.454 05/20/2022: BUN 17; Creatinine, Ser 0.88; Potassium 3.9; Sodium 142  Recent Lipid Panel    Component Value  Date/Time   CHOL 264 (H) 04/04/2021 0844   TRIG 199 (H) 04/04/2021 0844   HDL 48 04/04/2021 0844   CHOLHDL 5.5 (H) 04/04/2021 0844   LDLCALC 179 (H) 04/04/2021 0844    PHYSICAL EXAM:    VS:  BP (!) 166/82 (BP Location: Left Arm, Patient Position: Sitting, Cuff Size: Normal)   Pulse 74  Ht 5' 6.5" (1.689 m)   Wt 160 lb 3.2 oz (72.7 kg)   SpO2 97%   BMI 25.47 kg/m   BMI: Body mass index is 25.47 kg/m.  Physical Exam Vitals reviewed.  Constitutional:      Appearance: She is well-developed.  HENT:     Head: Normocephalic and atraumatic.  Eyes:     General:        Right eye: No discharge.        Left eye: No discharge.  Neck:     Vascular: No JVD.  Cardiovascular:     Rate and Rhythm: Normal rate and regular rhythm.     Heart sounds: S1 normal and S2 normal. Heart sounds not distant. No midsystolic click and no opening snap. Murmur heard.     Systolic murmur is present with a grade of 1/6 at the upper left sternal border.     No friction rub.  Pulmonary:     Effort: Pulmonary effort is normal. No respiratory distress.     Breath sounds: Normal breath sounds. No decreased breath sounds, wheezing or rales.  Chest:     Chest wall: No tenderness.  Abdominal:     General: There is no distension.  Musculoskeletal:     Cervical back: Normal range of motion.  Skin:    General: Skin is warm and dry.     Nails: There is no clubbing.  Neurological:     Mental Status: She is alert and oriented to person, place, and time.  Psychiatric:        Speech: Speech normal.        Behavior: Behavior normal.        Thought Content: Thought content normal.        Judgment: Judgment normal.     Wt Readings from Last 3 Encounters:  06/08/22 160 lb 3.2 oz (72.7 kg)  05/20/22 159 lb 9.6 oz (72.4 kg)  05/14/22 160 lb 6 oz (72.7 kg)     ASSESSMENT & PLAN:   HTN: Blood pressure is elevated in the office today, though has been better controlled at home.  Titrate spironolactone to 25 mg  twice daily with a follow-up BMP 7 to 10 days after.  I do wonder if some of her elevated BP readings are in the setting of salt intake at home.  Low-sodium diet is recommended.  Could consider rechallenge with ARB given cough has persisted despite discontinuation.  Previously told he did not need CPAP for sleep apnea.  Revisiting sleep study may be worthwhile should we continue to note elevated BP readings despite escalation of medical therapy.  Fatigue resolved following discontinuation of beta-blocker.  Not on calcium channel blocker secondary to associated lower extremity swelling.  Aortic atherosclerosis/HLD: LDL 179 with a triglyceride of 199.  Intolerant to statins secondary to abnormal LFT.  She remains on ezetimibe and fenofibrate.  Cough: Has been present since COVID illness.  Also with recent RSV.  Despite discontinuation of ACE inhibitor/ARB, cough has persisted.  Intermittently taking pantoprazole.  Follow-up with PCP.   Disposition: F/u with Dr. Fletcher Anon or an APP in 2 months.   Medication Adjustments/Labs and Tests Ordered: Current medicines are reviewed at length with the patient today.  Concerns regarding medicines are outlined above. Medication changes, Labs and Tests ordered today are summarized above and listed in the Patient Instructions accessible in Encounters.   Signed, Christell Faith, PA-C 06/08/2022 11:34 AM     [facility] [address]  [place], [place] [address] [phone]

## 2022-06-08 ENCOUNTER — Ambulatory Visit: Payer: PPO | Attending: Physician Assistant | Admitting: Physician Assistant

## 2022-06-08 ENCOUNTER — Encounter: Payer: Self-pay | Admitting: Physician Assistant

## 2022-06-08 VITALS — BP 166/82 | HR 74 | Ht 66.5 in | Wt 160.2 lb

## 2022-06-08 DIAGNOSIS — Z79899 Other long term (current) drug therapy: Secondary | ICD-10-CM

## 2022-06-08 DIAGNOSIS — E785 Hyperlipidemia, unspecified: Secondary | ICD-10-CM

## 2022-06-08 DIAGNOSIS — I10 Essential (primary) hypertension: Secondary | ICD-10-CM

## 2022-06-08 DIAGNOSIS — I700 Atherosclerosis of aorta: Secondary | ICD-10-CM

## 2022-06-08 DIAGNOSIS — R059 Cough, unspecified: Secondary | ICD-10-CM

## 2022-06-08 MED ORDER — SPIRONOLACTONE 25 MG PO TABS
ORAL_TABLET | ORAL | 3 refills | Status: DC
Start: 2022-06-08 — End: 2023-07-02

## 2022-06-08 NOTE — Patient Instructions (Signed)
Medication Instructions:  - Your physician has recommended you make the following change in your medication:   1) INCREASE Spironolactone 25 mg: - take 1 tablet by mouth TWICE daily   *If you need a refill on your cardiac medications before your next appointment, please call your pharmacy*   Lab Work: - Your physician recommends that you return for lab work in: 10 days (around 06/18/22)  [facility] at [facility] 1st desk on the right to check in (REGISTRATION)  Lab hours: Monday- Friday (7:30 am- 5:30 pm)   If you have labs (blood work) drawn today and your tests are completely normal, you will receive your results only by: MyChart Message (if you have MyChart) OR A paper copy in the mail If you have any lab test that is abnormal or we need to change your treatment, we will call you to review the results.   Testing/Procedures: - none ordered   Follow-Up: At [facility], you and your health needs are our priority.  As part of our continuing mission to provide you with exceptional heart care, we have created designated Provider Care Teams.  These Care Teams include your primary Cardiologist (physician) and Advanced Practice Providers (APPs -  Physician Assistants and Nurse Practitioners) who all work together to provide you with the care you need, when you need it.  We recommend signing up for the patient portal called "MyChart".  Sign up information is provided on this After Visit Summary.  MyChart is used to connect with patients for Virtual Visits (Telemedicine).  Patients are able to view lab/test results, encounter notes, upcoming appointments, etc.  Non-urgent messages can be sent to your provider as well.   To learn more about what you can do with MyChart, go to [email or web address].    Your next appointment:   2 month(s)  The format for your next appointment:   In Person  Provider:   You may see Kathlyn Sacramento, MD or one of the following  Advanced Practice Providers on your designated Care Team:    Christell Faith, PA-C   Other Instructions N/a  Important Information About Sugar

## 2022-06-09 ENCOUNTER — Ambulatory Visit: Payer: PPO | Admitting: Nurse Practitioner

## 2022-06-14 ENCOUNTER — Encounter: Payer: Self-pay | Admitting: Nurse Practitioner

## 2022-06-22 DIAGNOSIS — E039 Hypothyroidism, unspecified: Secondary | ICD-10-CM | POA: Diagnosis not present

## 2022-06-22 DIAGNOSIS — E782 Mixed hyperlipidemia: Secondary | ICD-10-CM | POA: Diagnosis not present

## 2022-06-23 ENCOUNTER — Ambulatory Visit (INDEPENDENT_AMBULATORY_CARE_PROVIDER_SITE_OTHER): Payer: PPO | Admitting: Nurse Practitioner

## 2022-06-23 ENCOUNTER — Other Ambulatory Visit
Admission: RE | Admit: 2022-06-23 | Discharge: 2022-06-23 | Disposition: A | Discharge status: Home / Self Care | Payer: PPO | Attending: Physician Assistant | Admitting: Physician Assistant

## 2022-06-23 ENCOUNTER — Telehealth: Payer: Self-pay

## 2022-06-23 ENCOUNTER — Encounter: Payer: Self-pay | Admitting: Nurse Practitioner

## 2022-06-23 VITALS — BP 138/80 | HR 72 | Temp 96.5°F | Resp 16 | Ht 66.5 in | Wt 160.8 lb

## 2022-06-23 DIAGNOSIS — J0110 Acute frontal sinusitis, unspecified: Secondary | ICD-10-CM

## 2022-06-23 DIAGNOSIS — E039 Hypothyroidism, unspecified: Secondary | ICD-10-CM

## 2022-06-23 DIAGNOSIS — Z79899 Other long term (current) drug therapy: Secondary | ICD-10-CM | POA: Insufficient documentation

## 2022-06-23 DIAGNOSIS — I1A0 Resistant hypertension: Secondary | ICD-10-CM | POA: Diagnosis not present

## 2022-06-23 DIAGNOSIS — E119 Type 2 diabetes mellitus without complications: Secondary | ICD-10-CM | POA: Diagnosis not present

## 2022-06-23 DIAGNOSIS — I10 Essential (primary) hypertension: Secondary | ICD-10-CM | POA: Insufficient documentation

## 2022-06-23 DIAGNOSIS — E782 Mixed hyperlipidemia: Secondary | ICD-10-CM

## 2022-06-23 LAB — TSH+FREE T4
Free T4: 1.58 ng/dL (ref 0.82–1.77)
TSH: 0.11 u[IU]/mL — ABNORMAL LOW (ref 0.450–4.500)

## 2022-06-23 LAB — LIPID PANEL
Chol/HDL Ratio: 5 ratio — ABNORMAL HIGH (ref 0.0–4.4)
Cholesterol, Total: 242 mg/dL — ABNORMAL HIGH (ref 100–199)
HDL: 48 mg/dL (ref >39)
LDL Chol Calc (NIH): 163 mg/dL — ABNORMAL HIGH (ref 0–99)
Triglycerides: 168 mg/dL — ABNORMAL HIGH (ref 0–149)
VLDL Cholesterol Cal: 31 mg/dL (ref 5–40)

## 2022-06-23 LAB — POCT GLYCOSYLATED HEMOGLOBIN (HGB A1C): Hemoglobin A1C: 6.4 % — AB (ref 4.0–5.6)

## 2022-06-23 LAB — BASIC METABOLIC PANEL
Anion gap: 9 (ref 5–15)
BUN: 22 mg/dL (ref 8–23)
CO2: 26 mmol/L (ref 22–32)
Calcium: 9.7 mg/dL (ref 8.9–10.3)
Chloride: 103 mmol/L (ref 98–111)
Creatinine, Ser: 0.84 mg/dL (ref 0.44–1.00)
GFR, Estimated: >60 mL/min (ref >60)
Glucose, Bld: 159 mg/dL — ABNORMAL HIGH (ref 70–99)
Potassium: 4 mmol/L (ref 3.5–5.1)
Sodium: 138 mmol/L (ref 135–145)

## 2022-06-23 MED ORDER — AZITHROMYCIN 250 MG PO TABS: ORAL_TABLET | ORAL | 0 refills | Status: AC

## 2022-06-23 NOTE — Telephone Encounter (Signed)
Toni [name], [place] 06/23/2022  1:23 PM EST Back to Top    Called patient. No answer. LMOV.   [name], NP 06/23/2022  9:07 AM EST     Kidney function and electrolytes are normal following titration of spironolactone.  Glucose moderately elevated in setting of known diabetes.

## 2022-06-23 NOTE — Progress Notes (Signed)
[facility] [place], [place] [address]  Internal MEDICINE  Office Visit Note  Patient Name: Toni [name]  [id number]  [id number]  Date of Service: 06/23/2022  Chief Complaint  Patient presents with   Follow-up   Diabetes   Gastroesophageal Reflux   Hypertension   Hyperlipidemia    HPI Toni [name] presents for a follow up visit for diabetes, high cholesterol and hypertension.  Diabetes -- A1c 6.4. working on diet modifications.  High cholesterol -- improving  Hypertension -- sees cardiology, BP ok  Low TSH -- 0.110 s/p thyroidectomy. --has chronic postsurgical hypothyroidism has been on 112 mcg daily synthroid for years.     Current Medication: Outpatient Encounter Medications as of 06/23/2022  Medication Sig   aspirin 81 MG tablet Take 81 mg by mouth daily.   azithromycin (ZITHROMAX) 250 MG tablet Take 2 tablets on day 1, then 1 tablet daily on days 2 through 5   Calcium Carbonate-Vitamin D (CALCIUM + D PO) Take 1 tablet by mouth daily.    cetirizine (ZYRTEC) 10 MG tablet Take 10 mg by mouth as needed.    chlorthalidone (HYGROTON) 25 MG tablet TAKE 1 TABLET BY MOUTH ONCE A DAY   cholecalciferol (VITAMIN D) 1000 units tablet Take 1,000 Units by mouth daily.   ezetimibe (ZETIA) 10 MG tablet TAKE 1 TABLET BY MOUTH EVERY DAY   fenofibrate 54 MG tablet TAKE 1 TABLET BY MOUTH EVERY DAY FOR ELEVATED CHOLESTEROL   Omega-3 Fatty Acids (OMEGA 3 PO) Take by mouth.   pantoprazole (PROTONIX) 40 MG tablet Take 1 tablet (40 mg total) by mouth daily. t   spironolactone (ALDACTONE) 25 MG tablet Take 1 tablet (25 mg) by mouth twice daily   SYNTHROID 112 MCG tablet TAKE 1 TABLET BY MOUTH EVERY MORNING   No facility-administered encounter medications on file as of 06/23/2022.    Surgical History: Past Surgical History:  Procedure Laterality Date   APPENDECTOMY     BUNIONECTOMY     right foot   [facility]    CATARACT  EXTRACTION, BILATERAL Left 10/20/2017   CATARACT EXTRACTION, BILATERAL Right 10/27/2017   CHOLECYSTECTOMY     COLONOSCOPY  05/25/2016   Dr.Stark   COLONOSCOPY     March 2023   HAMMER TOE SURGERY  12/2012   left toe   PARTIAL HYSTERECTOMY     POLYPECTOMY     ROBOTIC ASSISTED SALPINGO OOPHERECTOMY     right ovary first removed then left ovary removed years later   TONSILLECTOMY  age 55   TOTAL THYROIDECTOMY  09/2011    Medical History: Past Medical History:  Diagnosis Date   Allergic rhinitis    Allergy    Aortic atherosclerosis (HCC)    Diabetes mellitus, type 2 (HCC)    no medicines- was pre diabtes- diet controlled    Diverticulitis    Essential hypertension    GERD (gastroesophageal reflux disease)    Hyperlipidemia    Hypertension    Hypothyroidism    Obesity    Osteoporosis    Sleep apnea    mild -no cpap   Transient insomnia    Tubular adenoma of colon 05/2013    Family History: Family History  Problem Relation Age of Onset   Throat cancer Father    Esophageal cancer Father    Colon cancer Maternal Aunt    Colon polyps Maternal Aunt    Colon cancer Maternal Uncle    Colon polyps  Maternal Uncle    Heart disease Maternal Uncle    Stomach cancer Maternal Uncle    Prostate cancer Maternal Uncle    Breast cancer Cousin    Diabetes Neg Hx    Kidney disease Neg Hx    Gallbladder disease Neg Hx    Rectal cancer Neg Hx     Social History   Socioeconomic History   Marital status: Married    Spouse name: Not on file   Number of children: 2   Years of education: Not on file   Highest education level: Not on file  Occupational History   Occupation: Retired  Tobacco Use   Smoking status: Former    Packs/day: 1.00    Years: 30.00    Total pack years: 30.00    Types: Cigarettes    Quit date: 07/06/1998    Years since quitting: 23.9   Smokeless tobacco: Never  Vaping Use   Vaping Use: Never used  Substance and Sexual Activity   Alcohol use: No     Alcohol/week: 0.0 standard drinks of alcohol   Drug use: No   Sexual activity: Not on file  Other Topics Concern   Not on file  Social History Narrative   Not on file   Social Determinants of Health   Financial Resource Strain: Low Risk  (12/24/2020)   Overall Financial Resource Strain (CARDIA)    Difficulty of Paying Living Expenses: Not hard at all  Food Insecurity: Not on file  Transportation Needs: Not on file  Physical Activity: Not on file  Stress: Not on file  Social Connections: Not on file  Intimate Partner Violence: Not on file      Review of Systems  Constitutional:  Negative for chills, fatigue and unexpected weight change.  HENT:  Positive for postnasal drip. Negative for congestion, rhinorrhea, sneezing and sore throat.   Eyes:  Negative for redness.  Respiratory:  Negative for cough, chest tightness and shortness of breath.   Cardiovascular:  Negative for chest pain and palpitations.  Gastrointestinal:  Negative for abdominal pain, constipation, diarrhea, nausea and vomiting.  Genitourinary:  Negative for dysuria and frequency.  Musculoskeletal:  Negative for arthralgias, back pain, joint swelling and neck pain.  Skin:  Negative for rash.  Neurological: Negative.  Negative for tremors and numbness.  Hematological:  Negative for adenopathy. Does not bruise/bleed easily.  Psychiatric/Behavioral:  Negative for behavioral problems (Depression), sleep disturbance and suicidal ideas. The patient is not nervous/anxious.     Vital Signs: BP 138/80 Comment: 152/80  Pulse 72   Temp (!) 96.5 F (35.8 C)   Resp 16   Ht 5' 6.5" (1.689 m)   Wt 160 lb 12.8 oz (72.9 kg)   SpO2 99%   BMI 25.56 kg/m    Physical Exam Vitals reviewed.  Constitutional:      General: She is not in acute distress.    Appearance: Normal appearance. She is not ill-appearing.  HENT:     Head: Normocephalic and atraumatic.  Eyes:     Pupils: Pupils are equal, round, and reactive to  light.  Cardiovascular:     Rate and Rhythm: Normal rate and regular rhythm.     Heart sounds: Normal heart sounds. No murmur heard. Pulmonary:     Effort: Pulmonary effort is normal. No respiratory distress.     Breath sounds: Normal breath sounds. No wheezing.  Neurological:     Mental Status: She is alert and oriented to person, place, and time.  Psychiatric:        Mood and Affect: Mood normal.        Behavior: Behavior normal.        Assessment/Plan: 1. Acute non-recurrent frontal sinusitis Empiric antibiotic ordered - azithromycin (ZITHROMAX) 250 MG tablet; Take 2 tablets on day 1, then 1 tablet daily on days 2 through 5  Dispense: 6 tablet; Refill: 0  2. Diet-controlled type 2 diabetes mellitus (HCC) Stable, A1c 6.4, continue diet and lifestyle modifications as discussed - POCT glycosylated hemoglobin (Hb A1C)  3. Resistant hypertension Stable, sees cardiology  4. Acquired hypothyroidism Stays slightly hyper, doing well. Sees endocrinology  5. Mixed hyperlipidemia Continuing to improve, continue statin.     General Counseling: careli luzader understanding of the findings of todays visit and agrees with plan of treatment. I have discussed any further diagnostic evaluation that may be needed or ordered today. We also reviewed her medications today. she has been encouraged to call the office with any questions or concerns that should arise related to todays visit.    Orders Placed This Encounter  Procedures   POCT glycosylated hemoglobin (Hb A1C)    Meds ordered this encounter  Medications   azithromycin (ZITHROMAX) 250 MG tablet    Sig: Take 2 tablets on day 1, then 1 tablet daily on days 2 through 5    Dispense:  6 tablet    Refill:  0    Return in about 14 weeks (around 09/29/2022) for F/U, Recheck A1C, Emary Zalar PCP.   Total time spent:30 Minutes Time spent includes review of chart, medications, test results, and follow up plan with the patient.   [place]  Controlled Substance Database was reviewed by me.  This patient was seen by [name], FNP-C in collaboration with Dr. Clayborn Bigness as a part of collaborative care agreement.   Madex Seals R. Valetta Fuller, MSN, FNP-C Internal medicine

## 2022-08-05 NOTE — Progress Notes (Unsigned)
Cardiology Office Note    Date:  08/11/2022   ID:  Frankee, Gritz 1946/04/19, MRN [id number]  PCP:  [name], NP  Cardiologist:  Kathlyn Sacramento, MD  Electrophysiologist:  None   Chief Complaint: Follow up  History of Present Illness:   AMONIE WISSER is a 76 y.o. female with history of aortic atherosclerosis, DM2, HTN, HLD, hypothyroidism, obesity, and mild sleep apnea that does not require CPAP usage who presents for follow-up of HTN.   She was previously evaluated by Dr. Fletcher Anon in 2016 for atypical chest pain.  CT of the abdomen showed aortic atherosclerosis.  Carotid Doppler showed mild intimal thickening with calcified plaque bilaterally.  ABI was normal bilaterally.  Echo showed normal LV systolic function with no significant valvular abnormalities.  Lexiscan MPI showed no evidence of ischemia with a normal EF and was overall low risk.  She was referred back to Dr. Fletcher Anon in 09/2021 for evaluation of hypertension with intolerance to multiple medications including ACE inhibitor and ARB, due to dry cough.  It was noted she had previously been on amlodipine and diltiazem, though these did not adequately control her blood pressure.  She had previously been on beta-blocker for migraines, though this led to bradycardia.  She was subsequently placed on nifedipine, though noted increased lower extremity swelling involving the left leg with this.  She had been on HCTZ for quite a while and was tolerating this medication.  She reported her blood pressure became uncontrolled over the preceding year.  There was some mild exertional dyspnea and fatigue.  She was without chest pain.  Blood pressure in the office was elevated at 152/80.  Elevated BP was felt to likely be essential in etiology.  Given lower extremity swelling, nifedipine was stopped, HCTZ was transitioned to chlorthalidone, and carvedilol was added.  Given fatigue and dyspnea, as well as murmur suggestive of aortic sclerosis on  exam, she underwent echo on 09/30/2021 which demonstrated an EF of 60 to 65%, no regional wall motion abnormalities, grade 1 diastolic dysfunction, normal RV systolic function and ventricular cavity size, mild mitral regurgitation, and an estimated right atrial pressure of 3 mmHg.  She was seen in follow up on 10/07/2021 and was doing well.  Since initiating carvedilol and transitioning HCTZ to chlorthalidone, she continued to note elevated BP readings typically in the 253G to 644I systolic.  However, since undergoing these medication changes she had only had 1 systolic blood pressure reading greater than 200 mmHg.  She did note some increase in fatigue following the initiation of carvedilol.  Following the discontinuation of nifedipine she noted resolution of lower extremity swelling.  Coreg was titrated to 12.5 mg bid and she was continued on chlorthalidone 25 mg.  Renal artery ultrasound showed no evidence of RAS bilaterally.  Rennin aldosterone and plasma metanephrines were normal.     She was seen in the office in 11/2021 and noted improvement in BP readings at home with most readings in the 347Q systolic.  She was adjusting to some fatigue associated with carvedilol.  She seen in the office on 05/14/2022 and continued to note fatigue following the initiation and subsequent titration of carvedilol.  Blood pressure readings at home were in the 259D to 638V systolic.  She preferred to discontinue carvedilol.  Despite discontinuation of ACE inhibitor/ARB she continued to note a cough.  Blood pressure in the office was 140/80.  Carvedilol was discontinued and she was initiated on spironolactone 25 mg daily.  She  was last seen in the office on 06/08/2022 and remained without symptoms of angina or cardiac decompensation.  She noted an improvement in her fatigue following discontinuation of carvedilol and felt like her energy was back to baseline.  Blood pressure the initiation of spironolactone she did continue to add  salt to food.  Cough persisted despite discontinuation of ACE inhibitor and ARB and has been present since her COVID illness.  She also reported a recent infection with RSV which she felt like was contributing to her cough.  Blood pressure in the office was 166/82, spironolactone was titrated to 25 mg twice daily.   She comes in doing well from a cardiac perspective and is without symptoms of angina or decompensation.  No dyspnea, palpitations, dizziness, presyncope, or syncope.  No lower extremity swelling or progressive orthopnea.  Blood pressure at home is typically in the 140s over 80s.  She does continue to note a mild cough that she attributes to postnasal drip.  She is willing to retry ARB.  No acute cardiac concerns at this time.   Labs independently reviewed: 06/2022 - A1c 6.4, potassium 4.0, BUN 22, SCr 0.84, TSH 0.110, free T4 normal, TC 242, TG 168, HDL 48, LDL 163 03/2021 - albumin 4.4, AST/ALT normal  Past Medical History:  Diagnosis Date   Allergic rhinitis    Allergy    Aortic atherosclerosis ([place])    Diabetes mellitus, type 2 ([place])    no medicines- was pre diabtes- diet controlled    Diverticulitis    Essential hypertension    GERD (gastroesophageal reflux disease)    Hyperlipidemia    Hypertension    Hypothyroidism    Obesity    Osteoporosis    Sleep apnea    mild -no cpap   Transient insomnia    Tubular adenoma of colon 05/2013    Past Surgical History:  Procedure Laterality Date   APPENDECTOMY     BUNIONECTOMY     right foot   [facility]    CATARACT EXTRACTION, BILATERAL Left 10/20/2017   CATARACT EXTRACTION, BILATERAL Right 10/27/2017   CHOLECYSTECTOMY     COLONOSCOPY  05/25/2016   Dr.Stark   COLONOSCOPY     March 2023   HAMMER TOE SURGERY  12/2012   left toe   PARTIAL HYSTERECTOMY     POLYPECTOMY     ROBOTIC ASSISTED SALPINGO OOPHERECTOMY     right ovary first removed then left ovary removed years later    TONSILLECTOMY  age 44   TOTAL THYROIDECTOMY  09/2011    Current Medications: Current Meds  Medication Sig   aspirin 81 MG tablet Take 81 mg by mouth daily.   Calcium Carbonate-Vitamin D (CALCIUM + D PO) Take 1 tablet by mouth daily.    cetirizine (ZYRTEC) 10 MG tablet Take 10 mg by mouth as needed.    chlorthalidone (HYGROTON) 25 MG tablet TAKE 1 TABLET BY MOUTH ONCE A DAY   cholecalciferol (VITAMIN D) 1000 units tablet Take 1,000 Units by mouth daily.   ezetimibe (ZETIA) 10 MG tablet TAKE 1 TABLET BY MOUTH EVERY DAY   fenofibrate 54 MG tablet TAKE 1 TABLET BY MOUTH EVERY DAY FOR ELEVATED CHOLESTEROL   losartan (COZAAR) 25 MG tablet Take 1 tablet (25 mg total) by mouth daily.   Omega-3 Fatty Acids (OMEGA 3 PO) Take 1 capsule by mouth daily.   pantoprazole (PROTONIX) 40 MG tablet Take 1 tablet (40 mg total) by mouth daily. t  spironolactone (ALDACTONE) 25 MG tablet Take 1 tablet (25 mg) by mouth twice daily   SYNTHROID 112 MCG tablet TAKE 1 TABLET BY MOUTH EVERY MORNING    Allergies:   Cefaclor, Penicillins, and Talwin [pentazocine]   Social History   Socioeconomic History   Marital status: Married    Spouse name: Not on file   Number of children: 2   Years of education: Not on file   Highest education level: Not on file  Occupational History   Occupation: Retired  Tobacco Use   Smoking status: Former    Packs/day: 1.00    Years: 30.00    Total pack years: 30.00    Types: Cigarettes    Quit date: 07/06/1998    Years since quitting: 24.1   Smokeless tobacco: Never  Vaping Use   Vaping Use: Never used  Substance and Sexual Activity   Alcohol use: No    Alcohol/week: 0.0 standard drinks of alcohol   Drug use: No   Sexual activity: Not on file  Other Topics Concern   Not on file  Social History Narrative   Not on file   Social Determinants of Health   Financial Resource Strain: Low Risk  (12/24/2020)   Overall Financial Resource Strain (CARDIA)    Difficulty of  Paying Living Expenses: Not hard at all  Food Insecurity: Not on file  Transportation Needs: Not on file  Physical Activity: Not on file  Stress: Not on file  Social Connections: Not on file     Family History:  The patient's family history includes Breast cancer in her cousin; Colon cancer in her maternal aunt and maternal uncle; Colon polyps in her maternal aunt and maternal uncle; Esophageal cancer in her father; Heart disease in her maternal uncle; Prostate cancer in her maternal uncle; Stomach cancer in her maternal uncle; Throat cancer in her father. There is no history of Diabetes, Kidney disease, Gallbladder disease, or Rectal cancer.  ROS:   12-point review of systems is negative unless otherwise noted in the HPI.   EKGs/Labs/Other Studies Reviewed:    Studies reviewed were summarized above. The additional studies were reviewed today:  Renal artery ultrasound 10/10/2021: Renal:    Right: Normal size right kidney. Normal right Resisitive Index.         Normal cortical thickness of right kidney. RRV flow present.         No evidence of right renal artery stenosis.  Left:  LRV flow present. No evidence of left renal artery stenosis.         Normal size of left kidney. Normal left Resistive Index.         Normal cortical thickness of the left kidney.  Mesenteric:  Normal Celiac artery and Superior Mesenteric artery findings.  __________  2D echo 09/30/2021: 1. Left ventricular ejection fraction, by estimation, is 60 to 65%. The  left ventricle has normal function. The left ventricle has no regional  wall motion abnormalities. Left ventricular diastolic parameters are  consistent with Grade I diastolic  dysfunction (impaired relaxation).   2. Right ventricular systolic function is normal. The right ventricular  size is normal.   3. The mitral valve is normal in structure. Mild mitral valve  regurgitation. No evidence of mitral stenosis.   4. The aortic valve is tricuspid.  Aortic valve regurgitation is not  visualized. No aortic stenosis is present.   5. The inferior vena cava is normal in size with greater than 50%  respiratory variability, suggesting  right atrial pressure of 3 mmHg. __________   Carlton Adam MPI 12/19/2014: Pharmacological myocardial perfusison imaging study with no significnat ischemia. The left ventricular ejection fraction is hyperdynamic (>65%). No wall motion abnormality noted. No EKG changes concerning for ischemia. This is a low risk study.   EKG:  EKG is not ordered today.    Recent Labs: 06/22/2022: TSH 0.110 06/23/2022: BUN 22; Creatinine, Ser 0.84; Potassium 4.0; Sodium 138  Recent Lipid Panel    Component Value Date/Time   CHOL 242 (H) 06/22/2022 0842   TRIG 168 (H) 06/22/2022 0842   HDL 48 06/22/2022 0842   CHOLHDL 5.0 (H) 06/22/2022 0842   LDLCALC 163 (H) 06/22/2022 0842    PHYSICAL EXAM:    VS:  BP (!) 148/80 (BP Location: Left Arm, Patient Position: Sitting, Cuff Size: Normal)   Pulse 68   Ht 5' 6.5" (1.689 m)   Wt 164 lb 3.2 oz (74.5 kg)   SpO2 96%   BMI 26.11 kg/m   BMI: Body mass index is 26.11 kg/m.  Physical Exam Vitals reviewed.  Constitutional:      Appearance: She is well-developed.  HENT:     Head: Normocephalic and atraumatic.  Eyes:     General:        Right eye: No discharge.        Left eye: No discharge.  Neck:     Vascular: No JVD.  Cardiovascular:     Rate and Rhythm: Normal rate and regular rhythm.     Pulses:          Posterior tibial pulses are 2+ on the right side and 2+ on the left side.     Heart sounds: S1 normal and S2 normal. Heart sounds not distant. No midsystolic click and no opening snap. Murmur heard.     Systolic murmur is present with a grade of 1/6 at the upper left sternal border.     No friction rub.  Pulmonary:     Effort: Pulmonary effort is normal. No respiratory distress.     Breath sounds: Normal breath sounds. No decreased breath sounds, wheezing or rales.   Chest:     Chest wall: No tenderness.  Abdominal:     General: There is no distension.  Musculoskeletal:     Cervical back: Normal range of motion.     Right lower leg: No edema.     Left lower leg: No edema.  Skin:    General: Skin is warm and dry.     Nails: There is no clubbing.  Neurological:     Mental Status: She is alert and oriented to person, place, and time.  Psychiatric:        Speech: Speech normal.        Behavior: Behavior normal.        Thought Content: Thought content normal.        Judgment: Judgment normal.     Wt Readings from Last 3 Encounters:  08/11/22 164 lb 3.2 oz (74.5 kg)  06/23/22 160 lb 12.8 oz (72.9 kg)  06/08/22 160 lb 3.2 oz (72.7 kg)     ASSESSMENT & PLAN:   HTN: Blood pressure is improved, though does remain mildly elevated.  We will rechallenge her with losartan 25 mg daily with continuation of spironolactone 25 mg twice daily and chlorthalidone 25 mg daily.  Check BMP 1 week after reinitiating ARB.  Low-sodium diet encouraged.  No longer on calcium channel blocker secondary to lower extremity swelling.  Intolerant to beta-blocker secondary to bradycardia and fatigue.  Aortic atherosclerosis/HLD: LDL 168 with a triglyceride of 163.  Intolerant to statins secondary to abnormal LFT.  She remains on ezetimibe and fenofibrate.  Managed by PCP.  Cough: Has been present since COVID illness.  Did not improve following discontinuation of ACE inhibitor/ARB.  Follow-up with PCP.   Disposition: F/u with Dr. Fletcher Anon or an APP in 2 months.   Medication Adjustments/Labs and Tests Ordered: Current medicines are reviewed at length with the patient today.  Concerns regarding medicines are outlined above. Medication changes, Labs and Tests ordered today are summarized above and listed in the Patient Instructions accessible in Encounters.   Signed, Christell Faith, PA-C 08/11/2022 9:12 AM     [address] [place], [place] [address] [phone]

## 2022-08-11 ENCOUNTER — Ambulatory Visit: Payer: PPO | Attending: Physician Assistant | Admitting: Physician Assistant

## 2022-08-11 ENCOUNTER — Encounter: Payer: Self-pay | Admitting: Physician Assistant

## 2022-08-11 VITALS — BP 148/80 | HR 68 | Ht 66.5 in | Wt 164.2 lb

## 2022-08-11 DIAGNOSIS — E785 Hyperlipidemia, unspecified: Secondary | ICD-10-CM

## 2022-08-11 DIAGNOSIS — I700 Atherosclerosis of aorta: Secondary | ICD-10-CM | POA: Diagnosis not present

## 2022-08-11 DIAGNOSIS — R059 Cough, unspecified: Secondary | ICD-10-CM

## 2022-08-11 DIAGNOSIS — I10 Essential (primary) hypertension: Secondary | ICD-10-CM | POA: Diagnosis not present

## 2022-08-11 MED ORDER — LOSARTAN POTASSIUM 25 MG PO TABS: 25.0000 mg | ORAL_TABLET | Freq: Every day | ORAL | 3 refills | Status: DC

## 2022-08-11 NOTE — Patient Instructions (Signed)
Medication Instructions:  Your physician has recommended you make the following change in your medication:   START Losartan 25 mg once daily   *If you need a refill on your cardiac medications before your next appointment, please call your pharmacy*   Lab Work: BMET in one week. No appointment is needed to have this done. Just go to the following location and check in at registration:   [place] [facility] 1st desk on the right to check in (REGISTRATION)  Lab hours: Monday- Friday (7:30 am- 5:30 pm)  If you have labs (blood work) drawn today and your tests are completely normal, you will receive your results only by: MyChart Message (if you have MyChart) OR A paper copy in the mail If you have any lab test that is abnormal or we need to change your treatment, we will call you to review the results.   Testing/Procedures: None   Follow-Up: At [facility], you and your health needs are our priority.  As part of our continuing mission to provide you with exceptional heart care, we have created designated Provider Care Teams.  These Care Teams include your primary Cardiologist (physician) and Advanced Practice Providers (APPs -  Physician Assistants and Nurse Practitioners) who all work together to provide you with the care you need, when you need it.  We recommend signing up for the patient portal called "MyChart".  Sign up information is provided on this After Visit Summary.  MyChart is used to connect with patients for Virtual Visits (Telemedicine).  Patients are able to view lab/test results, encounter notes, upcoming appointments, etc.  Non-urgent messages can be sent to your provider as well.   To learn more about what you can do with MyChart, go to [email or web address].    Your next appointment:   2 month(s)  Provider:   Kathlyn Sacramento, MD or Christell Faith, PA-C

## 2022-08-18 ENCOUNTER — Other Ambulatory Visit
Admission: RE | Admit: 2022-08-18 | Discharge: 2022-08-18 | Disposition: A | Discharge status: Home / Self Care | Payer: PPO | Attending: Physician Assistant | Admitting: Physician Assistant

## 2022-08-18 DIAGNOSIS — E785 Hyperlipidemia, unspecified: Secondary | ICD-10-CM | POA: Insufficient documentation

## 2022-08-18 DIAGNOSIS — I10 Essential (primary) hypertension: Secondary | ICD-10-CM | POA: Diagnosis not present

## 2022-08-18 DIAGNOSIS — I700 Atherosclerosis of aorta: Secondary | ICD-10-CM | POA: Insufficient documentation

## 2022-08-18 LAB — BASIC METABOLIC PANEL
Anion gap: 11 (ref 5–15)
BUN: 18 mg/dL (ref 8–23)
CO2: 27 mmol/L (ref 22–32)
Calcium: 9.6 mg/dL (ref 8.9–10.3)
Chloride: 99 mmol/L (ref 98–111)
Creatinine, Ser: 0.94 mg/dL (ref 0.44–1.00)
GFR, Estimated: >60 mL/min (ref >60)
Glucose, Bld: 153 mg/dL — ABNORMAL HIGH (ref 70–99)
Potassium: 3.9 mmol/L (ref 3.5–5.1)
Sodium: 137 mmol/L (ref 135–145)

## 2022-08-24 ENCOUNTER — Other Ambulatory Visit: Payer: Self-pay | Admitting: Nurse Practitioner

## 2022-09-16 ENCOUNTER — Encounter: Payer: Self-pay | Admitting: Nurse Practitioner

## 2022-09-16 ENCOUNTER — Other Ambulatory Visit: Payer: Self-pay | Admitting: Nurse Practitioner

## 2022-09-16 ENCOUNTER — Telehealth (INDEPENDENT_AMBULATORY_CARE_PROVIDER_SITE_OTHER): Payer: PPO | Admitting: Nurse Practitioner

## 2022-09-16 VITALS — Ht 66.5 in | Wt 161.0 lb

## 2022-09-16 DIAGNOSIS — R051 Acute cough: Secondary | ICD-10-CM

## 2022-09-16 DIAGNOSIS — J0110 Acute frontal sinusitis, unspecified: Secondary | ICD-10-CM

## 2022-09-16 MED ORDER — DOXYCYCLINE HYCLATE 100 MG PO TABS: 100.0000 mg | ORAL_TABLET | Freq: Two times a day (BID) | ORAL | 0 refills | Status: DC

## 2022-09-16 MED ORDER — HYDROCOD POLI-CHLORPHE POLI ER 10-8 MG/5ML PO SUER: 5.0000 mL | Freq: Two times a day (BID) | ORAL | 0 refills | Status: DC | PRN

## 2022-09-16 NOTE — Progress Notes (Signed)
[facility] [place], [place] [address]  Internal MEDICINE  Telephone Visit  Patient Name: Toni [name]  [id number]  [id number]  Date of Service: 09/16/2022  I connected with the patient at 1500 by telephone and verified the patients identity using two identifiers.   I discussed the limitations, risks, security and privacy concerns of performing an evaluation and management service by telephone and the availability of in person appointments. I also discussed with the patient that there may be a patient responsible charge related to the service.  The patient expressed understanding and agrees to proceed.    Chief Complaint  Patient presents with   Telephone Assessment    [id number]   Telephone Screen   Sinusitis   Cough    HPI Toni [name] presents for a telehealth virtual visit for symptoms of sinusitis.  Onset of symptoms about 5 days ago Reports cough, sore throat, low grade fever, sinus pressure and pain, congestion   Current Medication: Outpatient Encounter Medications as of 09/16/2022  Medication Sig   aspirin 81 MG tablet Take 81 mg by mouth daily.   Calcium Carbonate-Vitamin D (CALCIUM + D PO) Take 1 tablet by mouth daily.    cetirizine (ZYRTEC) 10 MG tablet Take 10 mg by mouth as needed.    chlorpheniramine-HYDROcodone (TUSSIONEX) 10-8 MG/5ML Take 5 mLs by mouth every 12 (twelve) hours as needed for cough.   chlorthalidone (HYGROTON) 25 MG tablet TAKE 1 TABLET BY MOUTH ONCE A DAY   cholecalciferol (VITAMIN D) 1000 units tablet Take 1,000 Units by mouth daily.   doxycycline (VIBRA-TABS) 100 MG tablet Take 1 tablet (100 mg total) by mouth 2 (two) times daily. Take with food.   ezetimibe (ZETIA) 10 MG tablet TAKE 1 TABLET BY MOUTH EVERY DAY   fenofibrate 54 MG tablet TAKE 1 TABLET BY MOUTH EVERY DAY FOR ELEVATED CHOLESTEROL   losartan (COZAAR) 25 MG tablet Take 1 tablet (25 mg total) by mouth daily.   Omega-3 Fatty Acids (OMEGA 3 PO) Take 1 capsule by  mouth daily.   pantoprazole (PROTONIX) 40 MG tablet Take 1 tablet (40 mg total) by mouth daily. t   spironolactone (ALDACTONE) 25 MG tablet Take 1 tablet (25 mg) by mouth twice daily   SYNTHROID 112 MCG tablet TAKE 1 TABLET BY MOUTH EVERY MORNING   No facility-administered encounter medications on file as of 09/16/2022.    Surgical History: Past Surgical History:  Procedure Laterality Date   APPENDECTOMY     BUNIONECTOMY     right foot   [facility]    CATARACT EXTRACTION, BILATERAL Left 10/20/2017   CATARACT EXTRACTION, BILATERAL Right 10/27/2017   CHOLECYSTECTOMY     COLONOSCOPY  05/25/2016   Dr.Stark   COLONOSCOPY     March 2023   HAMMER TOE SURGERY  12/2012   left toe   PARTIAL HYSTERECTOMY     POLYPECTOMY     ROBOTIC ASSISTED SALPINGO OOPHERECTOMY     right ovary first removed then left ovary removed years later   TONSILLECTOMY  age 70   TOTAL THYROIDECTOMY  09/2011    Medical History: Past Medical History:  Diagnosis Date   Allergic rhinitis    Allergy    Aortic atherosclerosis (HCC)    Diabetes mellitus, type 2 (HCC)    no medicines- was pre diabtes- diet controlled    Diverticulitis    Essential hypertension    GERD (gastroesophageal reflux disease)    Hyperlipidemia  Hypertension    Hypothyroidism    Obesity    Osteoporosis    Sleep apnea    mild -no cpap   Transient insomnia    Tubular adenoma of colon 05/2013    Family History: Family History  Problem Relation Age of Onset   Throat cancer Father    Esophageal cancer Father    Colon cancer Maternal Aunt    Colon polyps Maternal Aunt    Colon cancer Maternal Uncle    Colon polyps Maternal Uncle    Heart disease Maternal Uncle    Stomach cancer Maternal Uncle    Prostate cancer Maternal Uncle    Breast cancer Cousin    Diabetes Neg Hx    Kidney disease Neg Hx    Gallbladder disease Neg Hx    Rectal cancer Neg Hx     Social History   Socioeconomic  History   Marital status: Married    Spouse name: Not on file   Number of children: 2   Years of education: Not on file   Highest education level: Not on file  Occupational History   Occupation: Retired  Tobacco Use   Smoking status: Former    Packs/day: 1.00    Years: 30.00    Total pack years: 30.00    Types: Cigarettes    Quit date: 07/06/1998    Years since quitting: 24.2   Smokeless tobacco: Never  Vaping Use   Vaping Use: Never used  Substance and Sexual Activity   Alcohol use: No    Alcohol/week: 0.0 standard drinks of alcohol   Drug use: No   Sexual activity: Not on file  Other Topics Concern   Not on file  Social History Narrative   Not on file   Social Determinants of Health   Financial Resource Strain: Low Risk  (12/24/2020)   Overall Financial Resource Strain (CARDIA)    Difficulty of Paying Living Expenses: Not hard at all  Food Insecurity: Not on file  Transportation Needs: Not on file  Physical Activity: Not on file  Stress: Not on file  Social Connections: Not on file  Intimate Partner Violence: Not on file      Review of Systems  Constitutional:  Positive for fatigue.  HENT:  Positive for congestion, postnasal drip, rhinorrhea, sinus pressure, sinus pain and sore throat.   Respiratory:  Positive for cough. Negative for chest tightness, shortness of breath and wheezing.   Cardiovascular: Negative.  Negative for chest pain and palpitations.  Gastrointestinal:  Negative for abdominal pain, constipation, diarrhea, nausea and vomiting.  Neurological:  Positive for headaches.    Vital Signs: Ht 5' 6.5" (1.689 m)   Wt 161 lb (73 kg)   BMI 25.60 kg/m    Observation/Objective: She is alert and oriented and engages in conversation appropriately. No acute distress noted.     Assessment/Plan: 1. Acute non-recurrent frontal sinusitis Empiric antibiotic treatment prescribed.  - doxycycline (VIBRA-TABS) 100 MG tablet; Take 1 tablet (100 mg total) by  mouth 2 (two) times daily. Take with food.  Dispense: 20 tablet; Refill: 0  2. Acute cough Medication for cough prescribed.  - chlorpheniramine-HYDROcodone (TUSSIONEX) 10-8 MG/5ML; Take 5 mLs by mouth every 12 (twelve) hours as needed for cough.  Dispense: 70 mL; Refill: 0   General Counseling: Thien verbalizes understanding of the findings of today's phone visit and agrees with plan of treatment. I have discussed any further diagnostic evaluation that may be needed or ordered today. We also reviewed her  medications today. she has been encouraged to call the office with any questions or concerns that should arise related to todays visit.  Return if symptoms worsen or fail to improve.   No orders of the defined types were placed in this encounter.   Meds ordered this encounter  Medications   chlorpheniramine-HYDROcodone (TUSSIONEX) 10-8 MG/5ML    Sig: Take 5 mLs by mouth every 12 (twelve) hours as needed for cough.    Dispense:  70 mL    Refill:  0    Fill script today   doxycycline (VIBRA-TABS) 100 MG tablet    Sig: Take 1 tablet (100 mg total) by mouth 2 (two) times daily. Take with food.    Dispense:  20 tablet    Refill:  0    Time spent:10 Minutes Time spent with patient included reviewing progress notes, labs, imaging studies, and discussing plan for follow up.  [place] Controlled Substance Database was reviewed by me for overdose risk score (ORS) if appropriate.  This patient was seen by [name], FNP-C in collaboration with Dr. Clayborn Bigness as a part of collaborative care agreement.  Yaslin Kirtley R. Valetta Fuller, MSN, FNP-C Internal medicine

## 2022-09-28 ENCOUNTER — Other Ambulatory Visit: Payer: Self-pay | Admitting: Nurse Practitioner

## 2022-09-28 DIAGNOSIS — E782 Mixed hyperlipidemia: Secondary | ICD-10-CM

## 2022-09-29 ENCOUNTER — Ambulatory Visit: Payer: PPO | Admitting: Nurse Practitioner

## 2022-10-05 ENCOUNTER — Ambulatory Visit (INDEPENDENT_AMBULATORY_CARE_PROVIDER_SITE_OTHER): Payer: PPO | Admitting: Nurse Practitioner

## 2022-10-05 ENCOUNTER — Encounter: Payer: Self-pay | Admitting: Nurse Practitioner

## 2022-10-05 ENCOUNTER — Telehealth: Payer: Self-pay | Admitting: Nurse Practitioner

## 2022-10-05 VITALS — BP 144/75 | HR 75 | Temp 97.1°F | Resp 16 | Ht 66.5 in | Wt 162.8 lb

## 2022-10-05 DIAGNOSIS — I1A0 Resistant hypertension: Secondary | ICD-10-CM

## 2022-10-05 DIAGNOSIS — R053 Chronic cough: Secondary | ICD-10-CM

## 2022-10-05 DIAGNOSIS — E782 Mixed hyperlipidemia: Secondary | ICD-10-CM | POA: Diagnosis not present

## 2022-10-05 DIAGNOSIS — E119 Type 2 diabetes mellitus without complications: Secondary | ICD-10-CM | POA: Diagnosis not present

## 2022-10-05 DIAGNOSIS — Z87891 Personal history of nicotine dependence: Secondary | ICD-10-CM

## 2022-10-05 LAB — POCT GLYCOSYLATED HEMOGLOBIN (HGB A1C): Hemoglobin A1C: 6.8 % — AB (ref 4.0–5.6)

## 2022-10-05 NOTE — Progress Notes (Signed)
[facility] [place], [place] [address]  Internal MEDICINE  Office Visit Note  Patient Name: Toni [name]  [id number]  [id number]  Date of Service: 10/05/2022  Chief Complaint  Patient presents with   Diabetes    A1c check   Hypertension    HPI Toni [name] presents for a follow-up visit for diabetes, hypertension and chronic cough Diabetes -- slightly increased but still under 7.0.  Hypertension -- stable, sees cardiology --spironolactone and losartan, chlorthalidone.  Chronic cough -- over 2 years now. No recent imaging. Last chest xray was done in 2021. Ruled out ACEi/ARB meds, GERD is controlled with pantoprazole. Does have some environmental allergies. Is a former smoker.  Former smoker 1ppd for 25-30 years.    Current Medication: Outpatient Encounter Medications as of 10/05/2022  Medication Sig   aspirin 81 MG tablet Take 81 mg by mouth daily.   Calcium Carbonate-Vitamin D (CALCIUM + D PO) Take 1 tablet by mouth daily.    cetirizine (ZYRTEC) 10 MG tablet Take 10 mg by mouth as needed.    chlorpheniramine-HYDROcodone (TUSSIONEX) 10-8 MG/5ML Take 5 mLs by mouth every 12 (twelve) hours as needed for cough.   chlorthalidone (HYGROTON) 25 MG tablet TAKE 1 TABLET BY MOUTH ONCE A DAY   cholecalciferol (VITAMIN D) 1000 units tablet Take 1,000 Units by mouth daily.   doxycycline (VIBRA-TABS) 100 MG tablet Take 1 tablet (100 mg total) by mouth 2 (two) times daily. Take with food.   ezetimibe (ZETIA) 10 MG tablet TAKE ONE TABLET BY MOUTH EVERY DAY   fenofibrate 54 MG tablet TAKE ONE TABLET BY MOUTH EVERY DAY FOR ELEVATED CHOLESTEROL   losartan (COZAAR) 25 MG tablet Take 1 tablet (25 mg total) by mouth daily.   Omega-3 Fatty Acids (OMEGA 3 PO) Take 1 capsule by mouth daily.   pantoprazole (PROTONIX) 40 MG tablet TAKE ONE TABLET BY MOUTH ONCE A DAY   spironolactone (ALDACTONE) 25 MG tablet Take 1 tablet (25 mg) by mouth twice daily   SYNTHROID 112 MCG tablet TAKE 1  TABLET BY MOUTH EVERY MORNING   No facility-administered encounter medications on file as of 10/05/2022.    Surgical History: Past Surgical History:  Procedure Laterality Date   APPENDECTOMY     BUNIONECTOMY     right foot   [facility]    CATARACT EXTRACTION, BILATERAL Left 10/20/2017   CATARACT EXTRACTION, BILATERAL Right 10/27/2017   CHOLECYSTECTOMY     COLONOSCOPY  05/25/2016   Dr.Stark   COLONOSCOPY     March 2023   HAMMER TOE SURGERY  12/2012   left toe   PARTIAL HYSTERECTOMY     POLYPECTOMY     ROBOTIC ASSISTED SALPINGO OOPHERECTOMY     right ovary first removed then left ovary removed years later   TONSILLECTOMY  age 77   TOTAL THYROIDECTOMY  09/2011    Medical History: Past Medical History:  Diagnosis Date   Allergic rhinitis    Allergy    Aortic atherosclerosis    Diabetes mellitus, type 2    no medicines- was pre diabtes- diet controlled    Diverticulitis    Essential hypertension    GERD (gastroesophageal reflux disease)    Hyperlipidemia    Hypertension    Hypothyroidism    Obesity    Osteoporosis    Sleep apnea    mild -no cpap   Transient insomnia    Tubular adenoma of colon 05/2013  Family History: Family History  Problem Relation Age of Onset   Throat cancer Father    Esophageal cancer Father    Colon cancer Maternal Aunt    Colon polyps Maternal Aunt    Colon cancer Maternal Uncle    Colon polyps Maternal Uncle    Heart disease Maternal Uncle    Stomach cancer Maternal Uncle    Prostate cancer Maternal Uncle    Breast cancer Cousin    Diabetes Neg Hx    Kidney disease Neg Hx    Gallbladder disease Neg Hx    Rectal cancer Neg Hx     Social History   Socioeconomic History   Marital status: Married    Spouse name: Not on file   Number of children: 2   Years of education: Not on file   Highest education level: Not on file  Occupational History   Occupation: Retired  Tobacco Use   Smoking  status: Former    Packs/day: 1.00    Years: 30.00    Additional pack years: 0.00    Total pack years: 30.00    Types: Cigarettes    Quit date: 07/06/1998    Years since quitting: 24.2   Smokeless tobacco: Never  Vaping Use   Vaping Use: Never used  Substance and Sexual Activity   Alcohol use: No    Alcohol/week: 0.0 standard drinks of alcohol   Drug use: No   Sexual activity: Not on file  Other Topics Concern   Not on file  Social History Narrative   Not on file   Social Determinants of Health   Financial Resource Strain: Low Risk  (12/24/2020)   Overall Financial Resource Strain (CARDIA)    Difficulty of Paying Living Expenses: Not hard at all  Food Insecurity: Not on file  Transportation Needs: Not on file  Physical Activity: Not on file  Stress: Not on file  Social Connections: Not on file  Intimate Partner Violence: Not on file      Review of Systems  Constitutional:  Negative for activity change, appetite change, chills, fatigue, fever and unexpected weight change.  HENT: Negative.  Negative for congestion, ear pain, rhinorrhea, sore throat and trouble swallowing.   Eyes: Negative.   Respiratory:  Positive for cough (chronic). Negative for chest tightness, shortness of breath and wheezing.   Cardiovascular: Negative.  Negative for chest pain.  Gastrointestinal: Negative.  Negative for abdominal pain, blood in stool, constipation, diarrhea, nausea and vomiting.  Endocrine: Negative.   Genitourinary: Negative.  Negative for difficulty urinating, dysuria, frequency, hematuria and urgency.  Musculoskeletal:  Positive for arthralgias. Negative for back pain, joint swelling, myalgias and neck pain.  Skin: Negative.  Negative for rash and wound.  Allergic/Immunologic: Negative.  Negative for immunocompromised state.  Neurological: Negative.  Negative for dizziness, seizures, numbness and headaches.  Hematological: Negative.   Psychiatric/Behavioral: Negative.  Negative  for behavioral problems, self-injury and suicidal ideas. The patient is not nervous/anxious.     Vital Signs: BP (!) 144/75   Pulse 75   Temp (!) 97.1 F (36.2 C)   Resp 16   Ht 5' 6.5" (1.689 m)   Wt 162 lb 12.8 oz (73.8 kg)   SpO2 94%   BMI 25.88 kg/m    Physical Exam Vitals reviewed.  Constitutional:      General: She is not in acute distress.    Appearance: Normal appearance. She is not ill-appearing.  HENT:     Head: Normocephalic and atraumatic.  Nose: No rhinorrhea.     Mouth/Throat:     Mouth: Mucous membranes are moist.     Pharynx: Oropharynx is clear. No oropharyngeal exudate or posterior oropharyngeal erythema.     Comments: Some cobblestoning of the pharynx Eyes:     Pupils: Pupils are equal, round, and reactive to light.  Cardiovascular:     Rate and Rhythm: Normal rate and regular rhythm.  Pulmonary:     Effort: Pulmonary effort is normal. No respiratory distress.  Neurological:     Mental Status: She is alert and oriented to person, place, and time.  Psychiatric:        Mood and Affect: Mood normal.        Behavior: Behavior normal.        Assessment/Plan: 1. Diet-controlled type 2 diabetes mellitus A1c slightly elevated but still within goal. Will repeat A1c in 4 months.  - POCT glycosylated hemoglobin (Hb A1C)  2. Resistant hypertension Fairly stable, followed by cardiology.   3. Chronic cough Chronic cough, with history of smoking, due for lung cancer screening.  - CT CHEST LUNG CA SCREEN LOW DOSE W/O CM; Future  4. Mixed hyperlipidemia Continue ezetimibe, fenofibrate and OTC fish oil supplement as prescribed.   5. Stopped smoking with greater than 25 pack year history Low dose CT chest ordered.  - CT CHEST LUNG CA SCREEN LOW DOSE W/O CM; Future   General Counseling: Toni [name] understanding of the findings of todays visit and agrees with plan of treatment. I have discussed any further diagnostic evaluation that may be  needed or ordered today. We also reviewed her medications today. she has been encouraged to call the office with any questions or concerns that should arise related to todays visit.    Orders Placed This Encounter  Procedures   CT CHEST LUNG CA SCREEN LOW DOSE W/O CM   POCT glycosylated hemoglobin (Hb A1C)    No orders of the defined types were placed in this encounter.   Return in about 4 months (around 02/04/2023) for F/U, Recheck A1C, Christyan Reger PCP.   Total time spent:30 Minutes Time spent includes review of chart, medications, test results, and follow up plan with the patient.   [name] Controlled Substance Database was reviewed by me.  This patient was seen by [name], FNP-C in collaboration with Dr. Clayborn Bigness as a part of collaborative care agreement.   Rasheem Figiel R. Valetta Fuller, MSN, FNP-C Internal medicine

## 2022-10-05 NOTE — Telephone Encounter (Signed)
Notified patient that CT order was faxed to [facility]

## 2022-10-12 ENCOUNTER — Ambulatory Visit: Payer: PPO | Admitting: Physician Assistant

## 2022-10-12 NOTE — Progress Notes (Unsigned)
Cardiology Office Note    Date:  10/13/2022   ID:  Toni, [name] March 08, 1946, MRN [id number]  PCP:  Sallyanne Kuster, NP  Cardiologist:  Lorine Bears, MD  Electrophysiologist:  None   Chief Complaint: Follow-up  History of Present Illness:   Toni [name] is a 77 y.o. female with history of aortic atherosclerosis, DM2, HTN, HLD, hypothyroidism, obesity, and mild sleep apnea that does not require CPAP usage who presents for follow-up of HTN.   She was previously evaluated by Dr. Kirke Corin in 2016 for atypical chest pain.  CT of the abdomen showed aortic atherosclerosis.  Carotid Doppler showed mild intimal thickening with calcified plaque bilaterally.  ABI was normal bilaterally.  Echo showed normal LV systolic function with no significant valvular abnormalities.  Lexiscan MPI showed no evidence of ischemia with a normal EF and was overall low risk.  She was referred back to Dr. Kirke Corin in 09/2021 for evaluation of hypertension with intolerance to multiple medications including ACE inhibitor and ARB, due to dry cough.  It was noted she had previously been on amlodipine and diltiazem, though these did not adequately control her blood pressure.  She had previously been on beta-blocker for migraines, though this led to bradycardia.  She was subsequently placed on nifedipine, though noted increased lower extremity swelling involving the left leg with this.  She had been on HCTZ for quite a while and was tolerating this medication.  She reported her blood pressure became uncontrolled over the preceding year.  There was some mild exertional dyspnea and fatigue.  She was without chest pain.  Blood pressure in the office was elevated at 152/80.  Elevated BP was felt to likely be essential in etiology.  Given lower extremity swelling, nifedipine was stopped, HCTZ was transitioned to chlorthalidone, and carvedilol was added.  Given fatigue and dyspnea, as well as murmur suggestive of aortic sclerosis on  exam, she underwent echo on 09/30/2021 which demonstrated an EF of 60 to 65%, no regional wall motion abnormalities, grade 1 diastolic dysfunction, normal RV systolic function and ventricular cavity size, mild mitral regurgitation, and an estimated right atrial pressure of 3 mmHg.  She was seen in follow up on 10/07/2021 and was doing well.  Since initiating carvedilol and transitioning HCTZ to chlorthalidone, she continued to note elevated BP readings typically in the 150s to 170s systolic.  However, since undergoing these medication changes she had only had 1 systolic blood pressure reading greater than 200 mmHg.  She did note some increase in fatigue following the initiation of carvedilol.  Following the discontinuation of nifedipine she noted resolution of lower extremity swelling.  Coreg was titrated to 12.5 mg bid and she was continued on chlorthalidone 25 mg.  Renal artery ultrasound showed no evidence of RAS bilaterally.  Rennin aldosterone and plasma metanephrines were normal.     She was seen in the office in 11/2021 and noted improvement in BP readings at home with most readings in the 140s systolic.  She was adjusting to some fatigue associated with carvedilol.  She was seen in the office on 05/14/2022 and continued to note fatigue following the initiation and subsequent titration of carvedilol.  Blood pressure readings at home were in the 160s to 180s systolic.  She preferred to discontinue carvedilol.  Despite discontinuation of ACE inhibitor/ARB she continued to note a cough.  Blood pressure in the office was 140/80.  Carvedilol was discontinued and she was initiated on spironolactone 25 mg daily.  She was seen in the office on 06/08/2022 and remained without symptoms of angina or cardiac decompensation.  She noted an improvement in her fatigue following discontinuation of carvedilol and felt like her energy was back to baseline.  Cough persisted despite discontinuation of ACE inhibitor and ARB and has  been present since her COVID illness.  She also reported a recent infection with RSV which she felt like was contributing to her cough.  Blood pressure in the office was 166/82, spironolactone was titrated to 25 mg twice daily.   She was last seen in the office in 08/2022 and remained without symptoms of angina or cardiac decompensation.  Blood pressure at home is typically in the 140s over 80s.  We underwent a rechallenge of losartan 25 mg daily with continuation of spironolactone 25 mg twice daily and chlorthalidone 25 mg daily.  Follow-up labs showed stable renal function and potassium.  She comes in doing well from a cardiac perspective and is without symptoms of angina or cardiac decompensation.  No dyspnea, palpitations, dizziness, presyncope, or syncope.  She has tolerated the rechallenge of losartan without worsening cough.  She is tolerating chlorthalidone and lactone with stable labs as outlined below.  She is watching her sodium intake.  Blood pressure at home has largely been in the 140s systolic.  Overall, she is pleased with her blood pressure readings.  No lower extremity swelling or progressive orthopnea.   Labs independently reviewed: 10/2022 - A1c 6.8 08/2022 - potassium 3.9, BUN 18, serum creatinine 0.94 06/2022 - TSH 0.110, free T4 normal, TC 242, TG 168, HDL 48, LDL 163 03/2021 - albumin 4.4, AST/ALT normal  Past Medical History:  Diagnosis Date   Allergic rhinitis    Allergy    Aortic atherosclerosis    Diabetes mellitus, type 2    no medicines- was pre diabtes- diet controlled    Diverticulitis    Essential hypertension    GERD (gastroesophageal reflux disease)    Hyperlipidemia    Hypertension    Hypothyroidism    Obesity    Osteoporosis    Sleep apnea    mild -no cpap   Transient insomnia    Tubular adenoma of colon 05/2013    Past Surgical History:  Procedure Laterality Date   APPENDECTOMY     BUNIONECTOMY     right foot   CARDIAC CATHETERIZATION  1994    [facility]    CATARACT EXTRACTION, BILATERAL Left 10/20/2017   CATARACT EXTRACTION, BILATERAL Right 10/27/2017   CHOLECYSTECTOMY     COLONOSCOPY  05/25/2016   Dr.Stark   COLONOSCOPY     March 2023   HAMMER TOE SURGERY  12/2012   left toe   PARTIAL HYSTERECTOMY     POLYPECTOMY     ROBOTIC ASSISTED SALPINGO OOPHERECTOMY     right ovary first removed then left ovary removed years later   TONSILLECTOMY  age 49   TOTAL THYROIDECTOMY  09/2011    Current Medications: Current Meds  Medication Sig   aspirin 81 MG tablet Take 81 mg by mouth daily.   Calcium Carbonate-Vitamin D (CALCIUM + D PO) Take 1 tablet by mouth daily.    cetirizine (ZYRTEC) 10 MG tablet Take 10 mg by mouth as needed.    chlorthalidone (HYGROTON) 25 MG tablet TAKE 1 TABLET BY MOUTH ONCE A DAY   cholecalciferol (VITAMIN D) 1000 units tablet Take 1,000 Units by mouth daily.   doxycycline (VIBRA-TABS) 100 MG tablet Take 1 tablet (100 mg total) by  mouth 2 (two) times daily. Take with food.   ezetimibe (ZETIA) 10 MG tablet TAKE ONE TABLET BY MOUTH EVERY DAY   fenofibrate 54 MG tablet TAKE ONE TABLET BY MOUTH EVERY DAY FOR ELEVATED CHOLESTEROL   Omega-3 Fatty Acids (OMEGA 3 PO) Take 1 capsule by mouth daily.   pantoprazole (PROTONIX) 40 MG tablet TAKE ONE TABLET BY MOUTH ONCE A DAY   spironolactone (ALDACTONE) 25 MG tablet Take 1 tablet (25 mg) by mouth twice daily   SYNTHROID 112 MCG tablet TAKE 1 TABLET BY MOUTH EVERY MORNING   [DISCONTINUED] losartan (COZAAR) 25 MG tablet Take 1 tablet (25 mg total) by mouth daily.    Allergies:   Cefaclor, Penicillins, and Talwin [pentazocine]   Social History   Socioeconomic History   Marital status: Married    Spouse name: Not on file   Number of children: 2   Years of education: Not on file   Highest education level: Not on file  Occupational History   Occupation: Retired  Tobacco Use   Smoking status: Former    Packs/day: 1.00    Years: 30.00    Additional pack  years: 0.00    Total pack years: 30.00    Types: Cigarettes    Quit date: 07/06/1998    Years since quitting: 24.2   Smokeless tobacco: Never  Vaping Use   Vaping Use: Never used  Substance and Sexual Activity   Alcohol use: No    Alcohol/week: 0.0 standard drinks of alcohol   Drug use: No   Sexual activity: Not on file  Other Topics Concern   Not on file  Social History Narrative   Not on file   Social Determinants of Health   Financial Resource Strain: Low Risk  (12/24/2020)   Overall Financial Resource Strain (CARDIA)    Difficulty of Paying Living Expenses: Not hard at all  Food Insecurity: Not on file  Transportation Needs: Not on file  Physical Activity: Not on file  Stress: Not on file  Social Connections: Not on file     Family History:  The patient's family history includes Breast cancer in her cousin; Colon cancer in her maternal aunt and maternal uncle; Colon polyps in her maternal aunt and maternal uncle; Esophageal cancer in her father; Heart disease in her maternal uncle; Prostate cancer in her maternal uncle; Stomach cancer in her maternal uncle; Throat cancer in her father. There is no history of Diabetes, Kidney disease, Gallbladder disease, or Rectal cancer.  ROS:   12-point review of systems is negative unless otherwise noted in the HPI.   EKGs/Labs/Other Studies Reviewed:    Studies reviewed were summarized above. The additional studies were reviewed today:  Renal artery ultrasound 10/10/2021: Renal:    Right: Normal size right kidney. Normal right Resisitive Index.         Normal cortical thickness of right kidney. RRV flow present.         No evidence of right renal artery stenosis.  Left:  LRV flow present. No evidence of left renal artery stenosis.         Normal size of left kidney. Normal left Resistive Index.         Normal cortical thickness of the left kidney.  Mesenteric:  Normal Celiac artery and Superior Mesenteric artery findings.   __________   2D echo 09/30/2021: 1. Left ventricular ejection fraction, by estimation, is 60 to 65%. The  left ventricle has normal function. The left ventricle has no regional  wall motion abnormalities. Left ventricular diastolic parameters are  consistent with Grade I diastolic  dysfunction (impaired relaxation).   2. Right ventricular systolic function is normal. The right ventricular  size is normal.   3. The mitral valve is normal in structure. Mild mitral valve  regurgitation. No evidence of mitral stenosis.   4. The aortic valve is tricuspid. Aortic valve regurgitation is not  visualized. No aortic stenosis is present.   5. The inferior vena cava is normal in size with greater than 50%  respiratory variability, suggesting right atrial pressure of 3 mmHg. __________   Eugenie Birks MPI 12/19/2014: Pharmacological myocardial perfusison imaging study with no significnat ischemia. The left ventricular ejection fraction is hyperdynamic (>65%). No wall motion abnormality noted. No EKG changes concerning for ischemia. This is a low risk study.   EKG:  EKG is ordered today.  The EKG ordered today demonstrates NSR, 64 bpm, no acute ST-T changes  Recent Labs: 06/22/2022: TSH 0.110 08/18/2022: BUN 18; Creatinine, Ser 0.94; Potassium 3.9; Sodium 137  Recent Lipid Panel    Component Value Date/Time   CHOL 242 (H) 06/22/2022 0842   TRIG 168 (H) 06/22/2022 0842   HDL 48 06/22/2022 0842   CHOLHDL 5.0 (H) 06/22/2022 0842   LDLCALC 163 (H) 06/22/2022 0842    PHYSICAL EXAM:    VS:  BP 134/78   Pulse 64   Ht 5' 6.5" (1.689 m)   Wt 162 lb 9.6 oz (73.8 kg)   SpO2 98%   BMI 25.85 kg/m   BMI: Body mass index is 25.85 kg/m.  Physical Exam Vitals reviewed.  Constitutional:      Appearance: She is well-developed.  HENT:     Head: Normocephalic and atraumatic.  Eyes:     General:        Right eye: No discharge.        Left eye: No discharge.  Neck:     Vascular: No JVD.   Cardiovascular:     Rate and Rhythm: Normal rate and regular rhythm.     Pulses:          Posterior tibial pulses are 2+ on the right side and 2+ on the left side.     Heart sounds: S1 normal and S2 normal. Heart sounds not distant. No midsystolic click and no opening snap. Murmur heard.     Systolic murmur is present with a grade of 1/6 at the upper left sternal border.     No friction rub.  Pulmonary:     Effort: Pulmonary effort is normal. No respiratory distress.     Breath sounds: Normal breath sounds. No decreased breath sounds, wheezing or rales.  Chest:     Chest wall: No tenderness.  Abdominal:     General: There is no distension.  Musculoskeletal:     Cervical back: Normal range of motion.     Right lower leg: No edema.     Left lower leg: No edema.  Skin:    General: Skin is warm and dry.     Nails: There is no clubbing.  Neurological:     Mental Status: She is alert and oriented to person, place, and time.  Psychiatric:        Speech: Speech normal.        Behavior: Behavior normal.        Thought Content: Thought content normal.        Judgment: Judgment normal.     Wt Readings from Last 3  Encounters:  10/13/22 162 lb 9.6 oz (73.8 kg)  10/05/22 162 lb 12.8 oz (73.8 kg)  09/16/22 161 lb (73 kg)     ASSESSMENT & PLAN:   HTN: Blood pressure is significantly improved and reasonably controlled in the office today.  She does continue to have some BP readings in the 140s at home.  In this setting, we will increase losartan to 25 mg twice daily with continuation of chlorthalidone 25 mg daily and spironolactone 25 mg twice daily.  Recent labs stable as outlined above.  Low-sodium diet is recommended.  No longer on calcium channel blocker secondary to lower extremity swelling.  Intolerant to beta-blocker secondary to bradycardia and fatigue.  Aortic atherosclerosis/HLD: LDL 163.  Intolerant to statins secondary to abnormal LFT.  She remains on ezetimibe and fenofibrate.   Managed by PCP.  Cough: Longstanding and stable.  Has been present since COVID illness.  No change of ACE inhibitor/ARB.  She is scheduled for screening chest CT.   Disposition: F/u with Dr. Kirke Corin or an APP in 6 months.   Medication Adjustments/Labs and Tests Ordered: Current medicines are reviewed at length with the patient today.  Concerns regarding medicines are outlined above. Medication changes, Labs and Tests ordered today are summarized above and listed in the Patient Instructions accessible in Encounters.   Signed, Eula Listen, PA-C 10/13/2022 9:56 AM     [facility] [place] [address] [place], [place] [address] [phone]

## 2022-10-13 ENCOUNTER — Ambulatory Visit: Payer: PPO | Attending: Physician Assistant | Admitting: Physician Assistant

## 2022-10-13 ENCOUNTER — Encounter: Payer: Self-pay | Admitting: Physician Assistant

## 2022-10-13 VITALS — BP 134/78 | HR 64 | Ht 66.5 in | Wt 162.6 lb

## 2022-10-13 DIAGNOSIS — R059 Cough, unspecified: Secondary | ICD-10-CM | POA: Diagnosis not present

## 2022-10-13 DIAGNOSIS — I700 Atherosclerosis of aorta: Secondary | ICD-10-CM

## 2022-10-13 DIAGNOSIS — I10 Essential (primary) hypertension: Secondary | ICD-10-CM | POA: Diagnosis not present

## 2022-10-13 DIAGNOSIS — E785 Hyperlipidemia, unspecified: Secondary | ICD-10-CM

## 2022-10-13 MED ORDER — LOSARTAN POTASSIUM 25 MG PO TABS: 25.0000 mg | ORAL_TABLET | Freq: Two times a day (BID) | ORAL | 3 refills | Status: DC

## 2022-10-13 NOTE — Patient Instructions (Addendum)
Medication Instructions:  Your physician has recommended you make the following change in your medication:   INCREASE Losartan to 25 mg twice daily   *If you need a refill on your cardiac medications before your next appointment, please call your pharmacy*   Lab Work: None  If you have labs (blood work) drawn today and your tests are completely normal, you will receive your results only by: MyChart Message (if you have MyChart) OR A paper copy in the mail If you have any lab test that is abnormal or we need to change your treatment, we will call you to review the results.   Testing/Procedures: None   Follow-Up: At [facility], you and your health needs are our priority.  As part of our continuing mission to provide you with exceptional heart care, we have created designated Provider Care Teams.  These Care Teams include your primary Cardiologist (physician) and Advanced Practice Providers (APPs -  Physician Assistants and Nurse Practitioners) who all work together to provide you with the care you need, when you need it.  We recommend signing up for the patient portal called "MyChart".  Sign up information is provided on this After Visit Summary.  MyChart is used to connect with patients for Virtual Visits (Telemedicine).  Patients are able to view lab/test results, encounter notes, upcoming appointments, etc.  Non-urgent messages can be sent to your provider as well.   To learn more about what you can do with MyChart, go to [email or web address].    Your next appointment:   6 month(s)  Provider:   Lorine Bears, MD or Eula Listen, PA-C

## 2022-10-15 ENCOUNTER — Telehealth: Payer: Self-pay | Admitting: Nurse Practitioner

## 2022-10-15 NOTE — Addendum Note (Signed)
Addended by: Sallyanne Kuster on: 10/15/2022 11:09 AM   Modules accepted: Orders

## 2022-10-15 NOTE — Telephone Encounter (Signed)
Lvm to notify patient of CT appointment-Toni 

## 2022-10-22 ENCOUNTER — Ambulatory Visit: Payer: PPO

## 2022-10-23 ENCOUNTER — Ambulatory Visit
Admission: RE | Admit: 2022-10-23 | Discharge: 2022-10-23 | Disposition: A | Discharge status: Home / Self Care | Payer: PPO | Source: Ambulatory Visit | Attending: Nurse Practitioner | Admitting: Nurse Practitioner

## 2022-10-23 DIAGNOSIS — R053 Chronic cough: Secondary | ICD-10-CM | POA: Diagnosis not present

## 2022-10-23 DIAGNOSIS — I700 Atherosclerosis of aorta: Secondary | ICD-10-CM | POA: Diagnosis not present

## 2022-10-30 ENCOUNTER — Telehealth: Payer: Self-pay

## 2022-10-30 MED ORDER — MECLIZINE HCL 25 MG PO TABS: 25.0000 mg | ORAL_TABLET | Freq: Three times a day (TID) | ORAL | 0 refills | Status: DC | PRN

## 2022-10-30 NOTE — Telephone Encounter (Signed)
Lmom that we send meclizine for dizziness

## 2022-11-02 NOTE — Telephone Encounter (Signed)
Pt notified for CT scan

## 2022-11-11 ENCOUNTER — Telehealth: Payer: Self-pay | Admitting: Cardiovascular Disease

## 2022-11-11 NOTE — Telephone Encounter (Signed)
Patient had a CT Scan done recently. Patient would like Dr. Kirke Corin or PA Eula Listen to look over the test results. Patient was having the test done to check her lungs, but she was told that there was something on there about the arteries of her heart. Patient is requesting that they check the results to see if there is anything she needs to be concerned about. Please advise.

## 2022-11-11 NOTE — Telephone Encounter (Signed)
Please inform the patient the CT scan showed hardening of the coronary arteries, which are the blood vessels that supply blood to the heart.  This does not equal blockage in and of itself, though does increase the risk of blockage.  She has a history of statin intolerance secondary to abnormal LFT and has been managed on ezetimibe and fenofibrate by her PCP.  I would encourage her to consider PCSK9 inhibitor for further risk stratification along with continuation of aspirin 81 mg daily.  We should have her come back into the office to discuss further risk stratification options.

## 2022-11-11 NOTE — Telephone Encounter (Signed)
The patient was calling to let Dr. Kirke Corin and Alycia Rossetti know that she had a CT Chest in April and was advised to contact cardiology concerning those results. Results are in epic.   FINDINGS: Cardiovascular: Extensive multi-vessel coronary artery calcification. Global cardiac size within normal limits. No pericardial effusion. Central pulmonary arteries are of normal caliber. Mild atherosclerotic calcification within the thoracic aorta. No aortic aneurysm.

## 2022-11-12 NOTE — Telephone Encounter (Signed)
Left voicemail message to call back  

## 2022-11-13 NOTE — Telephone Encounter (Signed)
Reviewed provider input on her CT scan. Scheduled follow up appointment for her to discuss further options with provider. She verbalized understanding with no further questions at this time.

## 2022-11-19 ENCOUNTER — Other Ambulatory Visit: Payer: Self-pay | Admitting: Physician Assistant

## 2022-11-27 ENCOUNTER — Encounter: Payer: Self-pay | Admitting: Physician Assistant

## 2022-11-27 ENCOUNTER — Ambulatory Visit: Payer: PPO | Attending: Physician Assistant | Admitting: Physician Assistant

## 2022-11-27 ENCOUNTER — Other Ambulatory Visit: Payer: Self-pay | Admitting: Cardiovascular Disease

## 2022-11-27 VITALS — BP 138/72 | HR 66 | Ht 66.5 in | Wt 163.6 lb

## 2022-11-27 DIAGNOSIS — R053 Chronic cough: Secondary | ICD-10-CM | POA: Diagnosis not present

## 2022-11-27 DIAGNOSIS — E785 Hyperlipidemia, unspecified: Secondary | ICD-10-CM | POA: Diagnosis not present

## 2022-11-27 DIAGNOSIS — R0609 Other forms of dyspnea: Secondary | ICD-10-CM | POA: Diagnosis not present

## 2022-11-27 DIAGNOSIS — I2584 Coronary atherosclerosis due to calcified coronary lesion: Secondary | ICD-10-CM | POA: Diagnosis not present

## 2022-11-27 DIAGNOSIS — I10 Essential (primary) hypertension: Secondary | ICD-10-CM | POA: Diagnosis not present

## 2022-11-27 DIAGNOSIS — I700 Atherosclerosis of aorta: Secondary | ICD-10-CM | POA: Diagnosis not present

## 2022-11-27 DIAGNOSIS — I2510 Atherosclerotic heart disease of native coronary artery without angina pectoris: Secondary | ICD-10-CM | POA: Diagnosis not present

## 2022-11-27 MED ORDER — NEXLETOL 180 MG PO TABS: 180.0000 mg | ORAL_TABLET | Freq: Every day | ORAL | 11 refills | Status: DC

## 2022-11-27 MED ORDER — NEXLIZET 180-10 MG PO TABS: 1.0000 | ORAL_TABLET | Freq: Every day | ORAL | 11 refills | Status: DC

## 2022-11-27 NOTE — Progress Notes (Signed)
Cardiology Office Note    Date:  11/27/2022   ID:  Toni, [name] 03/01/1946, MRN [id number]  PCP:  Sallyanne Kuster, NP  Cardiologist:  Lorine Bears, MD  Electrophysiologist:  None   Chief Complaint: Follow-up  History of Present Illness:   Toni [name] is a 77 y.o. female with history of coronary artery calcification, aortic atherosclerosis, DM2, HTN, HLD, hypothyroidism, obesity, and mild sleep apnea that does not require CPAP usage who presents for follow-up of coronary artery calcification.   She was previously evaluated by Dr. Kirke Corin in 2016 for atypical chest pain.  CT of the abdomen showed aortic atherosclerosis.  Carotid Doppler showed mild intimal thickening with calcified plaque bilaterally.  ABI was normal bilaterally.  Echo showed normal LV systolic function with no significant valvular abnormalities.  Lexiscan MPI showed no evidence of ischemia with a normal EF and was overall low risk.  She was referred back to Dr. Kirke Corin in 09/2021 for evaluation of hypertension with intolerance to multiple medications including ACE inhibitor and ARB, due to dry cough.  It was noted she had previously been on amlodipine and diltiazem, though these did not adequately control her blood pressure.  She had previously been on beta-blocker for migraines, though this led to bradycardia.  She was subsequently placed on nifedipine, though noted increased lower extremity swelling involving the left leg with this.  She had been on HCTZ for quite a while and was tolerating this medication.  She reported her blood pressure became uncontrolled over the preceding year.  There was some mild exertional dyspnea and fatigue.  She was without chest pain.  Blood pressure in the office was elevated at 152/80.  Elevated BP was felt to likely be essential in etiology.  Given lower extremity swelling, nifedipine was stopped, HCTZ was transitioned to chlorthalidone, and carvedilol was added.  Given fatigue and  dyspnea, as well as murmur suggestive of aortic sclerosis on exam, she underwent echo on 09/30/2021 which demonstrated an EF of 60 to 65%, no regional wall motion abnormalities, grade 1 diastolic dysfunction, normal RV systolic function and ventricular cavity size, mild mitral regurgitation, and an estimated right atrial pressure of 3 mmHg.  She was seen in follow up on 10/07/2021 and was doing well.  Since initiating carvedilol and transitioning HCTZ to chlorthalidone, she continued to note elevated BP readings typically in the 150s to 170s systolic.  However, since undergoing these medication changes she had only had 1 systolic blood pressure reading greater than 200 mmHg.  She did note some increase in fatigue following the initiation of carvedilol.  Following the discontinuation of nifedipine she noted resolution of lower extremity swelling.  Coreg was titrated to 12.5 mg bid and she was continued on chlorthalidone 25 mg.  Renal artery ultrasound showed no evidence of RAS bilaterally.  Rennin aldosterone and plasma metanephrines were normal.     She was seen in the office in 11/2021 and noted improvement in BP readings at home with most readings in the 140s systolic.  She was adjusting to some fatigue associated with carvedilol.  She was seen in the office on 05/14/2022 and continued to note fatigue following the initiation and subsequent titration of carvedilol.  Blood pressure readings at home were in the 160s to 180s systolic.  She preferred to discontinue carvedilol.  Despite discontinuation of ACE inhibitor/ARB she continued to note a cough.  Blood pressure in the office was 140/80.  Carvedilol was discontinued and she was initiated on spironolactone  25 mg daily.   She was seen in the office on 06/08/2022 and remained without symptoms of angina or cardiac decompensation.  She noted an improvement in her fatigue following discontinuation of carvedilol and felt like her energy was back to baseline.  Cough  persisted despite discontinuation of ACE inhibitor and ARB and has been present since her COVID illness.  She also reported a recent infection with RSV which she felt like was contributing to her cough.  Blood pressure in the office was 166/82, spironolactone was titrated to 25 mg twice daily.    She was seen in the office in 08/2022 and remained without symptoms of angina or cardiac decompensation.  Blood pressure at home is typically in the 140s over 80s.  We underwent a rechallenge of losartan 25 mg daily with continuation of spironolactone 25 mg twice daily and chlorthalidone 25 mg daily.  Follow-up labs showed stable renal function and potassium.  She was last seen in the office in 10/2022 and remained without symptoms of angina or cardiac decompensation.  She was tolerating antihypertensive therapy without adverse effect.  Losartan was titrated to 25 mg twice daily with continuation of chlorthalidone 25 mg daily and spironolactone 25 mg twice daily.  She subsequently underwent CT of the chest for chronic cough which demonstrated multivessel coronary artery calcification, aortic atherosclerosis, and mild bronchial wall thickening.  Given coronary artery calcification noted on imaging, it was recommended she come in today to discuss further lipid lowering strategies.  She comes in doing well from a cardiac perspective and is without symptoms of angina or cardiac decompensation.  She does note a slight increase in exertional dyspnea with activities such as mowing the lawn.  No dizziness, presyncope, or syncope.  Following titration of losartan she did note some positional dizziness leading her to decrease this back to 25 mg daily with continuation of chlorthalidone and spironolactone.  Taking beet supplement.  Chronic cough has persisted despite medication holiday from ACE inhibitor/ARB and with treatment for GERD.  She requests referral to pulmonology with Dr. Vassie Loll.  No lower extremity swelling or  progressive orthopnea.   Labs independently reviewed: 10/2022 - A1c 6.8 08/2022 - potassium 3.9, BUN 18, serum creatinine 0.94 06/2022 - TSH 0.110, free T4 normal, TC 242, TG 168, HDL 48, LDL 163 03/2021 - albumin 4.4, AST/ALT normal  Past Medical History:  Diagnosis Date   Allergic rhinitis    Allergy    Aortic atherosclerosis (HCC)    Diabetes mellitus, type 2 (HCC)    no medicines- was pre diabtes- diet controlled    Diverticulitis    Essential hypertension    GERD (gastroesophageal reflux disease)    Hyperlipidemia    Hypertension    Hypothyroidism    Obesity    Osteoporosis    Sleep apnea    mild -no cpap   Transient insomnia    Tubular adenoma of colon 05/2013    Past Surgical History:  Procedure Laterality Date   APPENDECTOMY     BUNIONECTOMY     right foot   CARDIAC CATHETERIZATION  1994   [facility]    CATARACT EXTRACTION, BILATERAL Left 10/20/2017   CATARACT EXTRACTION, BILATERAL Right 10/27/2017   CHOLECYSTECTOMY     COLONOSCOPY  05/25/2016   Dr.Stark   COLONOSCOPY     March 2023   HAMMER TOE SURGERY  12/2012   left toe   PARTIAL HYSTERECTOMY     POLYPECTOMY     ROBOTIC ASSISTED SALPINGO OOPHERECTOMY  right ovary first removed then left ovary removed years later   TONSILLECTOMY  age 48   TOTAL THYROIDECTOMY  09/2011    Current Medications: Current Meds  Medication Sig   aspirin 81 MG tablet Take 81 mg by mouth daily.   Bempedoic Acid-Ezetimibe (NEXLIZET) 180-10 MG TABS Take 1 tablet by mouth daily.   Calcium Carbonate-Vitamin D (CALCIUM + D PO) Take 1 tablet by mouth daily.    cetirizine (ZYRTEC) 10 MG tablet Take 10 mg by mouth as needed.    chlorthalidone (HYGROTON) 25 MG tablet TAKE ONE TABLET BY MOUTH ONCE A DAY   cholecalciferol (VITAMIN D) 1000 units tablet Take 1,000 Units by mouth daily.   ezetimibe (ZETIA) 10 MG tablet TAKE ONE TABLET BY MOUTH EVERY DAY   fenofibrate 54 MG tablet TAKE ONE TABLET BY MOUTH EVERY DAY FOR ELEVATED  CHOLESTEROL   losartan (COZAAR) 25 MG tablet Take 1 tablet (25 mg total) by mouth 2 (two) times daily.   Omega-3 Fatty Acids (OMEGA 3 PO) Take 1 capsule by mouth daily.   pantoprazole (PROTONIX) 40 MG tablet TAKE ONE TABLET BY MOUTH ONCE A DAY   spironolactone (ALDACTONE) 25 MG tablet Take 1 tablet (25 mg) by mouth twice daily   SYNTHROID 112 MCG tablet TAKE ONE TABLET BY MOUTH EVERY MORNING   [DISCONTINUED] Bempedoic Acid (NEXLETOL) 180 MG TABS Take 1 tablet (180 mg total) by mouth daily.    Allergies:   Cefaclor, Penicillins, and Talwin [pentazocine]   Social History   Socioeconomic History   Marital status: Married    Spouse name: Not on file   Number of children: 2   Years of education: Not on file   Highest education level: Not on file  Occupational History   Occupation: Retired  Tobacco Use   Smoking status: Former    Packs/day: 1.00    Years: 30.00    Additional pack years: 0.00    Total pack years: 30.00    Types: Cigarettes    Quit date: 07/06/1998    Years since quitting: 24.4   Smokeless tobacco: Never  Vaping Use   Vaping Use: Never used  Substance and Sexual Activity   Alcohol use: No    Alcohol/week: 0.0 standard drinks of alcohol   Drug use: No   Sexual activity: Not on file  Other Topics Concern   Not on file  Social History Narrative   Not on file   Social Determinants of Health   Financial Resource Strain: Low Risk  (12/24/2020)   Overall Financial Resource Strain (CARDIA)    Difficulty of Paying Living Expenses: Not hard at all  Food Insecurity: Not on file  Transportation Needs: Not on file  Physical Activity: Not on file  Stress: Not on file  Social Connections: Not on file     Family History:  The patient's family history includes Breast cancer in her cousin; Colon cancer in her maternal aunt and maternal uncle; Colon polyps in her maternal aunt and maternal uncle; Esophageal cancer in her father; Heart disease in her maternal uncle; Prostate  cancer in her maternal uncle; Stomach cancer in her maternal uncle; Throat cancer in her father. There is no history of Diabetes, Kidney disease, Gallbladder disease, or Rectal cancer.  ROS:   12-point review of systems is negative unless otherwise noted in the HPI.   EKGs/Labs/Other Studies Reviewed:    Studies reviewed were summarized above. The additional studies were reviewed today:  Renal artery ultrasound 10/10/2021: Renal:  Right: Normal size right kidney. Normal right Resisitive Index.         Normal cortical thickness of right kidney. RRV flow present.         No evidence of right renal artery stenosis.  Left:  LRV flow present. No evidence of left renal artery stenosis.         Normal size of left kidney. Normal left Resistive Index.         Normal cortical thickness of the left kidney.  Mesenteric:  Normal Celiac artery and Superior Mesenteric artery findings.  __________   2D echo 09/30/2021: 1. Left ventricular ejection fraction, by estimation, is 60 to 65%. The  left ventricle has normal function. The left ventricle has no regional  wall motion abnormalities. Left ventricular diastolic parameters are  consistent with Grade I diastolic  dysfunction (impaired relaxation).   2. Right ventricular systolic function is normal. The right ventricular  size is normal.   3. The mitral valve is normal in structure. Mild mitral valve  regurgitation. No evidence of mitral stenosis.   4. The aortic valve is tricuspid. Aortic valve regurgitation is not  visualized. No aortic stenosis is present.   5. The inferior vena cava is normal in size with greater than 50%  respiratory variability, suggesting right atrial pressure of 3 mmHg. __________   Eugenie Birks MPI 12/19/2014: Pharmacological myocardial perfusison imaging study with no significnat ischemia. The left ventricular ejection fraction is hyperdynamic (>65%). No wall motion abnormality noted. No EKG changes concerning for  ischemia. This is a low risk study.   EKG:  EKG is not ordered today.   Recent Labs: 06/22/2022: TSH 0.110 08/18/2022: BUN 18; Creatinine, Ser 0.94; Potassium 3.9; Sodium 137  Recent Lipid Panel    Component Value Date/Time   CHOL 242 (H) 06/22/2022 0842   TRIG 168 (H) 06/22/2022 0842   HDL 48 06/22/2022 0842   CHOLHDL 5.0 (H) 06/22/2022 0842   LDLCALC 163 (H) 06/22/2022 0842    PHYSICAL EXAM:    VS:  BP 138/72 (BP Location: Left Arm, Patient Position: Sitting, Cuff Size: Normal)   Pulse 66   Ht 5' 6.5" (1.689 m)   Wt 163 lb 9.6 oz (74.2 kg)   SpO2 98%   BMI 26.01 kg/m   BMI: Body mass index is 26.01 kg/m.  Physical Exam Constitutional:      Appearance: She is well-developed.  HENT:     Head: Normocephalic and atraumatic.  Eyes:     General:        Right eye: No discharge.        Left eye: No discharge.  Neck:     Vascular: No JVD.  Cardiovascular:     Rate and Rhythm: Normal rate and regular rhythm.     Heart sounds: S1 normal and S2 normal. Heart sounds not distant. No midsystolic click and no opening snap. Murmur heard.     Systolic murmur is present with a grade of 1/6 at the upper left sternal border.     No friction rub.  Pulmonary:     Effort: Pulmonary effort is normal. No respiratory distress.     Breath sounds: Normal breath sounds. No decreased breath sounds, wheezing or rales.  Chest:     Chest wall: No tenderness.  Abdominal:     General: There is no distension.  Musculoskeletal:     Cervical back: Normal range of motion.     Right lower leg: No edema.  Left lower leg: No edema.  Skin:    General: Skin is warm and dry.     Nails: There is no clubbing.  Neurological:     Mental Status: She is alert and oriented to person, place, and time.  Psychiatric:        Speech: Speech normal.        Behavior: Behavior normal.        Thought Content: Thought content normal.        Judgment: Judgment normal.     Wt Readings from Last 3  Encounters:  11/27/22 163 lb 9.6 oz (74.2 kg)  10/13/22 162 lb 9.6 oz (73.8 kg)  10/05/22 162 lb 12.8 oz (73.8 kg)     ASSESSMENT & PLAN:   Coronary artery calcification with exertional dyspnea/aortic atherosclerosis/HLD and hypertriglyceridemia with statin intolerance: No symptoms of chest pain, though as noted a slight increase of exertional dyspnea when mowing the lawn.  Recent chest CT, to evaluate chronic cough, demonstrated multivessel coronary artery calcification and aortic atherosclerosis.  Schedule Lexiscan MPI to evaluate for high risk ischemia.  Continue aspirin.  LDL 163.  Intolerant to statins secondary to abnormal liver function testing.  Has historically been managed with ezetimibe and fenofibrate.  With noted coronary artery calcification we will escalate medical therapy with addition of Nexlizet with subsequent discontinuation ezetimibe once Nexlizet is started with continuation of fenofibrate.  Unable to prescribe PCSK9 inhibitor secondary to previously noted financial constraints.  HTN: Blood pressure is reasonably controlled in the office today.  She self reduced losartan back to once daily due to positional dizziness.  Continue chlorthalidone 25 mg, losartan 25 mg, spironolactone 25 mg twice daily.  Chronic cough: Longstanding issue and has persisted despite medication holidays of ACE inhibitor/ARB, as well as with treatment with PPI for GERD.  She requests referral to Dr. Vassie Loll.  Referral.   Shared Decision Making/Informed Consent{  The risks [chest pain, shortness of breath, cardiac arrhythmias, dizziness, blood pressure fluctuations, myocardial infarction, stroke/transient ischemic attack, nausea, vomiting, allergic reaction, radiation exposure, metallic taste sensation and life-threatening complications (estimated to be 1 in 10,000)], benefits (risk stratification, diagnosing coronary artery disease, treatment guidance) and alternatives of a nuclear stress test were  discussed in detail with Ms. Enciso and she agrees to proceed.     Disposition: F/u with Dr. Kirke Corin or an APP in 4 months.   Medication Adjustments/Labs and Tests Ordered: Current medicines are reviewed at length with the patient today.  Concerns regarding medicines are outlined above. Medication changes, Labs and Tests ordered today are summarized above and listed in the Patient Instructions accessible in Encounters.   Signed, Eula Listen, PA-C 11/27/2022 10:52 AM     [facility] [place] [address] [place], [place] [address] [phone]

## 2022-11-27 NOTE — Patient Instructions (Addendum)
Medication Instructions:  Your physician has recommended you make the following change in your medication:   START Nexlizet 180-10 mg once daily  ONCE you start Nexletol then you can stop the ezetimibe (zetia)     *If you need a refill on your cardiac medications before your next appointment, please call your pharmacy*   Lab Work: None  If you have labs (blood work) drawn today and your tests are completely normal, you will receive your results only by: MyChart Message (if you have MyChart) OR A paper copy in the mail If you have any lab test that is abnormal or we need to change your treatment, we will call you to review the results.   Testing/Procedures: [facility] MYOVIEW  Your Provider has ordered a Stress Test with nuclear imaging. The purpose of this test is to evaluate the blood supply to your heart muscle. This procedure is referred to as a "Non-Invasive Stress Test." This is because other than having an IV started in your vein, nothing is inserted or "invades" your body. Cardiac stress tests are done to find areas of poor blood flow to the heart by determining the extent of coronary artery disease (CAD). Some patients exercise on a treadmill, which naturally increases the blood flow to your heart, while others who are unable to walk on a treadmill due to physical limitations have a pharmacologic/chemical stress agent called Lexiscan. This medicine will mimic walking on a treadmill by temporarily increasing your coronary blood flow.     REPORT TO [facility] MEDICAL MALL ENTRANCE  **Proceed to the 1st desk on the right, REGISTRATION, to check in**  Please note: this test may take anywhere between 2-4 hours to complete    Instructions regarding medication:   _XX__:   You may take all of your regular morning medications the day of your test unless listed below.   _XX___:  Hold other medications as follows: Spironolactone & Chlorthalidone the morning of your procedure.    How to  prepare for your Myoview test:  Do not eat or drink for 6 hours prior to the test No caffeine for 24 hours prior to the test No smoking 24 hours prior to the test. Ladies, please do not wear dresses.  Skirts or pants are appropriate. Please wear a short sleeve shirt. No perfume, cologne or lotion. Wear comfortable walking shoes. No heels!   PLEASE NOTIFY THE OFFICE AT LEAST 24 HOURS IN ADVANCE IF YOU ARE UNABLE TO KEEP YOUR APPOINTMENT.  [phone] AND  PLEASE NOTIFY NUCLEAR MEDICINE AT [facility] AT LEAST 24 HOURS IN ADVANCE IF YOU ARE UNABLE TO KEEP YOUR APPOINTMENT. [phone]    Follow-Up: At [facility], you and your health needs are our priority.  As part of our continuing mission to provide you with exceptional heart care, we have created designated Provider Care Teams.  These Care Teams include your primary Cardiologist (physician) and Advanced Practice Providers (APPs -  Physician Assistants and Nurse Practitioners) who all work together to provide you with the care you need, when you need it.  We recommend signing up for the patient portal called "MyChart".  Sign up information is provided on this After Visit Summary.  MyChart is used to connect with patients for Virtual Visits (Telemedicine).  Patients are able to view lab/test results, encounter notes, upcoming appointments, etc.  Non-urgent messages can be sent to your provider as well.   To learn more about what you can do with MyChart, go to [email or web address].  Your next appointment:   4 month(s)  Provider:   Lorine Bears, MD or Eula Listen, PA-C    Other Instructions Referral placed to see Dr. Vassie Loll with pulmonary

## 2022-11-30 ENCOUNTER — Other Ambulatory Visit (HOSPITAL_COMMUNITY): Payer: Self-pay

## 2022-12-04 ENCOUNTER — Ambulatory Visit
Admission: RE | Admit: 2022-12-04 | Discharge: 2022-12-04 | Disposition: A | Discharge status: Home / Self Care | Payer: PPO | Source: Ambulatory Visit | Attending: Physician Assistant | Admitting: Physician Assistant

## 2022-12-04 DIAGNOSIS — R0609 Other forms of dyspnea: Secondary | ICD-10-CM | POA: Diagnosis not present

## 2022-12-04 LAB — NM MYOCAR MULTI W/SPECT W/WALL MOTION / EF
LV dias vol: 28 mL (ref 46–106)
LV sys vol: 8 mL
Nuc Stress EF: 71 %
Peak HR: 101 {beats}/min
Rest HR: 65 {beats}/min
Rest Nuclear Isotope Dose: 9.1 mCi
SDS: 1
SRS: 2
SSS: 1
ST Depression (mm): 0 mm
Stress Nuclear Isotope Dose: 30.8 mCi
TID: 1

## 2022-12-04 MED ORDER — REGADENOSON 0.4 MG/5ML IV SOLN
0.4000 mg | Freq: Once | INTRAVENOUS | Status: AC
  Administered 2022-12-04: 0.4 mg via INTRAVENOUS

## 2022-12-04 MED ORDER — TECHNETIUM TC 99M TETROFOSMIN IV KIT
10.0000 | PACK | Freq: Once | INTRAVENOUS | Status: AC | PRN
  Administered 2022-12-04: 9.01 via INTRAVENOUS

## 2022-12-04 MED ORDER — TECHNETIUM TC 99M TETROFOSMIN IV KIT
30.8200 | PACK | Freq: Once | INTRAVENOUS | Status: AC | PRN
  Administered 2022-12-04: 30.82 via INTRAVENOUS

## 2022-12-14 ENCOUNTER — Telehealth: Payer: Self-pay | Admitting: Physician Assistant

## 2022-12-14 NOTE — Telephone Encounter (Signed)
Pt c/o medication issue:  1. Name of Medication: Bempedoic Acid-Ezetimibe (NEXLIZET) 180-10 MG TABS   2. How are you currently taking this medication (dosage and times per day)? Take 1 tablet by mouth daily.   3. Are you having a reaction (difficulty breathing--STAT)? no  4. What is your medication issue? Patient is calling to find out if her insurance is going to cover this medication.  She was stating something about [facility] being a speciality pharmacy and not being sure is [facility] is [employer].  She wants to know what her next step is.

## 2022-12-17 NOTE — Telephone Encounter (Signed)
Spoke with patient and reviewed that I will call pharmacy and then send to our prior auth team to see if they have any information on the medication. Advised that I will give her a call back once I find out what next steps are needed.

## 2022-12-17 NOTE — Telephone Encounter (Signed)
Left detailed voicemail message with information on process and to call back if she has not heard anything in one week with instructions to call back if any questions.

## 2022-12-17 NOTE — Telephone Encounter (Signed)
Called and spoke with pharmacy and they are just waiting on prior authorization for that medication. She states that it was sent over to [place] for prior auth to be done and they have not heard back from them yet. Will reach out to our prior auth team to follow up on this with them. Will also call patient to update her on current status.

## 2022-12-18 ENCOUNTER — Telehealth: Payer: Self-pay

## 2022-12-18 ENCOUNTER — Other Ambulatory Visit (HOSPITAL_COMMUNITY): Payer: Self-pay

## 2022-12-18 NOTE — Telephone Encounter (Addendum)
Pharmacy Patient Advocate Encounter   Received notification from RN that prior authorization for NEXLIZET is required/requested.   PA submitted to [employer]/ [employer] via CoverMyMeds(documentation had to be faxed)  Key or (Medicaid) confirmation # [id number]  Status is pending

## 2022-12-21 ENCOUNTER — Other Ambulatory Visit (HOSPITAL_COMMUNITY): Payer: Self-pay

## 2022-12-21 NOTE — Telephone Encounter (Signed)
Pharmacy Patient Advocate Encounter  Prior Authorization for NEXLIZET  has been APPROVED by [employer]/ [employer] from 6.14.24 to 12.14.24.

## 2022-12-21 NOTE — Telephone Encounter (Signed)
Left detailed message that the Nexlizet has been approved from 12/18/22 to 06/19/23 and instructed to call back if any further questions.

## 2022-12-23 ENCOUNTER — Telehealth: Payer: Self-pay | Admitting: Nurse Practitioner

## 2022-12-23 NOTE — Telephone Encounter (Signed)
Patient sees her own eye doc for diabetic eye exams-Toni

## 2023-01-27 DIAGNOSIS — Z1231 Encounter for screening mammogram for malignant neoplasm of breast: Secondary | ICD-10-CM | POA: Diagnosis not present

## 2023-02-01 DIAGNOSIS — I788 Other diseases of capillaries: Secondary | ICD-10-CM | POA: Diagnosis not present

## 2023-02-01 DIAGNOSIS — D2271 Melanocytic nevi of right lower limb, including hip: Secondary | ICD-10-CM | POA: Diagnosis not present

## 2023-02-01 DIAGNOSIS — D485 Neoplasm of uncertain behavior of skin: Secondary | ICD-10-CM | POA: Diagnosis not present

## 2023-02-01 DIAGNOSIS — C44321 Squamous cell carcinoma of skin of nose: Secondary | ICD-10-CM | POA: Diagnosis not present

## 2023-02-01 DIAGNOSIS — D2261 Melanocytic nevi of right upper limb, including shoulder: Secondary | ICD-10-CM | POA: Diagnosis not present

## 2023-02-01 DIAGNOSIS — L821 Other seborrheic keratosis: Secondary | ICD-10-CM | POA: Diagnosis not present

## 2023-02-01 DIAGNOSIS — D225 Melanocytic nevi of trunk: Secondary | ICD-10-CM | POA: Diagnosis not present

## 2023-02-01 DIAGNOSIS — D2272 Melanocytic nevi of left lower limb, including hip: Secondary | ICD-10-CM | POA: Diagnosis not present

## 2023-02-01 DIAGNOSIS — D2262 Melanocytic nevi of left upper limb, including shoulder: Secondary | ICD-10-CM | POA: Diagnosis not present

## 2023-02-04 ENCOUNTER — Encounter: Payer: Self-pay | Admitting: Nurse Practitioner

## 2023-02-04 ENCOUNTER — Ambulatory Visit (INDEPENDENT_AMBULATORY_CARE_PROVIDER_SITE_OTHER): Payer: PPO | Admitting: Nurse Practitioner

## 2023-02-04 VITALS — BP 138/70 | HR 79 | Temp 98.3°F | Resp 16 | Ht 66.5 in | Wt 156.6 lb

## 2023-02-04 DIAGNOSIS — E119 Type 2 diabetes mellitus without complications: Secondary | ICD-10-CM | POA: Diagnosis not present

## 2023-02-04 DIAGNOSIS — E782 Mixed hyperlipidemia: Secondary | ICD-10-CM

## 2023-02-04 DIAGNOSIS — I10 Essential (primary) hypertension: Secondary | ICD-10-CM | POA: Diagnosis not present

## 2023-02-04 DIAGNOSIS — I700 Atherosclerosis of aorta: Secondary | ICD-10-CM | POA: Diagnosis not present

## 2023-02-04 LAB — POCT GLYCOSYLATED HEMOGLOBIN (HGB A1C): Hemoglobin A1C: 6.4 % — AB (ref 4.0–5.6)

## 2023-02-04 MED ORDER — LOSARTAN POTASSIUM 25 MG PO TABS
25.0000 mg | ORAL_TABLET | Freq: Every day | ORAL | Status: DC
Start: 2023-02-04 — End: 2023-11-01

## 2023-02-04 NOTE — Progress Notes (Signed)
[facility] [address] [place], [place] [address]  Internal MEDICINE  Office Visit Note  Patient Name: Toni [name]  [id number]  [id number]  Date of Service: 02/04/2023  Chief Complaint  Patient presents with   Diabetes   Gastroesophageal Reflux   Hypertension   Hyperlipidemia   Follow-up    HPI Donae presents for a follow-up visit for diabetes, hypertension, and high cholesterol. Diabetes -- remains diet controlled. A1c improved to 6.4 today from 6.8 in April.  Hypertension -- taking losartan and spironolactone. BP controlled with current regimen.  Aortic atherosclerosis ---taking fenofibrate and was started on nexlizet by cardiology.     Current Medication: Outpatient Encounter Medications as of 02/04/2023  Medication Sig   aspirin 81 MG tablet Take 81 mg by mouth daily.   Bempedoic Acid-Ezetimibe (NEXLIZET) 180-10 MG TABS Take 1 tablet by mouth daily.   Calcium Carbonate-Vitamin D (CALCIUM + D PO) Take 1 tablet by mouth daily.    cetirizine (ZYRTEC) 10 MG tablet Take 10 mg by mouth as needed.    chlorthalidone (HYGROTON) 25 MG tablet TAKE ONE TABLET BY MOUTH ONCE A DAY   cholecalciferol (VITAMIN D) 1000 units tablet Take 1,000 Units by mouth daily.   fenofibrate 54 MG tablet TAKE ONE TABLET BY MOUTH EVERY DAY FOR ELEVATED CHOLESTEROL   meclizine (ANTIVERT) 25 MG tablet Take 1 tablet (25 mg total) by mouth 3 (three) times daily as needed for dizziness.   Omega-3 Fatty Acids (OMEGA 3 PO) Take 1 capsule by mouth daily.   pantoprazole (PROTONIX) 40 MG tablet TAKE ONE TABLET BY MOUTH ONCE A DAY   spironolactone (ALDACTONE) 25 MG tablet Take 1 tablet (25 mg) by mouth twice daily   SYNTHROID 112 MCG tablet TAKE ONE TABLET BY MOUTH EVERY MORNING   [DISCONTINUED] doxycycline (VIBRA-TABS) 100 MG tablet Take 1 tablet (100 mg total) by mouth 2 (two) times daily. Take with food.   losartan (COZAAR) 25 MG tablet Take 1 tablet (25 mg total) by mouth daily.    [DISCONTINUED] ezetimibe (ZETIA) 10 MG tablet TAKE ONE TABLET BY MOUTH EVERY DAY (Patient not taking: Reported on 02/04/2023)   [DISCONTINUED] losartan (COZAAR) 25 MG tablet Take 1 tablet (25 mg total) by mouth 2 (two) times daily.   No facility-administered encounter medications on file as of 02/04/2023.    Surgical History: Past Surgical History:  Procedure Laterality Date   APPENDECTOMY     BUNIONECTOMY     right foot   CARDIAC CATHETERIZATION  1994   [facility]    CATARACT EXTRACTION, BILATERAL Left 10/20/2017   CATARACT EXTRACTION, BILATERAL Right 10/27/2017   CHOLECYSTECTOMY     COLONOSCOPY  05/25/2016   Dr.Stark   COLONOSCOPY     March 2023   HAMMER TOE SURGERY  12/2012   left toe   PARTIAL HYSTERECTOMY     POLYPECTOMY     ROBOTIC ASSISTED SALPINGO OOPHERECTOMY     right ovary first removed then left ovary removed years later   TONSILLECTOMY  age 27   TOTAL THYROIDECTOMY  09/2011    Medical History: Past Medical History:  Diagnosis Date   Allergic rhinitis    Allergy    Aortic atherosclerosis (HCC)    Diabetes mellitus, type 2 (HCC)    no medicines- was pre diabtes- diet controlled    Diverticulitis    Essential hypertension    GERD (gastroesophageal reflux disease)    Hyperlipidemia    Hypertension    Hypothyroidism  Obesity    Osteoporosis    Sleep apnea    mild -no cpap   Transient insomnia    Tubular adenoma of colon 05/2013    Family History: Family History  Problem Relation Age of Onset   Throat cancer Father    Esophageal cancer Father    Colon cancer Maternal Aunt    Colon polyps Maternal Aunt    Colon cancer Maternal Uncle    Colon polyps Maternal Uncle    Heart disease Maternal Uncle    Stomach cancer Maternal Uncle    Prostate cancer Maternal Uncle    Breast cancer Cousin    Diabetes Neg Hx    Kidney disease Neg Hx    Gallbladder disease Neg Hx    Rectal cancer Neg Hx     Social History   Socioeconomic History   Marital  status: Married    Spouse name: Not on file   Number of children: 2   Years of education: Not on file   Highest education level: Not on file  Occupational History   Occupation: Retired  Tobacco Use   Smoking status: Former    Current packs/day: 0.00    Average packs/day: 1 pack/day for 30.0 years (30.0 ttl pk-yrs)    Types: Cigarettes    Start date: 07/06/1968    Quit date: 07/06/1998    Years since quitting: 24.6   Smokeless tobacco: Never  Vaping Use   Vaping status: Never Used  Substance and Sexual Activity   Alcohol use: No    Alcohol/week: 0.0 standard drinks of alcohol   Drug use: No   Sexual activity: Not on file  Other Topics Concern   Not on file  Social History Narrative   Not on file   Social Determinants of Health   Financial Resource Strain: Low Risk  (12/24/2020)   Overall Financial Resource Strain (CARDIA)    Difficulty of Paying Living Expenses: Not hard at all  Food Insecurity: Not on file  Transportation Needs: Not on file  Physical Activity: Not on file  Stress: Not on file  Social Connections: Not on file  Intimate Partner Violence: Not on file      Review of Systems  Constitutional:  Negative for activity change, appetite change, chills, fatigue, fever and unexpected weight change.  HENT: Negative.  Negative for congestion, ear pain, rhinorrhea, sore throat and trouble swallowing.   Eyes: Negative.   Respiratory:  Positive for cough (chronic). Negative for chest tightness, shortness of breath and wheezing.   Cardiovascular: Negative.  Negative for chest pain.  Gastrointestinal: Negative.  Negative for abdominal pain, blood in stool, constipation, diarrhea, nausea and vomiting.  Endocrine: Negative.   Genitourinary: Negative.  Negative for difficulty urinating, dysuria, frequency, hematuria and urgency.  Musculoskeletal:  Positive for arthralgias. Negative for back pain, joint swelling, myalgias and neck pain.  Skin: Negative.  Negative for rash and  wound.  Allergic/Immunologic: Negative.  Negative for immunocompromised state.  Neurological: Negative.  Negative for dizziness, seizures, numbness and headaches.  Hematological: Negative.   Psychiatric/Behavioral: Negative.  Negative for behavioral problems, self-injury and suicidal ideas. The patient is not nervous/anxious.     Vital Signs: BP 138/70   Pulse 79   Temp 98.3 F (36.8 C)   Resp 16   Ht 5' 6.5" (1.689 m)   Wt 156 lb 9.6 oz (71 kg)   SpO2 96%   BMI 24.90 kg/m    Physical Exam Vitals reviewed.  Constitutional:  General: She is not in acute distress.    Appearance: Normal appearance. She is not ill-appearing.  HENT:     Head: Normocephalic and atraumatic.  Eyes:     Pupils: Pupils are equal, round, and reactive to light.  Cardiovascular:     Rate and Rhythm: Normal rate and regular rhythm.  Pulmonary:     Effort: Pulmonary effort is normal. No respiratory distress.  Neurological:     Mental Status: She is alert and oriented to person, place, and time.  Psychiatric:        Mood and Affect: Mood normal.        Behavior: Behavior normal.        Assessment/Plan: 1. Diet-controlled type 2 diabetes mellitus (HCC) A1c improved, continue diet modifications. Routine labs ordered for upcoming annual wellness visit.  - POCT glycosylated hemoglobin (Hb A1C) - Urine Microalbumin w/creat. ratio - CBC with Differential/Platelet - Lipid Profile - CMP14+EGFR  2. Essential hypertension Continue treatment regimen as prescribed. Followed by cardiology - losartan (COZAAR) 25 MG tablet; Take 1 tablet (25 mg total) by mouth daily.  3. Aortic atherosclerosis (HCC) Routine labs ordered. Continue medications as prescribed.  - CBC with Differential/Platelet - Lipid Profile - CMP14+EGFR   General Counseling: roslynn becks understanding of the findings of todays visit and agrees with plan of treatment. I have discussed any further diagnostic evaluation that may  be needed or ordered today. We also reviewed her medications today. she has been encouraged to call the office with any questions or concerns that should arise related to todays visit.    Orders Placed This Encounter  Procedures   Urine Microalbumin w/creat. ratio   CBC with Differential/Platelet   Lipid Profile   CMP14+EGFR   POCT glycosylated hemoglobin (Hb A1C)    Meds ordered this encounter  Medications   losartan (COZAAR) 25 MG tablet    Sig: Take 1 tablet (25 mg total) by mouth daily.    Return for previously scheduled, AWV,  PCP in november, may do labs prior to cardio visit in september.   Total time spent:30 Minutes Time spent includes review of chart, medications, test results, and follow up plan with the patient.   [place] Controlled Substance Database was reviewed by me.  This patient was seen by Sallyanne Kuster, FNP-C in collaboration with Dr. Beverely Risen as a part of collaborative care agreement.    R. Tedd Sias, MSN, FNP-C Internal medicine

## 2023-02-11 ENCOUNTER — Telehealth: Payer: PPO | Admitting: Nurse Practitioner

## 2023-02-11 ENCOUNTER — Telehealth: Payer: Self-pay | Admitting: Nurse Practitioner

## 2023-02-11 ENCOUNTER — Encounter: Payer: Self-pay | Admitting: Nurse Practitioner

## 2023-02-11 VITALS — Temp 100.5°F | Resp 16 | Ht 66.5 in | Wt 156.0 lb

## 2023-02-11 DIAGNOSIS — J0110 Acute frontal sinusitis, unspecified: Secondary | ICD-10-CM | POA: Diagnosis not present

## 2023-02-11 MED ORDER — DOXYCYCLINE HYCLATE 100 MG PO CAPS
100.0000 mg | ORAL_CAPSULE | Freq: Two times a day (BID) | ORAL | 0 refills | Status: AC
Start: 2023-02-11 — End: 2023-02-21

## 2023-02-11 NOTE — Progress Notes (Signed)
[facility] [address] [place], [place] [address]  Internal MEDICINE  Telephone Visit  Patient Name: Toni [name]  [id number]  [id number]  Date of Service: 02/11/2023  I connected with the patient at 1425 by telephone and verified the patients identity using two identifiers.   I discussed the limitations, risks, security and privacy concerns of performing an evaluation and management service by telephone and the availability of in person appointments. I also discussed with the patient that there may be a patient responsible charge related to the service.  The patient expressed understanding and agrees to proceed.    Chief Complaint  Patient presents with   Telephone Screen    Sinusitis and coughing and ache and just wanna sleep, husband sick as well no covid  test.    Telephone Assessment    HPI Toni [name] presents for a telehealth virtual visit for symptoms of sinusitis No covid test Onset of symptoms was 2 days ago.  Reports cough, body aches, fatigue, and fever. Lack of appetite, sore throat   Current Medication: Outpatient Encounter Medications as of 02/11/2023  Medication Sig   aspirin 81 MG tablet Take 81 mg by mouth daily.   Bempedoic Acid-Ezetimibe (NEXLIZET) 180-10 MG TABS Take 1 tablet by mouth daily.   Calcium Carbonate-Vitamin D (CALCIUM + D PO) Take 1 tablet by mouth daily.    cetirizine (ZYRTEC) 10 MG tablet Take 10 mg by mouth as needed.    chlorthalidone (HYGROTON) 25 MG tablet TAKE ONE TABLET BY MOUTH ONCE A DAY   cholecalciferol (VITAMIN D) 1000 units tablet Take 1,000 Units by mouth daily.   doxycycline (VIBRAMYCIN) 100 MG capsule Take 1 capsule (100 mg total) by mouth 2 (two) times daily for 10 days. Take with food   fenofibrate 54 MG tablet TAKE ONE TABLET BY MOUTH EVERY DAY FOR ELEVATED CHOLESTEROL   losartan (COZAAR) 25 MG tablet Take 1 tablet (25 mg total) by mouth daily.   meclizine (ANTIVERT) 25 MG tablet Take 1 tablet (25 mg total) by mouth  3 (three) times daily as needed for dizziness.   Omega-3 Fatty Acids (OMEGA 3 PO) Take 1 capsule by mouth daily.   pantoprazole (PROTONIX) 40 MG tablet TAKE ONE TABLET BY MOUTH ONCE A DAY   spironolactone (ALDACTONE) 25 MG tablet Take 1 tablet (25 mg) by mouth twice daily   SYNTHROID 112 MCG tablet TAKE ONE TABLET BY MOUTH EVERY MORNING   [DISCONTINUED] doxycycline (VIBRA-TABS) 100 MG tablet Take 1 tablet (100 mg total) by mouth 2 (two) times daily. Take with food.   No facility-administered encounter medications on file as of 02/11/2023.    Surgical History: Past Surgical History:  Procedure Laterality Date   APPENDECTOMY     BUNIONECTOMY     right foot   CARDIAC CATHETERIZATION  1994   [facility]    CATARACT EXTRACTION, BILATERAL Left 10/20/2017   CATARACT EXTRACTION, BILATERAL Right 10/27/2017   CHOLECYSTECTOMY     COLONOSCOPY  05/25/2016   Dr.Stark   COLONOSCOPY     March 2023   HAMMER TOE SURGERY  12/2012   left toe   PARTIAL HYSTERECTOMY     POLYPECTOMY     ROBOTIC ASSISTED SALPINGO OOPHERECTOMY     right ovary first removed then left ovary removed years later   TONSILLECTOMY  age 16   TOTAL THYROIDECTOMY  09/2011    Medical History: Past Medical History:  Diagnosis Date   Allergic rhinitis    Allergy  Aortic atherosclerosis (HCC)    Diabetes mellitus, type 2 (HCC)    no medicines- was pre diabtes- diet controlled    Diverticulitis    Essential hypertension    GERD (gastroesophageal reflux disease)    Hyperlipidemia    Hypertension    Hypothyroidism    Obesity    Osteoporosis    Sleep apnea    mild -no cpap   Transient insomnia    Tubular adenoma of colon 05/2013    Family History: Family History  Problem Relation Age of Onset   Throat cancer Father    Esophageal cancer Father    Colon cancer Maternal Aunt    Colon polyps Maternal Aunt    Colon cancer Maternal Uncle    Colon polyps Maternal Uncle    Heart disease Maternal Uncle    Stomach  cancer Maternal Uncle    Prostate cancer Maternal Uncle    Breast cancer Cousin    Diabetes Neg Hx    Kidney disease Neg Hx    Gallbladder disease Neg Hx    Rectal cancer Neg Hx     Social History   Socioeconomic History   Marital status: Married    Spouse name: Not on file   Number of children: 2   Years of education: Not on file   Highest education level: Not on file  Occupational History   Occupation: Retired  Tobacco Use   Smoking status: Former    Current packs/day: 0.00    Average packs/day: 1 pack/day for 30.0 years (30.0 ttl pk-yrs)    Types: Cigarettes    Start date: 07/06/1968    Quit date: 07/06/1998    Years since quitting: 24.6   Smokeless tobacco: Never  Vaping Use   Vaping status: Never Used  Substance and Sexual Activity   Alcohol use: No    Alcohol/week: 0.0 standard drinks of alcohol   Drug use: No   Sexual activity: Not on file  Other Topics Concern   Not on file  Social History Narrative   Not on file   Social Determinants of Health   Financial Resource Strain: Low Risk  (12/24/2020)   Overall Financial Resource Strain (CARDIA)    Difficulty of Paying Living Expenses: Not hard at all  Food Insecurity: Not on file  Transportation Needs: Not on file  Physical Activity: Not on file  Stress: Not on file  Social Connections: Not on file  Intimate Partner Violence: Not on file      Review of Systems  Constitutional:  Positive for appetite change, chills, fatigue and fever.  HENT:  Positive for congestion, postnasal drip, rhinorrhea, sinus pressure, sinus pain and sore throat.   Respiratory:  Positive for cough. Negative for chest tightness, shortness of breath and wheezing.   Cardiovascular: Negative.  Negative for chest pain and palpitations.  Gastrointestinal: Negative.  Negative for abdominal pain, constipation, diarrhea, nausea and vomiting.  Musculoskeletal:  Positive for myalgias.  Neurological:  Positive for headaches.    Vital  Signs: Temp (!) 100.5 F (38.1 C)   Resp 16   Ht 5' 6.5" (1.689 m)   Wt 156 lb (70.8 kg)   BMI 24.80 kg/m    Observation/Objective: She is alert and oriented and engages in conversation appropriately. No acute distress noted.     Assessment/Plan: 1. Acute non-recurrent frontal sinusitis 10 day course of doxycycline prescribed. Call clinic if no improvement or symptoms worsen - doxycycline (VIBRAMYCIN) 100 MG capsule; Take 1 capsule (100 mg total) by  mouth 2 (two) times daily for 10 days. Take with food  Dispense: 20 capsule; Refill: 0   General Counseling: belma daddario understanding of the findings of today's phone visit and agrees with plan of treatment. I have discussed any further diagnostic evaluation that may be needed or ordered today. We also reviewed her medications today. she has been encouraged to call the office with any questions or concerns that should arise related to todays visit.  Return if symptoms worsen or fail to improve.   No orders of the defined types were placed in this encounter.   Meds ordered this encounter  Medications   doxycycline (VIBRAMYCIN) 100 MG capsule    Sig: Take 1 capsule (100 mg total) by mouth 2 (two) times daily for 10 days. Take with food    Dispense:  20 capsule    Refill:  0    Time spent:20 Minutes Time spent with patient included reviewing progress notes, labs, imaging studies, and discussing plan for follow up.  [place] Controlled Substance Database was reviewed by me for overdose risk score (ORS) if appropriate.  This patient was seen by Sallyanne Kuster, FNP-C in collaboration with Dr. Beverely Risen as a part of collaborative care agreement.   R. Tedd Sias, MSN, FNP-C Internal medicine

## 2023-02-11 NOTE — Telephone Encounter (Signed)
Lvm to schedule virtual app-Toni

## 2023-02-15 DIAGNOSIS — H26491 Other secondary cataract, right eye: Secondary | ICD-10-CM | POA: Diagnosis not present

## 2023-02-15 DIAGNOSIS — Z961 Presence of intraocular lens: Secondary | ICD-10-CM | POA: Diagnosis not present

## 2023-02-15 DIAGNOSIS — H04123 Dry eye syndrome of bilateral lacrimal glands: Secondary | ICD-10-CM | POA: Diagnosis not present

## 2023-02-15 DIAGNOSIS — H26492 Other secondary cataract, left eye: Secondary | ICD-10-CM | POA: Diagnosis not present

## 2023-02-23 ENCOUNTER — Ambulatory Visit (HOSPITAL_BASED_OUTPATIENT_CLINIC_OR_DEPARTMENT_OTHER): Payer: PPO | Admitting: Pulmonary Disease

## 2023-02-23 ENCOUNTER — Encounter (HOSPITAL_BASED_OUTPATIENT_CLINIC_OR_DEPARTMENT_OTHER): Payer: Self-pay | Admitting: Pulmonary Disease

## 2023-02-23 VITALS — BP 122/70 | HR 73 | Resp 14 | Ht 66.5 in | Wt 156.0 lb

## 2023-02-23 DIAGNOSIS — R053 Chronic cough: Secondary | ICD-10-CM | POA: Diagnosis not present

## 2023-02-23 NOTE — Patient Instructions (Signed)
Trial of chlorpheniramine 4 mg at bedtime x 3 weeks + phenylephrine 10 mg daytime  (combination [employer] brand 'sinus PE')   Report back in 3 weeks & let me know  Based on that we can order more testing : Swallowing study PFTs

## 2023-02-23 NOTE — Assessment & Plan Note (Signed)
This cough has been ongoing for more than 5 years.  CT scan does not show any evidence of lung malignancy or pulmonary fibrosis. Usual suspects include postnasal drip syndrome/upper airway cough versus GERD.  She has been treated with PPI more recently she reports dysphagia to solids.  She has evidence of damage to her sinuses after an implant procedure I would first undertake treatment with question ration antihistaminic and decongestant combination for 3 weeks.  If she is substantially improved this would indicate a good component of upper airway cough syndrome. If there is no improvement then we can test further with PFTs and esophagram given her recent symptom of dysphagia to solids

## 2023-02-23 NOTE — Progress Notes (Signed)
Subjective:    Patient ID: Toni [name], female    DOB: 04-17-1946, 77 y.o.   MRN: [id number]  HPI 77 year old remote smoker presents for evaluation of chronic cough ongoing for more than 5 years She smoked a pack per day, about 30 pack years, quit 2000 ACE inhibitor was stopped 09/2021 due to chronic cough  PMH :  DM2,  HTN, HLD,  hypothyroidism, mild sleep apnea   She denies dyspnea on exertion.  She is able to mow the yard using a Firefighter.  She does most of the work around the house since her husband had surgery for lung cancer and is on oxygen.  He is my patient. She reports COVID infection 3 weeks ago.  She has been treated with Protonix for GERD.  She reports dysphagia especially to solids about a month ago. She reports occasional sinus symptoms.  She reports injury to her sinuses after an implant procedure  She has not used OTC cough syrup recently.  She denies nocturnal symptoms.  She has never used MDIs   Significant tests/ events reviewed  05/2018 CT sinuses -bilateral ethmoid sinusitis, left ostiomeatal unit obstruction, left maxillary sinus floor dehiscence CT chest Wo con 10/2022 clear lungs, calcified coronaries  Past Medical History:  Diagnosis Date   Allergic rhinitis    Allergy    Aortic atherosclerosis (HCC)    Diabetes mellitus, type 2 (HCC)    no medicines- was pre diabtes- diet controlled    Diverticulitis    Essential hypertension    GERD (gastroesophageal reflux disease)    Hyperlipidemia    Hypertension    Hypothyroidism    Obesity    Osteoporosis    Sleep apnea    mild -no cpap   Transient insomnia    Tubular adenoma of colon 05/2013   Past Surgical History:  Procedure Laterality Date   APPENDECTOMY     BUNIONECTOMY     right foot   CARD[facility]AC CAT[facility]TERIZATION  1994   [facility]    CATARACT EXTRACTION, BILATERAL Left 10/20/2017   CATARACT EXTRACTION, BILATERAL Right 10/27/2017   CHOLECYSTECTOMY     COLONOSCOPY  05/25/2016    Dr.Stark   COLONOSCOPY     March 2023   HAMMER TOE SURGERY  12/2012   left toe   PARTIAL HYSTERECTOMY     POLYPECTOMY     ROBOTIC ASSISTED SALPINGO OOPHERECTOMY     right ovary first removed then left ovary removed years later   TONSILLECTOMY  age 32   TOTAL THYROIDECTOMY  09/2011    Allergies  Allergen Reactions   Cefaclor Other (See Comments)    REACTION: swelling/hives   Penicillins     REACTION: swelling/hives   Talwin [Pentazocine]     Social History   Socioeconomic History   Marital status: Married    Spouse name: Not on file   Number of children: 2   Years of education: Not on file   Highest education level: Not on file  Occupational History   Occupation: Retired  Tobacco Use   Smoking status: Former    Current packs/day: 0.00    Average packs/day: 1 pack/day for 30.0 years (30.0 ttl pk-yrs)    Types: Cigarettes    Start date: 07/06/1968    Quit date: 07/06/1998    Years since quitting: 24.6   Smokeless tobacco: Never  Vaping Use   Vaping status: Never Used  Substance and Sexual Activity   Alcohol use: No    Alcohol/week: 0.0 standard  drinks of alcohol   Drug use: No   Sexual activity: Not on file  Other Topics Concern   Not on file  Social History Narrative   Not on file   Social Determinants of Health   Financial Resource Strain: Low Risk  (12/24/2020)   Overall Financial Resource Strain (CARDIA)    Difficulty of Paying Living Expenses: Not hard at all  Food Insecurity: Not on file  Transportation Needs: Not on file  Physical Activity: Not on file  Stress: Not on file  Social Connections: Not on file  Intimate Partner Violence: Not on file    Family History  Problem Relation Age of Onset   Throat cancer Father    Esophageal cancer Father    Colon cancer Maternal Aunt    Colon polyps Maternal Aunt    Colon cancer Maternal Uncle    Colon polyps Maternal Uncle    Heart disease Maternal Uncle    Stomach cancer Maternal Uncle    Prostate  cancer Maternal Uncle    Breast cancer Cousin    Diabetes Neg Hx    Kidney disease Neg Hx    Gallbladder disease Neg Hx    Rectal cancer Neg Hx      Review of Systems Constitutional: negative for anorexia, fevers and sweats  Eyes: negative for irritation, redness and visual disturbance  Ears, nose, mouth, throat, and face: negative for earaches, epistaxis, nasal congestion and sore throat  Respiratory: negative for  dyspnea on exertion, sputum and wheezing  Cardiovascular: negative for chest pain, dyspnea, lower extremity edema, orthopnea, palpitations and syncope  Gastrointestinal: negative for abdominal pain, constipation, diarrhea, melena, nausea and vomiting  Genitourinary:negative for dysuria, frequency and hematuria  Hematologic/lymphatic: negative for bleeding, easy bruising and lymphadenopathy  Musculoskeletal:negative for arthralgias, muscle weakness and stiff joints  Neurological: negative for coordination problems, gait problems, headaches and weakness  Endocrine: negative for diabetic symptoms including polydipsia, polyuria and weight loss     Objective:   Physical Exam  Gen. Pleasant, well-nourished, in no distress, normal affect ENT - no pallor,icterus, no post nasal drip Neck: No JVD, no thyromegaly, no carotid bruits Lungs: no use of accessory muscles, no dullness to percussion, clear without rales or rhonchi  Cardiovascular: Rhythm regular, heart sounds  normal, no murmurs or gallops, no peripheral edema Abdomen: soft and non-tender, no hepatosplenomegaly, BS normal. Musculoskeletal: No deformities, no cyanosis or clubbing Neuro:  alert, non focal       Assessment & Plan:

## 2023-02-24 ENCOUNTER — Other Ambulatory Visit: Payer: Self-pay | Admitting: Cardiovascular Disease

## 2023-03-22 DIAGNOSIS — E119 Type 2 diabetes mellitus without complications: Secondary | ICD-10-CM | POA: Diagnosis not present

## 2023-03-29 NOTE — Progress Notes (Unsigned)
Cardiology Office Note    Date:  03/30/2023   ID:  Zera, Ammann 1945-08-23, MRN [id number]  PCP:  Sallyanne Kuster, NP  Cardiologist:  Lorine Bears, MD  Electrophysiologist:  None   Chief Complaint: Follow-up  History of Present Illness:   Toni [name] is a 77 y.o. female with history of coronary artery calcification, aortic atherosclerosis, DM2, HTN, HLD, hypothyroidism, obesity, and mild sleep apnea that does not require CPAP usage who presents for follow-up of Lexiscan MPI.   She was previously evaluated by Dr. Kirke Corin in 2016 for atypical chest pain.  CT of the abdomen showed aortic atherosclerosis.  Carotid Doppler showed mild intimal thickening with calcified plaque bilaterally.  ABI was normal bilaterally.  Echo showed normal LV systolic function with no significant valvular abnormalities.  Lexiscan MPI showed no evidence of ischemia with a normal EF and was overall low risk.  She was referred back to Dr. Kirke Corin in 09/2021 for evaluation of hypertension with intolerance to multiple medications including ACE inhibitor and ARB, due to dry cough.  It was noted she had previously been on amlodipine and diltiazem, though these did not adequately control her blood pressure.  She had previously been on beta-blocker for migraines, though this led to bradycardia.  She was subsequently placed on nifedipine, though noted increased lower extremity swelling involving the left leg with this.  She had been on HCTZ for quite a while and was tolerating this medication.  She reported her blood pressure became uncontrolled over the preceding year.  There was some mild exertional dyspnea and fatigue.  She was without chest pain.  Blood pressure in the office was elevated at 152/80.  Elevated BP was felt to likely be essential in etiology.  Given lower extremity swelling, nifedipine was stopped, HCTZ was transitioned to chlorthalidone, and carvedilol was added.  Given fatigue and dyspnea, as well as  murmur suggestive of aortic sclerosis on exam, she underwent echo on 09/30/2021 which demonstrated an EF of 60 to 65%, no regional wall motion abnormalities, grade 1 diastolic dysfunction, normal RV systolic function and ventricular cavity size, mild mitral regurgitation, and an estimated right atrial pressure of 3 mmHg.  She was seen in follow up on 10/07/2021 and was doing well.  Since initiating carvedilol and transitioning HCTZ to chlorthalidone, she continued to note elevated BP readings typically in the 150s to 170s systolic.  However, since undergoing these medication changes she had only had one systolic blood pressure reading greater than 200 mmHg.  She did note some increase in fatigue following the initiation of carvedilol.  Following the discontinuation of nifedipine she noted resolution of lower extremity swelling.  Coreg was titrated to 12.5 mg bid and she was continued on chlorthalidone 25 mg.  Renal artery ultrasound showed no evidence of RAS bilaterally.  Rennin aldosterone and plasma metanephrines were normal.     She was seen in the office in 11/2021 and noted improvement in BP readings at home with most readings in the 140s systolic.  She was adjusting to some fatigue associated with carvedilol.  She was seen in the office on 05/14/2022 and continued to note fatigue following the initiation and subsequent titration of carvedilol.  Blood pressure readings at home were in the 160s to 180s systolic.  She preferred to discontinue carvedilol.  Despite discontinuation of ACE inhibitor/ARB she continued to note a cough.  Blood pressure in the office was 140/80.  Carvedilol was discontinued and she was initiated on spironolactone 25  mg daily.   She was seen in the office on 06/08/2022 and noted an improvement in her fatigue following discontinuation of carvedilol and felt like her energy was back to baseline.  Cough persisted despite discontinuation of ACE inhibitor and ARB and has been present since her  COVID illness.  She also reported a recent infection with RSV which she felt like was contributing to her cough.  Blood pressure in the office was 166/82, spironolactone was titrated to 25 mg twice daily.    She was seen in the office in 08/2022 and underwent a rechallenge of losartan 25 mg daily with continuation of spironolactone 25 mg twice daily and chlorthalidone 25 mg daily.     She was seen in the office in 10/2022 and losartan was titrated to 25 mg twice daily with continuation of chlorthalidone 25 mg daily and spironolactone 25 mg twice daily.  She underwent CT of the chest for chronic cough which demonstrated multivessel coronary artery calcification, aortic atherosclerosis, and mild bronchial wall thickening.  In this setting, she was evaluated in 11/2022 and reported a slight increase in exertional dyspnea with activity such as mowing the lawn.  Following titration of losartan, she had some positional dizziness leading her to decrease back to 25 mg daily.  Lexiscan MPI 12/04/2022 showed no evidence of ischemia or infarction with an EF of 71%.  CT attenuated weighted images that showed mild aortic atherosclerosis.  Abnormal EKG at baseline with deeply inverted T waves in the inferior leads.  Overall, this was a low risk study.  She is undergoing evaluation by pulmonology for chronic cough.  She comes in doing well from a cardiac perspective and is without symptoms of angina or cardiac decompensation.  No dizziness, presyncope, or syncope.  Blood pressure has been reasonably controlled, and was well-controlled at recent pulmonology visit.  Chronic cough persists and may be a little improved.  She does note some cramping along her feet after working out in the yard at her church recently.  No lower extremity swelling or progressive orthopnea.  Tolerating recently initiated Nexlizet.  Does eat a lot of carbs.    Labs independently reviewed: 03/2023 - BUN 23, serum creatinine 1.07, potassium 4.5,  albumin 4.4, AST/ALT normal, TC 214, TG 306, HDL 36, LDL 124, Hgb 14.5, PLT 235 02/2023 - A1c 6.4 06/2022 - TSH 0.110, free T4 normal  Past Medical History:  Diagnosis Date   Allergic rhinitis    Allergy    Aortic atherosclerosis (HCC)    Diabetes mellitus, type 2 (HCC)    no medicines- was pre diabtes- diet controlled    Diverticulitis    Essential hypertension    GERD (gastroesophageal reflux disease)    Hyperlipidemia    Hypertension    Hypothyroidism    Obesity    Osteoporosis    Sleep apnea    mild -no cpap   Transient insomnia    Tubular adenoma of colon 05/2013    Past Surgical History:  Procedure Laterality Date   APPENDECTOMY     BUNIONECTOMY     right foot   CARDIAC CATHETERIZATION  1994   [facility]    CATARACT EXTRACTION, BILATERAL Left 10/20/2017   CATARACT EXTRACTION, BILATERAL Right 10/27/2017   CHOLECYSTECTOMY     COLONOSCOPY  05/25/2016   Dr.Stark   COLONOSCOPY     March 2023   HAMMER TOE SURGERY  12/2012   left toe   PARTIAL HYSTERECTOMY     POLYPECTOMY  ROBOTIC ASSISTED SALPINGO OOPHERECTOMY     right ovary first removed then left ovary removed years later   TONSILLECTOMY  age 32   TOTAL THYROIDECTOMY  09/2011    Current Medications: Current Meds  Medication Sig   aspirin 81 MG tablet Take 81 mg by mouth daily.   Bempedoic Acid-Ezetimibe (NEXLIZET) 180-10 MG TABS Take 1 tablet by mouth daily.   Calcium Carbonate-Vitamin D (CALCIUM + D PO) Take 1 tablet by mouth daily.    cetirizine (ZYRTEC) 10 MG tablet Take 10 mg by mouth as needed.    chlorthalidone (HYGROTON) 25 MG tablet TAKE ONE TABLET BY MOUTH ONCE A DAY   cholecalciferol (VITAMIN D) 1000 units tablet Take 1,000 Units by mouth daily.   icosapent Ethyl (VASCEPA) 1 g capsule Take 2 capsules (2 g total) by mouth 2 (two) times daily.   losartan (COZAAR) 25 MG tablet Take 1 tablet (25 mg total) by mouth daily.   meclizine (ANTIVERT) 25 MG tablet Take 1 tablet (25 mg total) by mouth 3  (three) times daily as needed for dizziness.   Omega-3 Fatty Acids (OMEGA 3 PO) Take 1 capsule by mouth daily.   pantoprazole (PROTONIX) 40 MG tablet TAKE ONE TABLET BY MOUTH ONCE A DAY   spironolactone (ALDACTONE) 25 MG tablet Take 1 tablet (25 mg) by mouth twice daily   SYNTHROID 112 MCG tablet TAKE ONE TABLET BY MOUTH EVERY MORNING   [DISCONTINUED] fenofibrate 54 MG tablet TAKE ONE TABLET BY MOUTH EVERY DAY FOR ELEVATED CHOLESTEROL    Allergies:   Cefaclor, Penicillins, and Talwin [pentazocine]   Social History   Socioeconomic History   Marital status: Married    Spouse name: Not on file   Number of children: 2   Years of education: Not on file   Highest education level: Not on file  Occupational History   Occupation: Retired  Tobacco Use   Smoking status: Former    Current packs/day: 0.00    Average packs/day: 1 pack/day for 30.0 years (30.0 ttl pk-yrs)    Types: Cigarettes    Start date: 07/06/1968    Quit date: 07/06/1998    Years since quitting: 24.7   Smokeless tobacco: Never  Vaping Use   Vaping status: Never Used  Substance and Sexual Activity   Alcohol use: No    Alcohol/week: 0.0 standard drinks of alcohol   Drug use: No   Sexual activity: Not on file  Other Topics Concern   Not on file  Social History Narrative   Not on file   Social Determinants of Health   Financial Resource Strain: Low Risk  (12/24/2020)   Overall Financial Resource Strain (CARDIA)    Difficulty of Paying Living Expenses: Not hard at all  Food Insecurity: Not on file  Transportation Needs: Not on file  Physical Activity: Not on file  Stress: Not on file  Social Connections: Not on file     Family History:  The patient's family history includes Breast cancer in her cousin; Colon cancer in her maternal aunt and maternal uncle; Colon polyps in her maternal aunt and maternal uncle; Esophageal cancer in her father; Heart disease in her maternal uncle; Prostate cancer in her maternal uncle;  Stomach cancer in her maternal uncle; Throat cancer in her father. There is no history of Diabetes, Kidney disease, Gallbladder disease, or Rectal cancer.  ROS:   12-point review of systems is negative unless otherwise noted in the HPI.   EKGs/Labs/Other Studies Reviewed:  Studies reviewed were summarized above. The additional studies were reviewed today:  Lexiscan MPI 12/04/2022:   The study is normal. The study is low risk.   No ST deviation was noted.   LV perfusion is normal. There is no evidence of ischemia. There is no evidence of infarction.   Left ventricular function is normal. Nuclear stress EF: 71 %. The left ventricular ejection fraction is hyperdynamic (>65%). End diastolic cavity size is normal. End systolic cavity size is normal.   CT attenuation images shows mild aortic calcifications.   Abnormal baseline EKG with deeply inverted T waves in the inferior leads. __________  Renal artery ultrasound 10/10/2021: Renal:    Right: Normal size right kidney. Normal right Resisitive Index.         Normal cortical thickness of right kidney. RRV flow present.         No evidence of right renal artery stenosis.  Left:  LRV flow present. No evidence of left renal artery stenosis.         Normal size of left kidney. Normal left Resistive Index.         Normal cortical thickness of the left kidney.  Mesenteric:  Normal Celiac artery and Superior Mesenteric artery findings.  __________   2D echo 09/30/2021: 1. Left ventricular ejection fraction, by estimation, is 60 to 65%. The  left ventricle has normal function. The left ventricle has no regional  wall motion abnormalities. Left ventricular diastolic parameters are  consistent with Grade I diastolic  dysfunction (impaired relaxation).   2. Right ventricular systolic function is normal. The right ventricular  size is normal.   3. The mitral valve is normal in structure. Mild mitral valve  regurgitation. No evidence of mitral  stenosis.   4. The aortic valve is tricuspid. Aortic valve regurgitation is not  visualized. No aortic stenosis is present.   5. The inferior vena cava is normal in size with greater than 50%  respiratory variability, suggesting right atrial pressure of 3 mmHg. __________   Eugenie Birks MPI 12/19/2014: Pharmacological myocardial perfusison imaging study with no significnat ischemia. The left ventricular ejection fraction is hyperdynamic (>65%). No wall motion abnormality noted. No EKG changes concerning for ischemia. This is a low risk study.   EKG:  EKG is not ordered today.    Recent Labs: 06/22/2022: TSH 0.110 03/22/2023: ALT 23; BUN 23; Creatinine, Ser 1.07; Hemoglobin 14.5; Platelets 235; Potassium 4.5; Sodium 142  Recent Lipid Panel    Component Value Date/Time   CHOL 214 (H) 03/22/2023 0938   TRIG 306 (H) 03/22/2023 0938   HDL 36 (L) 03/22/2023 0938   CHOLHDL 5.9 (H) 03/22/2023 0938   LDLCALC 124 (H) 03/22/2023 0938    PHYSICAL EXAM:    VS:  BP (!) 142/72 (BP Location: Left Arm, Patient Position: Sitting, Cuff Size: Normal)   Pulse 76   Ht 5' 6.5" (1.689 m)   Wt 155 lb (70.3 kg)   SpO2 96%   BMI 24.64 kg/m   BMI: Body mass index is 24.64 kg/m.  Physical Exam Vitals reviewed.  Constitutional:      Appearance: She is well-developed.  HENT:     Head: Normocephalic and atraumatic.  Eyes:     General:        Right eye: No discharge.        Left eye: No discharge.  Neck:     Vascular: No JVD.  Cardiovascular:     Rate and Rhythm: Normal rate and regular rhythm.  Heart sounds: S1 normal and S2 normal. Heart sounds not distant. No midsystolic click and no opening snap. Murmur heard.     Systolic murmur is present with a grade of 1/6 at the upper left sternal border.     No friction rub.  Pulmonary:     Effort: Pulmonary effort is normal. No respiratory distress.     Breath sounds: Normal breath sounds. No decreased breath sounds, wheezing, rhonchi or rales.   Chest:     Chest wall: No tenderness.  Abdominal:     General: There is no distension.  Musculoskeletal:     Cervical back: Normal range of motion.     Right lower leg: No edema.     Left lower leg: No edema.  Skin:    General: Skin is warm and dry.     Nails: There is no clubbing.  Neurological:     Mental Status: She is alert and oriented to person, place, and time.  Psychiatric:        Speech: Speech normal.        Behavior: Behavior normal.        Thought Content: Thought content normal.        Judgment: Judgment normal.     Wt Readings from Last 3 Encounters:  03/30/23 155 lb (70.3 kg)  02/23/23 156 lb (70.8 kg)  02/11/23 156 lb (70.8 kg)     ASSESSMENT & PLAN:   Coronary artery calcification with exertional dyspnea/aortic atherosclerosis/HLD and hypertriglyceridemia with statin intolerance: No symptoms of chest pain or cardiac decompensation.  Recent Lexiscan MPI showed no evidence of high risk ischemia and was overall low risk.  She is intolerant to statins secondary to abnormal liver function testing.  Historically, she has been managed with ezetimibe and fenofibrate, though was transitioned to Nexlizet at her last visit with noted coronary artery calcification.  LDL remains above goal at 295 with triglyceride greater than 300 (a fasting sample) earlier this month.  Unable to prescribe PCSK9 inhibitor secondary to financial constraints.  Discontinue fenofibrate, start Vascepa 2 g twice daily.  She will work on cutting out carbs and maintaining a heart healthy diet.  May need to consider inclisiran.  Recheck fasting lipid panel and LFT in 2 months.  HTN: Blood pressure is mildly elevated in the office today, though has recently been well-controlled.  Remains on chlorthalidone 25 mg, and spironolactone 25 mg twice daily.  No longer on calcium channel blocker due to lower extremity swelling and beta-blocker with fatigue.  Low-sodium diet recommended.  Chronic cough:  Longstanding issue and has persisted despite medication holidays of ACE inhibitor/ARB's, as well as with PPI for GERD.  Evaluation by pulmonology.   Disposition: F/u with Dr. Kirke Corin or an APP in 2 months.   Medication Adjustments/Labs and Tests Ordered: Current medicines are reviewed at length with the patient today.  Concerns regarding medicines are outlined above. Medication changes, Labs and Tests ordered today are summarized above and listed in the Patient Instructions accessible in Encounters.   SignedEula Listen, PA-C 03/30/2023 11:27 AM     [facility] [place] [address] [place], [place] [address] [phone]

## 2023-03-30 ENCOUNTER — Ambulatory Visit: Payer: PPO | Attending: Physician Assistant | Admitting: Physician Assistant

## 2023-03-30 ENCOUNTER — Encounter: Payer: Self-pay | Admitting: Physician Assistant

## 2023-03-30 VITALS — BP 142/72 | HR 76 | Ht 66.5 in | Wt 155.0 lb

## 2023-03-30 DIAGNOSIS — I2510 Atherosclerotic heart disease of native coronary artery without angina pectoris: Secondary | ICD-10-CM

## 2023-03-30 DIAGNOSIS — R053 Chronic cough: Secondary | ICD-10-CM | POA: Diagnosis not present

## 2023-03-30 DIAGNOSIS — I700 Atherosclerosis of aorta: Secondary | ICD-10-CM

## 2023-03-30 DIAGNOSIS — I2584 Coronary atherosclerosis due to calcified coronary lesion: Secondary | ICD-10-CM

## 2023-03-30 DIAGNOSIS — I10 Essential (primary) hypertension: Secondary | ICD-10-CM | POA: Diagnosis not present

## 2023-03-30 DIAGNOSIS — E785 Hyperlipidemia, unspecified: Secondary | ICD-10-CM

## 2023-03-30 DIAGNOSIS — Z79899 Other long term (current) drug therapy: Secondary | ICD-10-CM

## 2023-03-30 MED ORDER — ICOSAPENT ETHYL 1 G PO CAPS: 2.0000 g | ORAL_CAPSULE | Freq: Two times a day (BID) | ORAL | 3 refills | Status: DC

## 2023-03-30 NOTE — Patient Instructions (Signed)
Medication Instructions:  Your physician recommends the following medication changes.  STOP TAKING: Fenofibrate  START TAKING: Vascepa 2 grams by mouth twice daily  *If you need a refill on your cardiac medications before your next appointment, please call your pharmacy*   Lab Work: Your provider would like for you to return in 2 months to have the following labs drawn: Fasting Lipids and Liver Function test.   Please go to [facility] [address] ([facility] [address], [place] [address] You do not need an appointment.  They are open from 7:30 am-4 pm.  Lunch from 1:00 pm- 2:00 pm You DO need to be fasting.   You may also go to any of these [facility] locations:  [employer]  - [address] [employer] [address] Chief Technology Officer)    If you have labs (blood work) drawn today and your tests are completely normal, you will receive your results only by: [employer] (if you have MyChart) OR A paper copy in the mail If you have any lab test that is abnormal or we need to change your treatment, we will call you to review the results.   Follow-Up: At [facility], you and your health needs are our priority.  As part of our continuing mission to provide you with exceptional heart care, we have created designated Provider Care Teams.  These Care Teams include your primary Cardiologist (physician) and Advanced Practice Providers (APPs -  Physician Assistants and Nurse Practitioners) who all work together to provide you with the care you need, when you need it.  We recommend signing up for the patient portal called "MyChart".  Sign up information is provided on this After Visit Summary.  MyChart is used to connect with patients for Virtual Visits (Telemedicine).  Patients are able to view lab/test results, encounter notes, upcoming appointments, etc.  Non-urgent messages can be sent to your provider as well.   To learn more about what you can do with  MyChart, go to [email or web address].    Your next appointment:   2 month(s)  Provider:   You may see Lorine Bears, MD or one of the following Advanced Practice Providers on your designated Care Team:   Eula Listen, [place]

## 2023-04-01 DIAGNOSIS — L578 Other skin changes due to chronic exposure to nonionizing radiation: Secondary | ICD-10-CM | POA: Diagnosis not present

## 2023-04-01 DIAGNOSIS — L814 Other melanin hyperpigmentation: Secondary | ICD-10-CM | POA: Diagnosis not present

## 2023-04-01 DIAGNOSIS — L988 Other specified disorders of the skin and subcutaneous tissue: Secondary | ICD-10-CM | POA: Diagnosis not present

## 2023-04-01 DIAGNOSIS — C44321 Squamous cell carcinoma of skin of nose: Secondary | ICD-10-CM | POA: Diagnosis not present

## 2023-04-02 ENCOUNTER — Telehealth (INDEPENDENT_AMBULATORY_CARE_PROVIDER_SITE_OTHER): Payer: PPO | Admitting: Nurse Practitioner

## 2023-04-02 ENCOUNTER — Encounter: Payer: Self-pay | Admitting: Nurse Practitioner

## 2023-04-02 VITALS — Ht 66.5 in | Wt 155.0 lb

## 2023-04-02 DIAGNOSIS — I10 Essential (primary) hypertension: Secondary | ICD-10-CM

## 2023-04-02 DIAGNOSIS — W57XXXA Bitten or stung by nonvenomous insect and other nonvenomous arthropods, initial encounter: Secondary | ICD-10-CM | POA: Diagnosis not present

## 2023-04-02 MED ORDER — HYDROXYZINE HCL 10 MG PO TABS
10.0000 mg | ORAL_TABLET | Freq: Three times a day (TID) | ORAL | 0 refills | Status: DC | PRN
Start: 2023-04-02 — End: 2023-08-08

## 2023-04-02 MED ORDER — MUPIROCIN 2 % EX OINT
1.0000 | TOPICAL_OINTMENT | Freq: Two times a day (BID) | CUTANEOUS | 0 refills | Status: DC
Start: 2023-04-02 — End: 2023-08-08

## 2023-04-02 NOTE — Progress Notes (Signed)
[facility] [address] [place], [place] [address]  Internal MEDICINE  Telephone Visit  Patient Name: Toni [name]  [id number]  [id number]  Date of Service: 04/02/2023  I connected with the patient at 0920 by telephone and verified the patients identity using two identifiers.   I discussed the limitations, risks, security and privacy concerns of performing an evaluation and management service by telephone and the availability of in person appointments. I also discussed with the patient that there may be a patient responsible charge related to the service.  The patient expressed understanding and agrees to proceed.    Chief Complaint  Patient presents with   Telephone Screen    [id number]   Telephone Assessment    Fire ants bites on both feet's  and hand  and itching     HPI Toni [name] presents for a telehealth virtual visit for fire ant bites  On feet and hands, very itchy Some of the ant bites have blistered and have drainage  Has tried putting vicks vapor rub on them and hydrocortisone cream which has helped some but she can tell she has been scratching in her sleep.   Current Medication: Outpatient Encounter Medications as of 04/02/2023  Medication Sig   aspirin 81 MG tablet Take 81 mg by mouth daily.   Bempedoic Acid-Ezetimibe (NEXLIZET) 180-10 MG TABS Take 1 tablet by mouth daily.   Calcium Carbonate-Vitamin D (CALCIUM + D PO) Take 1 tablet by mouth daily.    cetirizine (ZYRTEC) 10 MG tablet Take 10 mg by mouth as needed.    chlorthalidone (HYGROTON) 25 MG tablet TAKE ONE TABLET BY MOUTH ONCE A DAY   cholecalciferol (VITAMIN D) 1000 units tablet Take 1,000 Units by mouth daily.   hydrOXYzine (ATARAX) 10 MG tablet Take 1 tablet (10 mg total) by mouth 3 (three) times daily as needed for itching.   icosapent Ethyl (VASCEPA) 1 g capsule Take 2 capsules (2 g total) by mouth 2 (two) times daily.   losartan (COZAAR) 25 MG tablet Take 1 tablet (25 mg total) by mouth  daily.   mupirocin ointment (BACTROBAN) 2 % Apply 1 Application topically 2 (two) times daily.   pantoprazole (PROTONIX) 40 MG tablet TAKE ONE TABLET BY MOUTH ONCE A DAY   spironolactone (ALDACTONE) 25 MG tablet Take 1 tablet (25 mg) by mouth twice daily   SYNTHROID 112 MCG tablet TAKE ONE TABLET BY MOUTH EVERY MORNING   [DISCONTINUED] meclizine (ANTIVERT) 25 MG tablet Take 1 tablet (25 mg total) by mouth 3 (three) times daily as needed for dizziness.   [DISCONTINUED] Omega-3 Fatty Acids (OMEGA 3 PO) Take 1 capsule by mouth daily.   No facility-administered encounter medications on file as of 04/02/2023.    Surgical History: Past Surgical History:  Procedure Laterality Date   APPENDECTOMY     BUNIONECTOMY     right foot   CARDIAC CAT[facility]TERIZATION  1994   [facility]    CATARACT EXTRACTION, BILATERAL Left 10/20/2017   CATARACT EXTRACTION, BILATERAL Right 10/27/2017   CHOLECYSTECTOMY     COLONOSCOPY  05/25/2016   Dr.Stark   COLONOSCOPY     March 2023   HAMMER TOE SURGERY  12/2012   left toe   PARTIAL HYSTERECTOMY     POLYPECTOMY     ROBOTIC ASSISTED SALPINGO OOPHERECTOMY     right ovary first removed then left ovary removed years later   TONSILLECTOMY  age 77   TOTAL THYROIDECTOMY  09/2011    Medical History: Past  Medical History:  Diagnosis Date   Allergic rhinitis    Allergy    Aortic atherosclerosis (HCC)    Diabetes mellitus, type 2 (HCC)    no medicines- was pre diabtes- diet controlled    Diverticulitis    Essential hypertension    GERD (gastroesophageal reflux disease)    Hyperlipidemia    Hypertension    Hypothyroidism    Obesity    Osteoporosis    Sleep apnea    mild -no cpap   Transient insomnia    Tubular adenoma of colon 05/2013    Family History: Family History  Problem Relation Age of Onset   Throat cancer Father    Esophageal cancer Father    Colon cancer Maternal Aunt    Colon polyps Maternal Aunt    Colon cancer Maternal Uncle    Colon  polyps Maternal Uncle    Heart disease Maternal Uncle    Stomach cancer Maternal Uncle    Prostate cancer Maternal Uncle    Breast cancer Cousin    Diabetes Neg Hx    Kidney disease Neg Hx    Gallbladder disease Neg Hx    Rectal cancer Neg Hx     Social History   Socioeconomic History   Marital status: Married    Spouse name: Not on file   Number of children: 2   Years of education: Not on file   Highest education level: Not on file  Occupational History   Occupation: Retired  Tobacco Use   Smoking status: Former    Current packs/day: 0.00    Average packs/day: 1 pack/day for 30.0 years (30.0 ttl pk-yrs)    Types: Cigarettes    Start date: 07/06/1968    Quit date: 07/06/1998    Years since quitting: 24.7   Smokeless tobacco: Never  Vaping Use   Vaping status: Never Used  Substance and Sexual Activity   Alcohol use: No    Alcohol/week: 0.0 standard drinks of alcohol   Drug use: No   Sexual activity: Not on file  Other Topics Concern   Not on file  Social History Narrative   Not on file   Social Determinants of Health   Financial Resource Strain: Low Risk  (12/24/2020)   Overall Financial Resource Strain (CARDIA)    Difficulty of Paying Living Expenses: Not hard at all  Food Insecurity: Not on file  Transportation Needs: Not on file  Physical Activity: Not on file  Stress: Not on file  Social Connections: Not on file  Intimate Partner Violence: Not on file      Review of Systems  Constitutional: Negative.   Respiratory: Negative.  Negative for cough, chest tightness, shortness of breath and wheezing.   Cardiovascular: Negative.  Negative for chest pain and palpitations.  Skin:  Positive for wound (multiple ant bites, some are blistering and have drainage. the bites are located on both of her feet and her right arm.).    Vital Signs: Ht 5' 6.5" (1.689 m)   Wt 155 lb (70.3 kg)   BMI 24.64 kg/m    Observation/Objective: She is alert and oriented. No acute  distress noted on video    Assessment/Plan: 1. Nonvenomous insect bite of multiple sites, initial encounter Mupirocin prescribed to prevent further infection and aid in healing, use as prescribed. Until resolved. Hydroxyzine prescribed as needed for itching take as prescribed as needed. - mupirocin ointment (BACTROBAN) 2 %; Apply 1 Application topically 2 (two) times daily.  Dispense: 30 g; Refill:  0 - hydrOXYzine (ATARAX) 10 MG tablet; Take 1 tablet (10 mg total) by mouth 3 (three) times daily as needed for itching.  Dispense: 90 tablet; Refill: 0   General Counseling: parmida cogbill understanding of the findings of today's phone visit and agrees with plan of treatment. I have discussed any further diagnostic evaluation that may be needed or ordered today. We also reviewed her medications today. she has been encouraged to call the office with any questions or concerns that should arise related to todays visit.  Return if symptoms worsen or fail to improve, for keep scheduled visit in november.   No orders of the defined types were placed in this encounter.   Meds ordered this encounter  Medications   mupirocin ointment (BACTROBAN) 2 %    Sig: Apply 1 Application topically 2 (two) times daily.    Dispense:  30 g    Refill:  0   hydrOXYzine (ATARAX) 10 MG tablet    Sig: Take 1 tablet (10 mg total) by mouth 3 (three) times daily as needed for itching.    Dispense:  90 tablet    Refill:  0    Time spent:10 Minutes Time spent with patient included reviewing progress notes, labs, imaging studies, and discussing plan for follow up.  [place] Controlled Substance Database was reviewed by me for overdose risk score (ORS) if appropriate.  This patient was seen by Sallyanne Kuster, FNP-C in collaboration with Dr. Beverely Risen as a part of collaborative care agreement.  Stasia Somero R. Tedd Sias, MSN, FNP-C Internal medicine

## 2023-05-22 ENCOUNTER — Other Ambulatory Visit: Payer: Self-pay | Admitting: Cardiovascular Disease

## 2023-05-26 ENCOUNTER — Ambulatory Visit (INDEPENDENT_AMBULATORY_CARE_PROVIDER_SITE_OTHER): Payer: PPO | Admitting: Nurse Practitioner

## 2023-05-26 ENCOUNTER — Encounter: Payer: Self-pay | Admitting: Nurse Practitioner

## 2023-05-26 VITALS — BP 148/70 | HR 80 | Temp 98.4°F | Resp 16 | Ht 66.5 in | Wt 160.6 lb

## 2023-05-26 DIAGNOSIS — I700 Atherosclerosis of aorta: Secondary | ICD-10-CM | POA: Diagnosis not present

## 2023-05-26 DIAGNOSIS — E039 Hypothyroidism, unspecified: Secondary | ICD-10-CM | POA: Diagnosis not present

## 2023-05-26 DIAGNOSIS — I10 Essential (primary) hypertension: Secondary | ICD-10-CM | POA: Diagnosis not present

## 2023-05-26 DIAGNOSIS — Z0000 Encounter for general adult medical examination without abnormal findings: Secondary | ICD-10-CM | POA: Diagnosis not present

## 2023-05-26 MED ORDER — LEVOTHYROXINE SODIUM 112 MCG PO TABS: 112.0000 ug | ORAL_TABLET | Freq: Every morning | ORAL | 1 refills | Status: DC

## 2023-05-26 MED ORDER — SYNTHROID 112 MCG PO TABS: 112.0000 ug | ORAL_TABLET | Freq: Every morning | ORAL | 1 refills | Status: DC

## 2023-05-26 NOTE — Progress Notes (Signed)
[facility] [address] [place], [place] [address]  Internal MEDICINE  Office Visit Note  Patient Name: Toni [name]  [id number]  [id number]  Date of Service: 05/26/2023  Chief Complaint  Patient presents with   Diabetes   Gastroesophageal Reflux   Hyperlipidemia   Hypertension   Medicare Wellness    HPI Toni [name] presents for a medicare annual wellness visit.  Well-appearing 77 y.o. female with hypertension, high cholesterol, diabetes and hyperthyroidism.  Routine CRC screening: due in 2028 Routine mammogram: done in July 2024 at [facility] Mammography DEXA scan: done in 2020.  Labs: getting labs done  New or worsening pain:none  Other concerns: none   Cardiology -- sees Dr. Kirke Corin  Dermatology-- sees [facility]      05/26/2023    2:03 PM 05/20/2022    3:10 PM 05/16/2021   11:01 AM  MMSE - Mini Mental State Exam  Orientation to time 5 5 5   Orientation to Place 5 5 5   Registration 3 3 3   Attention/ Calculation 5 5 5   Recall 3 3 3   Language- name 2 objects 2 2 2   Language- repeat 1 1 1   Language- follow 3 step command 3 3 3   Language- read & follow direction 1 1 1   Write a sentence 1 1 1   Copy design 1 1 1   Total score 30 30 30     Functional Status Survey: Is the patient deaf or have difficulty hearing?: No Does the patient have difficulty seeing, even when wearing glasses/contacts?: No Does the patient have difficulty concentrating, remembering, or making decisions?: Yes Does the patient have difficulty walking or climbing stairs?: No Does the patient have difficulty dressing or bathing?: No Does the patient have difficulty doing errands alone such as visiting a doctor's office or shopping?: No     12/08/2021    8:43 AM 03/10/2022    8:45 AM 05/20/2022    3:07 PM 10/05/2022    8:45 AM 05/26/2023    2:02 PM  Fall Risk  Falls in the past year? 1 0 0 0 0  Was there an injury with Fall? 0  0 0 0  Fall Risk Category Calculator 1  0 0 0  Fall Risk Category  (Retired) Low  Low    (RETIRED) Patient Fall Risk Level Low fall risk  Low fall risk    Patient at Risk for Falls Due to    No Fall Risks No Fall Risks  Fall risk Follow up Falls evaluation completed  Falls evaluation completed Falls evaluation completed Falls evaluation completed       05/26/2023    2:02 PM  Depression screen PHQ 2/9  Decreased Interest 0  Down, Depressed, Hopeless 0  PHQ - 2 Score 0        Current Medication: Outpatient Encounter Medications as of 05/26/2023  Medication Sig   aspirin 81 MG tablet Take 81 mg by mouth daily.   Bempedoic Acid-Ezetimibe (NEXLIZET) 180-10 MG TABS Take 1 tablet by mouth daily.   Calcium Carbonate-Vitamin D (CALCIUM + D PO) Take 1 tablet by mouth daily.    cetirizine (ZYRTEC) 10 MG tablet Take 10 mg by mouth as needed.    chlorthalidone (HYGROTON) 25 MG tablet TAKE ONE TABLET BY MOUTH ONCE A DAY   cholecalciferol (VITAMIN D) 1000 units tablet Take 1,000 Units by mouth daily.   hydrOXYzine (ATARAX) 10 MG tablet Take 1 tablet (10 mg total) by mouth 3 (three) times daily as needed for itching.  icosapent Ethyl (VASCEPA) 1 g capsule Take 2 capsules (2 g total) by mouth 2 (two) times daily.   mupirocin ointment (BACTROBAN) 2 % Apply 1 Application topically 2 (two) times daily.   pantoprazole (PROTONIX) 40 MG tablet TAKE ONE TABLET BY MOUTH ONCE A DAY   spironolactone (ALDACTONE) 25 MG tablet Take 1 tablet (25 mg) by mouth twice daily   [DISCONTINUED] SYNTHROID 112 MCG tablet TAKE ONE TABLET BY MOUTH EVERY MORNING   losartan (COZAAR) 25 MG tablet Take 1 tablet (25 mg total) by mouth daily.   SYNTHROID 112 MCG tablet Take 1 tablet (112 mcg total) by mouth every morning.   [DISCONTINUED] levothyroxine (SYNTHROID) 112 MCG tablet Take 1 tablet (112 mcg total) by mouth every morning.   No facility-administered encounter medications on file as of 05/26/2023.    Surgical History: Past Surgical History:  Procedure Laterality Date    APPENDECTOMY     BUNIONECTOMY     right foot   CARDIAC CATHETERIZATION  1994   [facility]    CATARACT EXTRACTION, BILATERAL Left 10/20/2017   CATARACT EXTRACTION, BILATERAL Right 10/27/2017   CHOLECYSTECTOMY     COLONOSCOPY  05/25/2016   Dr.Stark   COLONOSCOPY     March 2023   HAMMER TOE SURGERY  12/2012   left toe   PARTIAL HYSTERECTOMY     POLYPECTOMY     ROBOTIC ASSISTED SALPINGO OOPHERECTOMY     right ovary first removed then left ovary removed years later   TONSILLECTOMY  age 41   TOTAL THYROIDECTOMY  09/2011    Medical History: Past Medical History:  Diagnosis Date   Allergic rhinitis    Allergy    Aortic atherosclerosis (HCC)    Diabetes mellitus, type 2 (HCC)    no medicines- was pre diabtes- diet controlled    Diverticulitis    Essential hypertension    GERD (gastroesophageal reflux disease)    Hyperlipidemia    Hypertension    Hypothyroidism    Obesity    Osteoporosis    Sleep apnea    mild -no cpap   Transient insomnia    Tubular adenoma of colon 05/2013    Family History: Family History  Problem Relation Age of Onset   Throat cancer Father    Esophageal cancer Father    Colon cancer Maternal Aunt    Colon polyps Maternal Aunt    Colon cancer Maternal Uncle    Colon polyps Maternal Uncle    Heart disease Maternal Uncle    Stomach cancer Maternal Uncle    Prostate cancer Maternal Uncle    Breast cancer Cousin    Diabetes Neg Hx    Kidney disease Neg Hx    Gallbladder disease Neg Hx    Rectal cancer Neg Hx     Social History   Socioeconomic History   Marital status: Married    Spouse name: Not on file   Number of children: 2   Years of education: Not on file   Highest education level: Not on file  Occupational History   Occupation: Retired  Tobacco Use   Smoking status: Former    Current packs/day: 0.00    Average packs/day: 1 pack/day for 30.0 years (30.0 ttl pk-yrs)    Types: Cigarettes    Start date: 07/06/1968    Quit date:  07/06/1998    Years since quitting: 24.9   Smokeless tobacco: Never  Vaping Use   Vaping status: Never Used  Substance and Sexual Activity  Alcohol use: No    Alcohol/week: 0.0 standard drinks of alcohol   Drug use: No   Sexual activity: Not on file  Other Topics Concern   Not on file  Social History Narrative   Not on file   Social Determinants of Health   Financial Resource Strain: Low Risk  (12/24/2020)   Overall Financial Resource Strain (CARDIA)    Difficulty of Paying Living Expenses: Not hard at all  Food Insecurity: Not on file  Transportation Needs: Not on file  Physical Activity: Not on file  Stress: Not on file  Social Connections: Not on file  Intimate Partner Violence: Not on file      Review of Systems  Constitutional:  Negative for activity change, appetite change, chills, fatigue, fever and unexpected weight change.  HENT: Negative.  Negative for congestion, ear pain, rhinorrhea, sore throat and trouble swallowing.   Eyes: Negative.   Respiratory: Negative.  Negative for cough, chest tightness, shortness of breath and wheezing.   Cardiovascular: Negative.  Negative for chest pain.  Gastrointestinal: Negative.  Negative for abdominal pain, blood in stool, constipation, diarrhea, nausea and vomiting.  Endocrine: Negative.   Genitourinary: Negative.  Negative for difficulty urinating, dysuria, frequency, hematuria and urgency.  Musculoskeletal:  Positive for arthralgias. Negative for back pain, joint swelling, myalgias and neck pain.  Skin: Negative.  Negative for rash and wound.  Allergic/Immunologic: Negative.  Negative for immunocompromised state.  Neurological: Negative.  Negative for dizziness, seizures, numbness and headaches.  Hematological: Negative.   Psychiatric/Behavioral: Negative.  Negative for behavioral problems, self-injury and suicidal ideas. The patient is not nervous/anxious.     Vital Signs: BP (!) 148/70   Pulse 80   Temp 98.4 F (36.9  C)   Resp 16   Ht 5' 6.5" (1.689 m)   Wt 160 lb 9.6 oz (72.8 kg)   SpO2 98%   BMI 25.53 kg/m    Physical Exam Vitals reviewed.  Constitutional:      General: She is awake. She is not in acute distress.    Appearance: Normal appearance. She is well-developed, well-groomed and normal weight. She is not ill-appearing or diaphoretic.  HENT:     Head: Normocephalic and atraumatic.     Mouth/Throat:     Lips: Pink.     Pharynx: Uvula midline.  Eyes:     General: Lids are normal. Vision grossly intact. Gaze aligned appropriately.     Extraocular Movements: Extraocular movements intact.     Conjunctiva/sclera: Conjunctivae normal.     Pupils: Pupils are equal, round, and reactive to light.     Funduscopic exam:    Right eye: Red reflex present.        Left eye: Red reflex present. Neck:     Thyroid: No thyromegaly.     Vascular: No JVD.     Trachea: Trachea and phonation normal. No tracheal deviation.  Cardiovascular:     Rate and Rhythm: Normal rate and regular rhythm.     Pulses: Normal pulses.     Heart sounds: Normal heart sounds, S1 normal and S2 normal. No murmur heard.    No friction rub. No gallop.  Pulmonary:     Effort: Pulmonary effort is normal. No accessory muscle usage or respiratory distress.     Breath sounds: Normal breath sounds and air entry. No stridor. No wheezing or rales.  Chest:     Chest wall: No tenderness.     Comments: Declines clinical breast exam, gets annual mammograms.  Abdominal:     Palpations: There is no shifting dullness, fluid wave or pulsatile mass.  Skin:    Capillary Refill: Capillary refill takes less than 2 seconds.  Neurological:     Mental Status: She is alert and oriented to person, place, and time.     Cranial Nerves: No cranial nerve deficit.     Motor: No abnormal muscle tone.     Coordination: Coordination normal.     Deep Tendon Reflexes: Reflexes are normal and symmetric.  Psychiatric:        Mood and Affect: Mood  normal.        Behavior: Behavior normal. Behavior is cooperative.        Assessment/Plan: 1. Encounter for subsequent annual wellness visit (AWV) in Medicare patient (Primary) Age-appropriate preventive screenings and vaccinations discussed, annual physical exam completed. Routine labs for health maintenance will be drawn soon. PHM updated.    2. Acquired hypothyroidism Continue synthroid as prescribed.  - SYNTHROID 112 MCG tablet; Take 1 tablet (112 mcg total) by mouth every morning.  Dispense: 90 tablet; Refill: 1  3. Essential hypertension Continue losartan as prescribed, continue to follow up with cardiology.   4. Aortic atherosclerosis (HCC) Continue nexlizet and vascepa as prescribed by cardiology. Discussing starting repatha via cardiology.       General Counseling: Toni [name] understanding of the findings of todays visit and agrees with plan of treatment. I have discussed any further diagnostic evaluation that may be needed or ordered today. We also reviewed her medications today. she has been encouraged to call the office with any questions or concerns that should arise related to todays visit.    No orders of the defined types were placed in this encounter.   Meds ordered this encounter  Medications   DISCONTD: levothyroxine (SYNTHROID) 112 MCG tablet    Sig: Take 1 tablet (112 mcg total) by mouth every morning.    Dispense:  90 tablet    Refill:  1   SYNTHROID 112 MCG tablet    Sig: Take 1 tablet (112 mcg total) by mouth every morning.    Dispense:  90 tablet    Refill:  1    Discontinue levothyroxine ordered, must fill as synthroid thank you.    Return in about 6 months (around 11/23/2023) for F/U, Toni [name] PCP.   Total time spent:30 Minutes Time spent includes review of chart, medications, test results, and follow up plan with the patient.   [place] Controlled Substance Database was reviewed by me.  This patient was seen by Sallyanne Kuster, FNP-C in  collaboration with Dr. Beverely Risen as a part of collaborative care agreement.  Iokepa Geffre R. Tedd Sias, MSN, FNP-C Internal medicine

## 2023-05-27 ENCOUNTER — Telehealth: Payer: Self-pay | Admitting: Cardiovascular Disease

## 2023-05-27 ENCOUNTER — Other Ambulatory Visit: Payer: Self-pay

## 2023-05-27 DIAGNOSIS — Z79899 Other long term (current) drug therapy: Secondary | ICD-10-CM

## 2023-05-27 NOTE — Telephone Encounter (Signed)
Left detailed message per DPR that the orders for [facility] were entered into the charting system and have been released so she should be able to get them drawn at her earliest convenience

## 2023-05-27 NOTE — Telephone Encounter (Signed)
Pt states she went to [facility] this morning to get her labs done and they stated they did not have an order.

## 2023-05-28 LAB — LIPID PANEL
Chol/HDL Ratio: 5.6 ratio — ABNORMAL HIGH (ref 0.0–4.4)
Cholesterol, Total: 234 mg/dL — ABNORMAL HIGH (ref 100–199)
HDL: 42 mg/dL (ref >39)
LDL Chol Calc (NIH): 156 mg/dL — ABNORMAL HIGH (ref 0–99)
Triglycerides: 198 mg/dL — ABNORMAL HIGH (ref 0–149)
VLDL Cholesterol Cal: 36 mg/dL (ref 5–40)

## 2023-05-28 LAB — HEPATIC FUNCTION PANEL
ALT: 26 [IU]/L (ref 0–32)
AST: 26 [IU]/L (ref 0–40)
Albumin: 4.5 g/dL (ref 3.8–4.8)
Alkaline Phosphatase: 44 [IU]/L (ref 44–121)
Bilirubin Total: 0.8 mg/dL (ref 0.0–1.2)
Bilirubin, Direct: 0.23 mg/dL (ref 0.00–0.40)
Total Protein: 6.8 g/dL (ref 6.0–8.5)

## 2023-06-08 ENCOUNTER — Ambulatory Visit: Payer: PPO | Admitting: Physician Assistant

## 2023-06-09 NOTE — Progress Notes (Unsigned)
Cardiology Office Note    Date:  06/10/2023   ID:  Keerthana, Cleaves 1946-04-18, MRN [id number]  PCP:  Sallyanne Kuster, NP  Cardiologist:  Lorine Bears, MD  Electrophysiologist:  None   Chief Complaint: Follow up  History of Present Illness:   AFRIKA CHOP is a 77 y.o. female with history of coronary artery calcification, aortic atherosclerosis, DM2, HTN, HLD, hypothyroidism, obesity, and mild sleep apnea that does not require CPAP usage who presents for follow-up of coronary artery location and HTN.   She was previously evaluated by Dr. Kirke Corin in 2016 for atypical chest pain.  CT of the abdomen showed aortic atherosclerosis.  Carotid Doppler showed mild intimal thickening with calcified plaque bilaterally.  ABI was normal bilaterally.  Echo showed normal LV systolic function with no significant valvular abnormalities.  Lexiscan MPI showed no evidence of ischemia with a normal EF and was overall low risk.  She was referred back to Dr. Kirke Corin in 09/2021 for evaluation of hypertension with intolerance to multiple medications including ACE inhibitor and ARB, due to dry cough.  It was noted she had previously been on amlodipine and diltiazem, though these did not adequately control her blood pressure.  She had previously been on beta-blocker for migraines, though this led to bradycardia.  She was subsequently placed on nifedipine, though noted increased lower extremity swelling involving the left leg with this.  She had been on HCTZ for quite a while and was tolerating this medication.  She reported her blood pressure became uncontrolled over the preceding year.  There was some mild exertional dyspnea and fatigue.  She was without chest pain.  Blood pressure in the office was elevated at 152/80.  Elevated BP was felt to likely be essential in etiology.  Given lower extremity swelling, nifedipine was stopped, HCTZ was transitioned to chlorthalidone, and carvedilol was added.  Given fatigue and  dyspnea, as well as murmur suggestive of aortic sclerosis on exam, she underwent echo on 09/30/2021 which demonstrated an EF of 60 to 65%, no regional wall motion abnormalities, grade 1 diastolic dysfunction, normal RV systolic function and ventricular cavity size, mild mitral regurgitation, and an estimated right atrial pressure of 3 mmHg.  She was seen in follow up on 10/07/2021 and was doing well.  Since initiating carvedilol and transitioning HCTZ to chlorthalidone, she continued to note elevated BP readings typically in the 150s to 170s systolic.  However, since undergoing these medication changes she had only had one systolic blood pressure reading greater than 200 mmHg.  She did note some increase in fatigue following the initiation of carvedilol.  Following the discontinuation of nifedipine she noted resolution of lower extremity swelling.  Coreg was titrated to 12.5 mg bid and she was continued on chlorthalidone 25 mg.  Renal artery ultrasound showed no evidence of RAS bilaterally.  Rennin aldosterone and plasma metanephrines were normal.     She was seen in the office in 11/2021 and noted improvement in BP readings at home with most readings in the 140s systolic.  She was adjusting to some fatigue associated with carvedilol.  She was seen in the office on 05/14/2022 and continued to note fatigue following the initiation and subsequent titration of carvedilol.  Blood pressure readings at home were in the 160s to 180s systolic.  She preferred to discontinue carvedilol.  Despite discontinuation of ACE inhibitor/ARB she continued to note a cough.  Blood pressure in the office was 140/80.  Carvedilol was discontinued and she was  initiated on spironolactone 25 mg daily.   She was seen in the office on 06/08/2022 and noted an improvement in her fatigue following discontinuation of carvedilol and felt like her energy was back to baseline.  Cough persisted despite discontinuation of ACE inhibitor and ARB and has been  present since her COVID illness.  She also reported a recent infection with RSV which she felt like was contributing to her cough.  Blood pressure in the office was 166/82, spironolactone was titrated to 25 mg twice daily.    She was seen in the office in 08/2022 and underwent a rechallenge of losartan 25 mg daily with continuation of spironolactone 25 mg twice daily and chlorthalidone 25 mg daily.     She was seen in the office in 10/2022 and losartan was titrated to 25 mg twice daily with continuation of chlorthalidone 25 mg daily and spironolactone 25 mg twice daily.  She underwent CT of the chest for chronic cough which demonstrated multivessel coronary artery calcification, aortic atherosclerosis, and mild bronchial wall thickening.  In this setting, she was evaluated in 11/2022 and reported a slight increase in exertional dyspnea with activity such as mowing the lawn.  Following titration of losartan, she had some positional dizziness leading her to decrease back to 25 mg daily.  Lexiscan MPI 12/04/2022 showed no evidence of ischemia or infarction with an EF of 71%.  CT attenuated weighted images that showed mild aortic atherosclerosis.  Abnormal EKG at baseline with deeply inverted T waves in the inferior leads.  Overall, this was a low risk study.  She is undergoing evaluation by pulmonology for chronic cough.  She was last seen in the office in 03/2003 and was without symptoms of angina or cardiac decompensation.  Chronic cough was a little improved.  Was tolerating Nexlizet.  Started on Vascepa.  She comes in doing well from a cardiac perspective and is without symptoms of angina or cardiac decompensation.  No dizziness, presyncope, or syncope.  Blood pressure is well-controlled.  She has tolerated the addition of Vascepa and continues to tolerate Nexlizet, though does notice some increased gas production.  She also notes some randomly occurring intermittent bilateral foot cramping that occurs  approximately several times per month.  No lower extremity swelling or progressive orthopnea.   Labs independently reviewed: 05/2023 - TC 234, TG 198, HDL 42, LDL 156, albumin 4.5, AST/ALT normal 03/2023 - BUN 23, serum creatinine 1.07, potassium 4.5, Hgb 14.5, PLT 235 02/2023 - A1c 6.4 06/2022 - TSH 0.110, free T4 normal  Past Medical History:  Diagnosis Date   Allergic rhinitis    Allergy    Aortic atherosclerosis (HCC)    Diabetes mellitus, type 2 (HCC)    no medicines- was pre diabtes- diet controlled    Diverticulitis    Essential hypertension    GERD (gastroesophageal reflux disease)    Hyperlipidemia    Hypertension    Hypothyroidism    Obesity    Osteoporosis    Sleep apnea    mild -no cpap   Transient insomnia    Tubular adenoma of colon 05/2013    Past Surgical History:  Procedure Laterality Date   APPENDECTOMY     BUNIONECTOMY     right foot   CARDIAC CATHETERIZATION  1994   [facility]    CATARACT EXTRACTION, BILATERAL Left 10/20/2017   CATARACT EXTRACTION, BILATERAL Right 10/27/2017   CHOLECYSTECTOMY     COLONOSCOPY  05/25/2016   Dr.Stark   COLONOSCOPY  March 2023   HAMMER TOE SURGERY  12/2012   left toe   PARTIAL HYSTERECTOMY     POLYPECTOMY     ROBOTIC ASSISTED SALPINGO OOPHERECTOMY     right ovary first removed then left ovary removed years later   TONSILLECTOMY  age 44   TOTAL THYROIDECTOMY  09/2011    Current Medications: Current Meds  Medication Sig   aspirin 81 MG tablet Take 81 mg by mouth daily.   Bempedoic Acid-Ezetimibe (NEXLIZET) 180-10 MG TABS Take 1 tablet by mouth daily.   Calcium Carbonate-Vitamin D (CALCIUM + D PO) Take 1 tablet by mouth daily.    cetirizine (ZYRTEC) 10 MG tablet Take 10 mg by mouth as needed.    chlorthalidone (HYGROTON) 25 MG tablet TAKE ONE TABLET BY MOUTH ONCE A DAY   cholecalciferol (VITAMIN D) 1000 units tablet Take 1,000 Units by mouth daily.   icosapent Ethyl (VASCEPA) 1 g capsule Take 2 capsules  (2 g total) by mouth 2 (two) times daily.   losartan (COZAAR) 25 MG tablet Take 1 tablet (25 mg total) by mouth daily.   pantoprazole (PROTONIX) 40 MG tablet TAKE ONE TABLET BY MOUTH ONCE A DAY   spironolactone (ALDACTONE) 25 MG tablet Take 1 tablet (25 mg) by mouth twice daily   SYNTHROID 112 MCG tablet Take 1 tablet (112 mcg total) by mouth every morning.    Allergies:   Cefaclor, Penicillins, and Talwin [pentazocine]   Social History   Socioeconomic History   Marital status: Married    Spouse name: Not on file   Number of children: 2   Years of education: Not on file   Highest education level: Not on file  Occupational History   Occupation: Retired  Tobacco Use   Smoking status: Former    Current packs/day: 0.00    Average packs/day: 1 pack/day for 30.0 years (30.0 ttl pk-yrs)    Types: Cigarettes    Start date: 07/06/1968    Quit date: 07/06/1998    Years since quitting: 24.9   Smokeless tobacco: Never  Vaping Use   Vaping status: Never Used  Substance and Sexual Activity   Alcohol use: No    Alcohol/week: 0.0 standard drinks of alcohol   Drug use: No   Sexual activity: Not on file  Other Topics Concern   Not on file  Social History Narrative   Not on file   Social Determinants of Health   Financial Resource Strain: Low Risk  (12/24/2020)   Overall Financial Resource Strain (CARDIA)    Difficulty of Paying Living Expenses: Not hard at all  Food Insecurity: Not on file  Transportation Needs: Not on file  Physical Activity: Not on file  Stress: Not on file  Social Connections: Not on file     Family History:  The patient's family history includes Breast cancer in her cousin; Colon cancer in her maternal aunt and maternal uncle; Colon polyps in her maternal aunt and maternal uncle; Esophageal cancer in her father; Heart disease in her maternal uncle; Prostate cancer in her maternal uncle; Stomach cancer in her maternal uncle; Throat cancer in her father. There is no  history of Diabetes, Kidney disease, Gallbladder disease, or Rectal cancer.  ROS:   12-point review of systems is negative unless otherwise noted in the HPI.   EKGs/Labs/Other Studies Reviewed:    Studies reviewed were summarized above. The additional studies were reviewed today:  Lexiscan MPI 12/04/2022:   The study is normal. The study is low  risk.   No ST deviation was noted.   LV perfusion is normal. There is no evidence of ischemia. There is no evidence of infarction.   Left ventricular function is normal. Nuclear stress EF: 71 %. The left ventricular ejection fraction is hyperdynamic (>65%). End diastolic cavity size is normal. End systolic cavity size is normal.   CT attenuation images shows mild aortic calcifications.   Abnormal baseline EKG with deeply inverted T waves in the inferior leads. __________   Renal artery ultrasound 10/10/2021: Renal:    Right: Normal size right kidney. Normal right Resisitive Index.         Normal cortical thickness of right kidney. RRV flow present.         No evidence of right renal artery stenosis.  Left:  LRV flow present. No evidence of left renal artery stenosis.         Normal size of left kidney. Normal left Resistive Index.         Normal cortical thickness of the left kidney.  Mesenteric:  Normal Celiac artery and Superior Mesenteric artery findings.  __________   2D echo 09/30/2021: 1. Left ventricular ejection fraction, by estimation, is 60 to 65%. The  left ventricle has normal function. The left ventricle has no regional  wall motion abnormalities. Left ventricular diastolic parameters are  consistent with Grade I diastolic  dysfunction (impaired relaxation).   2. Right ventricular systolic function is normal. The right ventricular  size is normal.   3. The mitral valve is normal in structure. Mild mitral valve  regurgitation. No evidence of mitral stenosis.   4. The aortic valve is tricuspid. Aortic valve regurgitation is not   visualized. No aortic stenosis is present.   5. The inferior vena cava is normal in size with greater than 50%  respiratory variability, suggesting right atrial pressure of 3 mmHg. __________   Eugenie Birks MPI 12/19/2014: Pharmacological myocardial perfusison imaging study with no significnat ischemia. The left ventricular ejection fraction is hyperdynamic (>65%). No wall motion abnormality noted. No EKG changes concerning for ischemia. This is a low risk study.   EKG:  EKG is ordered today.  The EKG ordered today demonstrates NSR, 75 bpm, no acute ST-T changes  Recent Labs: 06/22/2022: TSH 0.110 03/22/2023: BUN 23; Creatinine, Ser 1.07; Hemoglobin 14.5; Platelets 235; Potassium 4.5; Sodium 142 05/27/2023: ALT 26  Recent Lipid Panel    Component Value Date/Time   CHOL 234 (H) 05/27/2023 0846   TRIG 198 (H) 05/27/2023 0846   HDL 42 05/27/2023 0846   CHOLHDL 5.6 (H) 05/27/2023 0846   LDLCALC 156 (H) 05/27/2023 0846    PHYSICAL EXAM:    VS:  BP 138/78 (BP Location: Left Arm, Patient Position: Sitting, Cuff Size: Normal)   Pulse 75   Ht 5' 6.5" (1.689 m)   Wt 158 lb 3.2 oz (71.8 kg)   SpO2 98%   BMI 25.15 kg/m   BMI: Body mass index is 25.15 kg/m.  Physical Exam Vitals reviewed.  Constitutional:      Appearance: She is well-developed.  HENT:     Head: Normocephalic and atraumatic.  Eyes:     General:        Right eye: No discharge.        Left eye: No discharge.  Neck:     Vascular: No JVD.  Cardiovascular:     Rate and Rhythm: Normal rate and regular rhythm.     Heart sounds: Normal heart sounds, S1 normal and S2  normal. Heart sounds not distant. No midsystolic click and no opening snap. No murmur heard.    No friction rub.  Pulmonary:     Effort: Pulmonary effort is normal. No respiratory distress.     Breath sounds: Normal breath sounds. No decreased breath sounds, wheezing or rales.  Chest:     Chest wall: No tenderness.  Abdominal:     General: There is no  distension.  Musculoskeletal:     Cervical back: Normal range of motion.     Right lower leg: No edema.     Left lower leg: No edema.  Skin:    General: Skin is warm and dry.     Nails: There is no clubbing.  Neurological:     Mental Status: She is alert and oriented to person, place, and time.  Psychiatric:        Speech: Speech normal.        Behavior: Behavior normal.        Thought Content: Thought content normal.        Judgment: Judgment normal.     Wt Readings from Last 3 Encounters:  06/10/23 158 lb 3.2 oz (71.8 kg)  05/26/23 160 lb 9.6 oz (72.8 kg)  04/02/23 155 lb (70.3 kg)     ASSESSMENT & PLAN:   Coronary artery calcification/aortic atherosclerosis/HLD and hypertriglyceridemia with statin intolerance: She is without symptoms of angina or cardiac decompensation.  Recent Lexiscan MPI showed no evidence of high risk ischemia and was overall low risk.  She is intolerant to statins secondary to abnormal liver function testing.  PCSK9 inhibitors have been felt to be cost prohibitive.  Tolerating at [employer], though LDL triglycerides remain above goal.  She is considering transitioning to a different pharmacy and will contact [facility] for prices of Nexlizet, Vascepa, and PCSK9 inhibitor.  If not cost prohibitive, may need to consider transitioning to PCSK9 inhibitor.  Heart healthy diet encouraged.  HTN: Blood pressure is well-controlled in the office this morning.  Continue HCTZ 25 mg, losartan 25 mg, and spironolactone 25 mg.  Recent labs stable.  Low-sodium diet recommended.  Chronic cough: Longstanding issue and is persistent despite medication holidays of ACE inhibitor's and ARB's as well as with PPI for GERD.  Ongoing management per pulmonology.  This was not discussed at today's visit.    Disposition: F/u with Dr. Kirke Corin or an APP in 6 months.   Medication Adjustments/Labs and Tests Ordered: Current medicines are reviewed at length  with the patient today.  Concerns regarding medicines are outlined above. Medication changes, Labs and Tests ordered today are summarized above and listed in the Patient Instructions accessible in Encounters.   Signed, Eula Listen, PA-C 06/10/2023 9:47 AM     [facility] [place] [address] [place], [place] [address] [phone]

## 2023-06-10 ENCOUNTER — Encounter: Payer: Self-pay | Admitting: Physician Assistant

## 2023-06-10 ENCOUNTER — Ambulatory Visit: Payer: PPO | Attending: Physician Assistant | Admitting: Physician Assistant

## 2023-06-10 ENCOUNTER — Telehealth: Payer: Self-pay

## 2023-06-10 VITALS — BP 138/78 | HR 75 | Ht 66.5 in | Wt 158.2 lb

## 2023-06-10 DIAGNOSIS — I10 Essential (primary) hypertension: Secondary | ICD-10-CM | POA: Diagnosis not present

## 2023-06-10 DIAGNOSIS — E781 Pure hyperglyceridemia: Secondary | ICD-10-CM

## 2023-06-10 DIAGNOSIS — I700 Atherosclerosis of aorta: Secondary | ICD-10-CM | POA: Diagnosis not present

## 2023-06-10 DIAGNOSIS — I2510 Atherosclerotic heart disease of native coronary artery without angina pectoris: Secondary | ICD-10-CM | POA: Diagnosis not present

## 2023-06-10 DIAGNOSIS — E785 Hyperlipidemia, unspecified: Secondary | ICD-10-CM | POA: Diagnosis not present

## 2023-06-10 DIAGNOSIS — R053 Chronic cough: Secondary | ICD-10-CM

## 2023-06-10 DIAGNOSIS — Z789 Other specified health status: Secondary | ICD-10-CM | POA: Diagnosis not present

## 2023-06-10 NOTE — Patient Instructions (Addendum)
Medication Instructions:  Your Physician recommend you continue on your current medication as directed.    *If you need a refill on your cardiac medications before your next appointment, please call your pharmacy*   Lab Work: None ordered at this time    Follow-Up: At [facility], you and your health needs are our priority.  As part of our continuing mission to provide you with exceptional heart care, we have created designated Provider Care Teams.  These Care Teams include your primary Cardiologist (physician) and Advanced Practice Providers (APPs -  Physician Assistants and Nurse Practitioners) who all work together to provide you with the care you need, when you need it.  We recommend signing up for the patient portal called "MyChart".  Sign up information is provided on this After Visit Summary.  MyChart is used to connect with patients for Virtual Visits (Telemedicine).  Patients are able to view lab/test results, encounter notes, upcoming appointments, etc.  Non-urgent messages can be sent to your provider as well.   To learn more about what you can do with MyChart, go to [email or web address].    Your next appointment:   6 month(s)  Provider:   You may see Lorine Bears, MD or one of the following Advanced Practice Providers on your designated Care Team:   Eula Listen, PA-C  Other Instructions Call [facility] to check on the price of Repatha versus Praluent

## 2023-06-10 NOTE — Patient Outreach (Signed)
  Care Coordination   06/10/2023 Name: Toni [name] MRN: [id number] DOB: 01-15-1946   Care Coordination Outreach Attempts:  An unsuccessful telephone outreach was attempted today to offer the patient information about available care coordination services. HIPAA compliant message left.   Follow Up Plan:  Additional outreach attempts will be made to offer the patient care coordination information and services.   Encounter Outcome:  No Answer   Care Coordination Interventions:  No, not indicated    George Ina RN,BSN,CCM [facility]  [facility], [facility] coordinator / Case Manager Phone: [phone]

## 2023-06-16 ENCOUNTER — Telehealth: Payer: Self-pay

## 2023-06-16 NOTE — Telephone Encounter (Signed)
Left message for patient regarding [facility] Medication Adherence report. I will need to speak with them if they call back.

## 2023-06-21 ENCOUNTER — Other Ambulatory Visit: Payer: Self-pay

## 2023-06-22 ENCOUNTER — Telehealth: Payer: Self-pay | Admitting: Cardiovascular Disease

## 2023-06-22 ENCOUNTER — Other Ambulatory Visit: Payer: Self-pay

## 2023-06-22 ENCOUNTER — Other Ambulatory Visit (HOSPITAL_COMMUNITY): Payer: Self-pay

## 2023-06-22 ENCOUNTER — Telehealth: Payer: Self-pay | Admitting: Pharmacy Technician

## 2023-06-22 MED ORDER — REPATHA SURECLICK 140 MG/ML ~~LOC~~ SOAJ
140.0000 mg | SUBCUTANEOUS | 11 refills | Status: DC
  Filled 2023-06-22 – 2023-06-28 (×2): qty 2, 28d supply, fill #0
  Filled 2023-07-20: qty 2, 28d supply, fill #1
  Filled 2023-08-19: qty 2, 28d supply, fill #2
  Filled 2023-09-17: qty 2, 28d supply, fill #3
  Filled 2023-10-19: qty 2, 28d supply, fill #4
  Filled 2023-11-15: qty 2, 28d supply, fill #5
  Filled 2023-12-17: qty 2, 28d supply, fill #6
  Filled 2024-01-11 (×2): qty 2, 28d supply, fill #7
  Filled 2024-02-07: qty 2, 28d supply, fill #8
  Filled 2024-03-07: qty 2, 28d supply, fill #9
  Filled 2024-04-04: qty 2, 28d supply, fill #10
  Filled 2024-05-03: qty 2, 28d supply, fill #11

## 2023-06-22 NOTE — Telephone Encounter (Signed)
Yes, okay to discontinue Nexlizet (when she starts Repatha) and start Repatha 140 mg every 2 weeks.

## 2023-06-22 NOTE — Telephone Encounter (Signed)
Pt c/o medication issue:  1. Name of Medication:   Repatha  2. How are you currently taking this medication (dosage and times per day)?   Not taking  3. Are you having a reaction (difficulty breathing--STAT)?   4. What is your medication issue?   Patient stated she if following-up per R. Dunn regarding getting this medication.  Patient stated the Repatha is a Tier 3 drug and will cost less than the Nexlizet but will need prior authorization.  Patient stated she has not been having any side-effects to the Nexlizet but it will cost her month.  Patient stated she will need a prescription for the Repatha and get prior approval.

## 2023-06-22 NOTE — Telephone Encounter (Signed)
Patient has been made aware.

## 2023-06-22 NOTE — Telephone Encounter (Signed)
Repatha has been sent into [facility] and message sent to the prior auth pool.

## 2023-06-22 NOTE — Telephone Encounter (Signed)
The patient stated that she would like to start Repatha but will need a PA first. She would like it sent to [facility] outpatient pharmacy due to her insurance.   She would like to discontinue the Nexlizet.

## 2023-06-22 NOTE — Telephone Encounter (Signed)
Pharmacy Patient Advocate Encounter   Received notification from Pt Calls Messages that prior authorization for repatha is required/requested.   Insurance verification completed.   The patient is insured through [facility] ADVANTAGE/RX ADVANCE .   Per test claim: PA required; PA submitted to above mentioned insurance via CoverMyMeds Key/confirmation #/EOC [id number] Status is pending

## 2023-06-22 NOTE — Telephone Encounter (Signed)
Pharmacy Patient Advocate Encounter  Received notification from [facility] ADVANTAGE/RX ADVANCE that Prior Authorization for repatha has been APPROVED from 06/22/23 to 12/19/23. Ran test claim, Copay is $47.00. This test claim was processed through [facility]- copay amounts may vary at other pharmacies due to pharmacy/plan contracts, or as the patient moves through the different stages of their insurance plan.   PA #/Case ID/Reference #: [id number]

## 2023-06-28 ENCOUNTER — Other Ambulatory Visit: Payer: Self-pay

## 2023-07-02 ENCOUNTER — Other Ambulatory Visit: Payer: Self-pay | Admitting: Physician Assistant

## 2023-07-20 ENCOUNTER — Other Ambulatory Visit: Payer: Self-pay

## 2023-08-06 DIAGNOSIS — Z961 Presence of intraocular lens: Secondary | ICD-10-CM | POA: Diagnosis not present

## 2023-08-06 DIAGNOSIS — H26492 Other secondary cataract, left eye: Secondary | ICD-10-CM | POA: Diagnosis not present

## 2023-08-06 DIAGNOSIS — H26491 Other secondary cataract, right eye: Secondary | ICD-10-CM | POA: Diagnosis not present

## 2023-08-08 ENCOUNTER — Encounter: Payer: Self-pay | Admitting: Nurse Practitioner

## 2023-08-09 DIAGNOSIS — D485 Neoplasm of uncertain behavior of skin: Secondary | ICD-10-CM | POA: Diagnosis not present

## 2023-08-09 DIAGNOSIS — D2262 Melanocytic nevi of left upper limb, including shoulder: Secondary | ICD-10-CM | POA: Diagnosis not present

## 2023-08-09 DIAGNOSIS — D225 Melanocytic nevi of trunk: Secondary | ICD-10-CM | POA: Diagnosis not present

## 2023-08-09 DIAGNOSIS — D2272 Melanocytic nevi of left lower limb, including hip: Secondary | ICD-10-CM | POA: Diagnosis not present

## 2023-08-09 DIAGNOSIS — D2261 Melanocytic nevi of right upper limb, including shoulder: Secondary | ICD-10-CM | POA: Diagnosis not present

## 2023-08-09 DIAGNOSIS — L820 Inflamed seborrheic keratosis: Secondary | ICD-10-CM | POA: Diagnosis not present

## 2023-08-09 DIAGNOSIS — L821 Other seborrheic keratosis: Secondary | ICD-10-CM | POA: Diagnosis not present

## 2023-08-09 DIAGNOSIS — Z85828 Personal history of other malignant neoplasm of skin: Secondary | ICD-10-CM | POA: Diagnosis not present

## 2023-08-09 DIAGNOSIS — D2271 Melanocytic nevi of right lower limb, including hip: Secondary | ICD-10-CM | POA: Diagnosis not present

## 2023-08-10 DIAGNOSIS — H26491 Other secondary cataract, right eye: Secondary | ICD-10-CM | POA: Diagnosis not present

## 2023-08-16 ENCOUNTER — Telehealth: Payer: Self-pay

## 2023-08-16 DIAGNOSIS — Z79899 Other long term (current) drug therapy: Secondary | ICD-10-CM

## 2023-08-16 NOTE — Progress Notes (Signed)
   08/16/2023 Name: Toni [name] MRN: [id number] DOB: Apr 11, 1946  Chief Complaint  Patient presents with   Hypertension    Medication Adherence Practice Report- HTA       They were referred to the pharmacist by a quality report for assistance in managing hypertension.    Subjective:  Care Team: Primary Care Provider: Laurence Pons, NP ; Next Scheduled Visit w/PCP: 11/22/2023   Medication Access/Adherence  Current Pharmacy:  [facility] - [place], [place] - [address] [place] [address] Phone: [phone] Fax: [phone]  [facility] [address] [place] [address] Phone: [phone] Fax: [phone]   Medication Management:  Current adherence strategy: Losartan  25 mg daily  Patient was on medication adherence report in December  Recent fill dates: 04/21/2023 for 90-days supply (per [facility] staff) and sold on 04/23/2023. She has two refills remaining, per pharmacy.  No fill hx in Dr. Anson Basta for this patient Per last cardiology appointment note from 06/10/2023, patient was supposed to continue medication. At this time, medication is overdue for a refill. I called and left patient a message asking to return my call.    Objective:  Lab Results  Component Value Date   HGBA1C 6.4 (A) 02/04/2023    Lab Results  Component Value Date   CREATININE 1.07 (H) 03/22/2023   BUN 23 03/22/2023   NA 142 03/22/2023   K 4.5 03/22/2023   CL 102 03/22/2023   CO2 25 03/22/2023    Lab Results  Component Value Date   CHOL 234 (H) 05/27/2023   HDL 42 05/27/2023   LDLCALC 156 (H) 05/27/2023   TRIG 198 (H) 05/27/2023   CHOLHDL 5.6 (H) 05/27/2023    Medications Reviewed Today   Medications were not reviewed in this encounter       Assessment/Plan:  Will sent a patient message for refill reminder and follow up in 2-4 business days.   Thank you for allowing  pharmacy to be a part of this patient's care. Alexandria Angel, PharmD Clinical Pharmacist Cell: [phone]

## 2023-08-19 NOTE — Progress Notes (Signed)
   08/19/2023 Name: JOSCELINE CHENARD MRN: [id number] DOB: 08-Aug-1945  Attempted to contact patient for medication management/review. Left HIPAA compliant message for patient to return my call at their convenience.   Second attempt for patient outreach. Will follow up with patient in 10 business days.  Thank you for allowing pharmacy to be a part of this patient's care.  Cephus Shelling, PharmD Clinical Pharmacist Cell: [phone]

## 2023-08-24 DIAGNOSIS — Z9889 Other specified postprocedural states: Secondary | ICD-10-CM | POA: Diagnosis not present

## 2023-08-25 ENCOUNTER — Other Ambulatory Visit: Payer: Self-pay | Admitting: Cardiovascular Disease

## 2023-10-19 ENCOUNTER — Other Ambulatory Visit: Payer: Self-pay

## 2023-11-01 ENCOUNTER — Telehealth: Payer: Self-pay

## 2023-11-01 ENCOUNTER — Other Ambulatory Visit: Payer: Self-pay | Admitting: Nurse Practitioner

## 2023-11-01 ENCOUNTER — Other Ambulatory Visit: Payer: Self-pay | Admitting: Physician Assistant

## 2023-11-01 ENCOUNTER — Other Ambulatory Visit: Payer: Self-pay | Admitting: Emergency Medicine

## 2023-11-01 DIAGNOSIS — I10 Essential (primary) hypertension: Secondary | ICD-10-CM

## 2023-11-01 DIAGNOSIS — Z79899 Other long term (current) drug therapy: Secondary | ICD-10-CM

## 2023-11-01 MED ORDER — LOSARTAN POTASSIUM 25 MG PO TABS
25.0000 mg | ORAL_TABLET | Freq: Every day | ORAL | 3 refills | Status: DC
Start: 2023-11-01 — End: 2023-11-02

## 2023-11-01 NOTE — Progress Notes (Signed)
   11/01/2023  Patient ID: Toni [name], female   DOB: 05-22-1946, 78 y.o.   MRN: [id number]   I called the patient this morning regarding losartan  adherence, and she returned the call. She was previously prescribed losartan  25 mg twice daily (last filled October 2024 for a 90-day supply) but switched to 25 mg once daily due to positional dizziness. She denies non-adherence and reports continued use of losartan  25 mg daily.  Although the once-daily dose is on file, the prescription expired, and she mistakenly filled the 25 mg twice daily today. I advised holding the twice-daily prescription and will contact cardiology to issue a corrected order. She reports having one dose remaining.  Alexandria Angel, PharmD Clinical Pharmacist Cell: [phone]

## 2023-11-01 NOTE — Telephone Encounter (Signed)
 Losartan  25 mg daily 90 R3 sent to pt's pharmacy

## 2023-11-01 NOTE — Progress Notes (Signed)
     11/01/2023 Name: Toni [name] MRN: [id number] DOB: 05/08/1946  Attempted to contact patient for medication management/review. Left HIPAA compliant message for patient to return my call at their convenience.   Third attempt for patient outreach. Will follow up with patient if it is needed. Losartan  still has not been picked up 04/23/2023. Will contact cardiologist for clarification.   Thank you for allowing pharmacy to be a part of this patient's care.  Alexandria Angel, PharmD Clinical Pharmacist Cell: [phone]

## 2023-11-02 ENCOUNTER — Telehealth: Payer: Self-pay | Admitting: Physician Assistant

## 2023-11-02 DIAGNOSIS — I10 Essential (primary) hypertension: Secondary | ICD-10-CM

## 2023-11-02 MED ORDER — LOSARTAN POTASSIUM 25 MG PO TABS
25.0000 mg | ORAL_TABLET | Freq: Every day | ORAL | 3 refills | Status: AC
Start: 2023-11-02 — End: ?

## 2023-11-02 NOTE — Telephone Encounter (Signed)
*  STAT* If patient is at the pharmacy, call can be transferred to refill team.   1. Which medications need to be refilled? (please list name of each medication and dose if known) losartan  (COZAAR ) 25 MG tablet    2. Would you like to learn more about the convenience, safety, & potential cost savings by using the [facility]? No   3. Are you open to using the [facility] (Type [facility]. ) No   4. Which pharmacy/location (including street and city if local pharmacy) is medication to be sent to?  [facility] - [place], [place] - [address]   5. Do they need a 30 day or 90 day supply? 90 day

## 2023-11-02 NOTE — Telephone Encounter (Signed)
Refill sent for Losartan 25 mg  

## 2023-11-15 ENCOUNTER — Other Ambulatory Visit: Payer: Self-pay

## 2023-11-19 ENCOUNTER — Telehealth: Payer: Self-pay

## 2023-11-19 DIAGNOSIS — Z79899 Other long term (current) drug therapy: Secondary | ICD-10-CM

## 2023-11-19 NOTE — Progress Notes (Signed)
   11/19/2023  Patient ID: Toni [name], female   DOB: 1946/04/21, 78 y.o.   MRN: [id number]    Medication Adherence Report:   Losartan  25 mg daily last picked up on 11/01/2023 for 15 days supply (loaned to patient). Will sent message to patient to refill medication.

## 2023-11-22 ENCOUNTER — Ambulatory Visit: Payer: PPO | Admitting: Nurse Practitioner

## 2023-11-26 NOTE — Progress Notes (Unsigned)
 Cardiology Office Note    Date:  12/01/2023   ID:  Toni, [name] [birth year], MRN [id number]  PCP:  Toni Pons, NP  Cardiologist:  Toni Kirks, MD  Electrophysiologist:  None   Chief Complaint: Follow-up  History of Present Illness:   Toni [name] is a 78 y.o. female with history of coronary artery calcification, aortic atherosclerosis, DM2, HTN, HLD, hypothyroidism, obesity, and mild sleep apnea that does not require CPAP usage who presents for follow-up of coronary artery calcification and HTN.   She was previously evaluated by Dr. Alvenia [name] in 2016 for atypical chest pain.  CT of the abdomen showed aortic atherosclerosis.  Carotid Doppler showed mild intimal thickening with calcified plaque bilaterally.  ABI was normal bilaterally.  Echo showed normal LV systolic function with no significant valvular abnormalities.  Lexiscan  MPI showed no evidence of ischemia with a normal EF and was overall low risk.  She was referred back to Dr. Alvenia [name] in 09/2021 for evaluation of hypertension with intolerance to multiple medications including ACE inhibitor and ARB, due to dry cough.  It was noted she had previously been on amlodipine  and diltiazem , though these did not adequately control her blood pressure.  She had previously been on beta-blocker for migraines, though this led to bradycardia.  She was subsequently placed on nifedipine , though noted increased lower extremity swelling involving the left leg with this.  She had been on HCTZ for quite a while and was tolerating this medication.  She reported her blood pressure became uncontrolled over the preceding year.  There was some mild exertional dyspnea and fatigue.  She was without chest pain.  Blood pressure in the office was elevated at 152/80.  Elevated BP was felt to likely be essential in etiology.  Given lower extremity swelling, nifedipine  was stopped, HCTZ was transitioned to chlorthalidone , and carvedilol  was added.  Given fatigue  and dyspnea, as well as murmur suggestive of aortic sclerosis on exam, she underwent echo on 09/30/2021 which demonstrated an EF of 60 to 65%, no regional wall motion abnormalities, grade 1 diastolic dysfunction, normal RV systolic function and ventricular cavity size, mild mitral regurgitation, and an estimated right atrial pressure of 3 mmHg.  She was seen in follow up on 10/07/2021 and was doing well.  Since initiating carvedilol  and transitioning HCTZ to chlorthalidone , she continued to note elevated BP readings typically in the 150s to 170s systolic.  However, since undergoing these medication changes she had only had one systolic blood pressure reading greater than 200 mmHg.  She did note some increase in fatigue following the initiation of carvedilol .  Following the discontinuation of nifedipine  she noted resolution of lower extremity swelling.  Coreg  was titrated to 12.5 mg bid and she was continued on chlorthalidone  25 mg.  Renal artery ultrasound showed no evidence of RAS bilaterally.  Rennin aldosterone and plasma metanephrines were normal.     She was seen in the office in 11/2021 and noted improvement in BP readings at home with most readings in the 140s systolic.  She was adjusting to some fatigue associated with carvedilol .  She was seen in the office on 05/14/2022 and continued to note fatigue following the initiation and subsequent titration of carvedilol .  Blood pressure readings at home were in the 160s to 180s systolic.  She preferred to discontinue carvedilol .  Despite discontinuation of ACE inhibitor/ARB she continued to note a cough.  Blood pressure in the office was 140/80.  Carvedilol  was discontinued and she was initiated  on spironolactone  25 mg daily.   She was seen in the office on 06/08/2022 and noted an improvement in her fatigue following discontinuation of carvedilol  and felt like her energy was back to baseline.  Cough persisted despite discontinuation of ACE inhibitor and ARB and has  been present since her COVID illness.  She also reported a recent infection with RSV which she felt like was contributing to her cough.  Blood pressure in the office was 166/82, spironolactone  was titrated to 25 mg twice daily.    She was seen in the office in 08/2022 and underwent a rechallenge of losartan  25 mg daily with continuation of spironolactone  25 mg twice daily and chlorthalidone  25 mg daily.     She was seen in the office in 10/2022 and losartan  was titrated to 25 mg twice daily with continuation of chlorthalidone  25 mg daily and spironolactone  25 mg twice daily.  She underwent CT of the chest for chronic cough which demonstrated multivessel coronary artery calcification, aortic atherosclerosis, and mild bronchial wall thickening.  In this setting, she was evaluated in 11/2022 and reported a slight increase in exertional dyspnea with activity such as mowing the lawn.  Following titration of losartan , she had some positional dizziness leading her to decrease back to 25 mg daily.  Lexiscan  MPI 12/04/2022 showed no evidence of ischemia or infarction with an EF of 71%.  CT attenuated images showed mild aortic atherosclerosis.  Abnormal EKG at baseline with deeply inverted T waves in the inferior leads.  Overall, this was a low risk study.  She is undergoing evaluation by pulmonology for chronic cough.   She was last seen in the office in 06/2023 and was tolerating CPAP.  In the setting of continued elevations in LDL she was subsequently started on Repatha .  She comes in doing well from a cardiac perspective and is without symptoms of angina or cardiac decompensation.  No dizziness, presyncope, or syncope.  No lower extremity swelling or progressive orthopnea.  Remains active at baseline without cardiac limitation.  She has noted an association with diarrhea with Vascepa  taking this medication 2 capsules nightly rather than 2 capsules twice daily with improvement in GI symptoms.  Tolerating Repatha .  No  longer on Nexlizet .  Blood pressure well-controlled.   Labs independently reviewed: 05/2023 - TC 234, TG 198, HDL 42, LDL 156, albumin 4.5, AST/ALT normal 03/2023 - BUN 23, serum creatinine 1.07, potassium 4.5, Hgb 14.5, PLT 235 02/2023 - A1c 6.4 06/2022 - TSH 0.110, free T4 normal  Past Medical History:  Diagnosis Date   Allergic rhinitis    Allergy    Aortic atherosclerosis (HCC)    Diabetes mellitus, type 2 (HCC)    no medicines- was pre diabtes- diet controlled    Diverticulitis    Essential hypertension    GERD (gastroesophageal reflux disease)    Hyperlipidemia    Hypertension    Hypothyroidism    Obesity    Osteoporosis    Sleep apnea    mild -no cpap   Transient insomnia    Tubular adenoma of colon 05/2013    Past Surgical History:  Procedure Laterality Date   APPENDECTOMY     BUNIONECTOMY     right foot   CARDIAC CATHETERIZATION  1994   [facility]    CATARACT EXTRACTION, BILATERAL Left 10/20/2017   CATARACT EXTRACTION, BILATERAL Right 10/27/2017   CHOLECYSTECTOMY     COLONOSCOPY  05/25/2016   Dr.Stark   COLONOSCOPY     March 2023  HAMMER TOE SURGERY  12/2012   left toe   PARTIAL HYSTERECTOMY     POLYPECTOMY     ROBOTIC ASSISTED SALPINGO OOPHERECTOMY     right ovary first removed then left ovary removed years later   TONSILLECTOMY  age 23   TOTAL THYROIDECTOMY  09/2011    Current Medications: Current Meds  Medication Sig   aspirin 81 MG tablet Take 81 mg by mouth daily.   Bempedoic Acid -Ezetimibe  (NEXLIZET ) 180-10 MG TABS Take 1 tablet by mouth daily.   Calcium Carbonate-Vitamin D  (CALCIUM + D PO) Take 1 tablet by mouth daily.    cetirizine (ZYRTEC) 10 MG tablet Take 10 mg by mouth as needed.    chlorthalidone  (HYGROTON ) 25 MG tablet TAKE ONE TABLET BY MOUTH ONCE A DAY   cholecalciferol (VITAMIN D ) 1000 units tablet Take 1,000 Units by mouth daily.   Evolocumab  (REPATHA  SURECLICK) 140 MG/ML SOAJ Inject 140 mg into the skin every 14 (fourteen)  days.   icosapent  Ethyl (VASCEPA ) 1 g capsule Take 2 capsules (2 g total) by mouth 2 (two) times daily.   losartan  (COZAAR ) 25 MG tablet Take 1 tablet (25 mg total) by mouth daily.   pantoprazole  (PROTONIX ) 40 MG tablet TAKE ONE TABLET BY MOUTH ONCE A DAY   spironolactone  (ALDACTONE ) 25 MG tablet TAKE ONE TABLET BY MOUTH TWO TIMES DAILY   SYNTHROID  112 MCG tablet Take 1 tablet (112 mcg total) by mouth every morning.    Allergies:   Cefaclor, Penicillins, and Talwin [pentazocine]   Social History   Socioeconomic History   Marital status: Married    Spouse name: Not on file   Number of children: 2   Years of education: Not on file   Highest education level: Not on file  Occupational History   Occupation: Retired  Tobacco Use   Smoking status: Former    Current packs/day: 0.00    Average packs/day: 1 pack/day for 30.0 years (30.0 ttl pk-yrs)    Types: Cigarettes    Start date: 07/06/1968    Quit date: 07/06/1998    Years since quitting: 25.4   Smokeless tobacco: Never  Vaping Use   Vaping status: Never Used  Substance and Sexual Activity   Alcohol use: No    Alcohol/week: 0.0 standard drinks of alcohol   Drug use: No   Sexual activity: Not on file  Other Topics Concern   Not on file  Social History Narrative   Not on file   Social Drivers of Health   Financial Resource Strain: Low Risk  (12/24/2020)   Overall Financial Resource Strain (CARDIA)    Difficulty of Paying Living Expenses: Not hard at all  Food Insecurity: Not on file  Transportation Needs: Not on file  Physical Activity: Not on file  Stress: Not on file  Social Connections: Not on file     Family History:  The patient's family history includes Breast cancer in her cousin; Colon cancer in her maternal aunt and maternal uncle; Colon polyps in her maternal aunt and maternal uncle; Esophageal cancer in her father; Heart disease in her maternal uncle; Prostate cancer in her maternal uncle; Stomach cancer in her  maternal uncle; Throat cancer in her father. There is no history of Diabetes, Kidney disease, Gallbladder disease, or Rectal cancer.  ROS:   12-point review of systems is negative unless otherwise noted in the HPI.   EKGs/Labs/Other Studies Reviewed:    Studies reviewed were summarized above. The additional studies were reviewed today:  Lexiscan  MPI 12/04/2022:   The study is normal. The study is low risk.   No ST deviation was noted.   LV perfusion is normal. There is no evidence of ischemia. There is no evidence of infarction.   Left ventricular function is normal. Nuclear stress EF: 71 %. The left ventricular ejection fraction is hyperdynamic (>65%). End diastolic cavity size is normal. End systolic cavity size is normal.   CT attenuation images shows mild aortic calcifications.   Abnormal baseline EKG with deeply inverted T waves in the inferior leads. __________   Renal artery ultrasound 10/10/2021: Renal:    Right: Normal size right kidney. Normal right Resisitive Index.         Normal cortical thickness of right kidney. RRV flow present.         No evidence of right renal artery stenosis.  Left:  LRV flow present. No evidence of left renal artery stenosis.         Normal size of left kidney. Normal left Resistive Index.         Normal cortical thickness of the left kidney.  Mesenteric:  Normal Celiac artery and Superior Mesenteric artery findings.  __________   2D echo 09/30/2021: 1. Left ventricular ejection fraction, by estimation, is 60 to 65%. The  left ventricle has normal function. The left ventricle has no regional  wall motion abnormalities. Left ventricular diastolic parameters are  consistent with Grade I diastolic  dysfunction (impaired relaxation).   2. Right ventricular systolic function is normal. The right ventricular  size is normal.   3. The mitral valve is normal in structure. Mild mitral valve  regurgitation. No evidence of mitral stenosis.   4. The  aortic valve is tricuspid. Aortic valve regurgitation is not  visualized. No aortic stenosis is present.   5. The inferior vena cava is normal in size with greater than 50%  respiratory variability, suggesting right atrial pressure of 3 mmHg. __________   Lexiscan  MPI 12/19/2014: Pharmacological myocardial perfusison imaging study with no significnat ischemia. The left ventricular ejection fraction is hyperdynamic (>65%). No wall motion abnormality noted. No EKG changes concerning for ischemia. This is a low risk study.   EKG:  EKG is ordered today.  The EKG ordered today demonstrates NSR, 63 bpm, no acute ST-T changes  Recent Labs: 03/22/2023: BUN 23; Creatinine, Ser 1.07; Hemoglobin 14.5; Platelets 235; Potassium 4.5; Sodium 142 05/27/2023: ALT 26  Recent Lipid Panel    Component Value Date/Time   CHOL 234 (H) 05/27/2023 0846   TRIG 198 (H) 05/27/2023 0846   HDL 42 05/27/2023 0846   CHOLHDL 5.6 (H) 05/27/2023 0846   LDLCALC 156 (H) 05/27/2023 0846    PHYSICAL EXAM:    VS:  BP 128/72   Pulse 72   Ht 5' 6.5" (1.689 m)   Wt 165 lb 9.6 oz (75.1 kg)   SpO2 97%   BMI 26.33 kg/m   BMI: Body mass index is 26.33 kg/m.  Physical Exam Vitals reviewed.  Constitutional:      Appearance: She is well-developed.  HENT:     Head: Normocephalic and atraumatic.  Eyes:     General:        Right eye: No discharge.        Left eye: No discharge.  Cardiovascular:     Rate and Rhythm: Normal rate and regular rhythm.     Heart sounds: Normal heart sounds, S1 normal and S2 normal. Heart sounds not distant. No midsystolic click and  no opening snap. No murmur heard.    No friction rub.  Pulmonary:     Effort: Pulmonary effort is normal. No respiratory distress.     Breath sounds: Normal breath sounds. No decreased breath sounds, wheezing, rhonchi or rales.  Chest:     Chest wall: No tenderness.  Musculoskeletal:     Cervical back: Normal range of motion.  Skin:    General: Skin is  warm and dry.     Nails: There is no clubbing.  Neurological:     Mental Status: She is alert and oriented to person, place, and time.  Psychiatric:        Speech: Speech normal.        Behavior: Behavior normal.        Thought Content: Thought content normal.        Judgment: Judgment normal.     Wt Readings from Last 3 Encounters:  12/01/23 165 lb 9.6 oz (75.1 kg)  06/10/23 158 lb 3.2 oz (71.8 kg)  05/26/23 160 lb 9.6 oz (72.8 kg)     ASSESSMENT & PLAN:   Coronary artery calcification/aortic atherosclerosis/HLD and hypertriglyceridemia with statin intolerance: Remains active at baseline and is without symptoms suggestive of angina or cardiac decompensation.  Continue aggressive risk factor modification and primary prevention including aspirin 81 mg, Repatha  140 mg injected every 14 days, and Vascepa  (currently taking 2 g nightly due to diarrhea).  Could consider placing her back on fenofibrate  given diarrhea noted with Vascepa .  Check LFT and lipid panel (currently fasting).  No indication for further cardiac testing at this time.  HTN: Blood pressure is well-controlled in the office today.  She remains on chlorthalidone  25 mg, losartan  25 mg, and spironolactone  25 mg twice daily.  Check BMP.  Chronic cough: Longstanding issue and is persistent despite medication holidays of ACE inhibitor's and ARB's as well as with PPI for GERD.  Prior echo without significant structural abnormalities.  Euvolemic.  Ongoing management per pulmonology.     Disposition: F/u with Dr. Alvenia [name] or an APP in 6 months.   Medication Adjustments/Labs and Tests Ordered: Current medicines are reviewed at length with the patient today.  Concerns regarding medicines are outlined above. Medication changes, Labs and Tests ordered today are summarized above and listed in the Patient Instructions accessible in Encounters.   Signed, Varney Gentleman, PA-C 12/01/2023 12:52 PM     [facility] - [place] [address] [place], [place] [address] [phone]

## 2023-11-30 ENCOUNTER — Other Ambulatory Visit: Payer: Self-pay | Admitting: Cardiovascular Disease

## 2023-12-01 ENCOUNTER — Ambulatory Visit: Payer: PPO | Attending: Physician Assistant | Admitting: Physician Assistant

## 2023-12-01 ENCOUNTER — Encounter: Payer: Self-pay | Admitting: Physician Assistant

## 2023-12-01 VITALS — BP 128/72 | HR 72 | Ht 66.5 in | Wt 165.6 lb

## 2023-12-01 DIAGNOSIS — Z789 Other specified health status: Secondary | ICD-10-CM

## 2023-12-01 DIAGNOSIS — I2510 Atherosclerotic heart disease of native coronary artery without angina pectoris: Secondary | ICD-10-CM | POA: Diagnosis not present

## 2023-12-01 DIAGNOSIS — I700 Atherosclerosis of aorta: Secondary | ICD-10-CM | POA: Diagnosis not present

## 2023-12-01 DIAGNOSIS — E785 Hyperlipidemia, unspecified: Secondary | ICD-10-CM

## 2023-12-01 DIAGNOSIS — E781 Pure hyperglyceridemia: Secondary | ICD-10-CM

## 2023-12-01 DIAGNOSIS — I10 Essential (primary) hypertension: Secondary | ICD-10-CM | POA: Diagnosis not present

## 2023-12-01 DIAGNOSIS — Z79899 Other long term (current) drug therapy: Secondary | ICD-10-CM

## 2023-12-01 DIAGNOSIS — R053 Chronic cough: Secondary | ICD-10-CM | POA: Diagnosis not present

## 2023-12-01 NOTE — Patient Instructions (Signed)
 Medication Instructions:   Your physician recommends that you continue on your current medications as directed. Please refer to the Current Medication list given to you today.   *If you need a refill on your cardiac medications before your next appointment, please call your pharmacy*  Lab Work:  Your provider would like for you to have following labs drawn today CMET, LIPID PANEL.   If you have labs (blood work) drawn today and your tests are completely normal, you will receive your results only by: MyChart Message (if you have MyChart) OR A paper copy in the mail If you have any lab test that is abnormal or we need to change your treatment, we will call you to review the results.  Testing/Procedures:  No test ordered today   Follow-Up: At [facility], you and your health needs are our priority.  As part of our continuing mission to provide you with exceptional heart care, our providers are all part of one team.  This team includes your primary Cardiologist (physician) and Advanced Practice Providers or APPs (Physician Assistants and Nurse Practitioners) who all work together to provide you with the care you need, when you need it.  Your next appointment:   6 month(s)  Provider:    Antionette Kirks, MD or Varney Gentleman, PA-C

## 2023-12-02 ENCOUNTER — Ambulatory Visit: Payer: Self-pay | Admitting: Physician Assistant

## 2023-12-02 ENCOUNTER — Other Ambulatory Visit: Payer: Self-pay | Admitting: Physician Assistant

## 2023-12-02 DIAGNOSIS — E785 Hyperlipidemia, unspecified: Secondary | ICD-10-CM

## 2023-12-02 LAB — COMPREHENSIVE METABOLIC PANEL WITH GFR
ALT: 24 IU/L (ref 0–32)
AST: 23 IU/L (ref 0–40)
Albumin: 4.4 g/dL (ref 3.8–4.8)
Alkaline Phosphatase: 64 IU/L (ref 44–121)
BUN/Creatinine Ratio: 22 (ref 12–28)
BUN: 21 mg/dL (ref 8–27)
Bilirubin Total: 0.8 mg/dL (ref 0.0–1.2)
CO2: 23 mmol/L (ref 20–29)
Calcium: 9.8 mg/dL (ref 8.7–10.3)
Chloride: 100 mmol/L (ref 96–106)
Creatinine, Ser: 0.97 mg/dL (ref 0.57–1.00)
Globulin, Total: 2.4 g/dL (ref 1.5–4.5)
Glucose: 140 mg/dL — ABNORMAL HIGH (ref 70–99)
Potassium: 4.3 mmol/L (ref 3.5–5.2)
Sodium: 141 mmol/L (ref 134–144)
Total Protein: 6.8 g/dL (ref 6.0–8.5)
eGFR: 61 mL/min/{1.73_m2} (ref >59)

## 2023-12-02 LAB — LIPID PANEL
Chol/HDL Ratio: 4 ratio (ref 0.0–4.4)
Cholesterol, Total: 185 mg/dL (ref 100–199)
HDL: 46 mg/dL (ref >39)
LDL Chol Calc (NIH): 111 mg/dL — ABNORMAL HIGH (ref 0–99)
Triglycerides: 162 mg/dL — ABNORMAL HIGH (ref 0–149)
VLDL Cholesterol Cal: 28 mg/dL (ref 5–40)

## 2023-12-02 NOTE — Progress Notes (Signed)
 Removed Nexlizet  from medication list as she is no longer taking this.

## 2023-12-06 ENCOUNTER — Ambulatory Visit (INDEPENDENT_AMBULATORY_CARE_PROVIDER_SITE_OTHER): Admitting: Nurse Practitioner

## 2023-12-06 ENCOUNTER — Encounter: Payer: Self-pay | Admitting: Nurse Practitioner

## 2023-12-06 VITALS — BP 134/72 | HR 74 | Temp 97.7°F | Resp 16 | Ht 66.5 in | Wt 163.8 lb

## 2023-12-06 DIAGNOSIS — I10 Essential (primary) hypertension: Secondary | ICD-10-CM | POA: Diagnosis not present

## 2023-12-06 DIAGNOSIS — E119 Type 2 diabetes mellitus without complications: Secondary | ICD-10-CM

## 2023-12-06 DIAGNOSIS — E039 Hypothyroidism, unspecified: Secondary | ICD-10-CM

## 2023-12-06 DIAGNOSIS — I700 Atherosclerosis of aorta: Secondary | ICD-10-CM | POA: Diagnosis not present

## 2023-12-06 DIAGNOSIS — R053 Chronic cough: Secondary | ICD-10-CM | POA: Diagnosis not present

## 2023-12-06 LAB — POCT GLYCOSYLATED HEMOGLOBIN (HGB A1C): Hemoglobin A1C: 6.5 % — AB (ref 4.0–5.6)

## 2023-12-06 MED ORDER — SYNTHROID 112 MCG PO TABS: 112.0000 ug | ORAL_TABLET | Freq: Every morning | ORAL | 1 refills | Status: DC

## 2023-12-06 MED ORDER — EZETIMIBE 10 MG PO TABS: 10.0000 mg | ORAL_TABLET | Freq: Every day | ORAL | 3 refills | Status: AC

## 2023-12-06 MED ORDER — LEVOCETIRIZINE DIHYDROCHLORIDE 5 MG PO TABS: 5.0000 mg | ORAL_TABLET | Freq: Every evening | ORAL | 1 refills | Status: DC

## 2023-12-06 NOTE — Telephone Encounter (Signed)
 RX sent to pharmacy  Lab work ordered.

## 2023-12-06 NOTE — Progress Notes (Signed)
 [facility] [address] [place], [place] [address]  Internal MEDICINE  Office Visit Note  Patient Name: Toni [name]  [id number]  [id number]  Date of Service: 12/06/2023  Chief Complaint  Patient presents with   Diabetes   Gastroesophageal Reflux   Hyperlipidemia   Hypertension   Follow-up     Len presents for a follow-up visit for diabetes, hypertension, high cholesterol, hypothyroidism and chronic cough.  Diabetes -- A1c is not significantly changed but still stable.  Hypertension -- finally well controlled with losartan , spironolactone  and chlorthalidone  High cholesterol -- on repatha  and vascepa  and getting ready to start ezetimibe .  Hypothyroidism -- has not had thyroid  lab checked in a while. Taking synthroid  with no issues.  Chronic cough -- seeing pulmonology for chronic cough. Was put on zyrtec-D but this can increase BP due to the decongestant. Ok with trying an alternative.     Current Medication: Outpatient Encounter Medications as of 12/06/2023  Medication Sig   aspirin 81 MG tablet Take 81 mg by mouth daily.   Calcium Carbonate-Vitamin D  (CALCIUM + D PO) Take 1 tablet by mouth daily.    cetirizine (ZYRTEC) 10 MG tablet Take 10 mg by mouth as needed.    chlorthalidone  (HYGROTON ) 25 MG tablet TAKE ONE TABLET BY MOUTH ONCE A DAY   cholecalciferol (VITAMIN D ) 1000 units tablet Take 1,000 Units by mouth daily.   Evolocumab  (REPATHA  SURECLICK) 140 MG/ML SOAJ Inject 140 mg into the skin every 14 (fourteen) days.   ezetimibe  (ZETIA ) 10 MG tablet Take 1 tablet (10 mg total) by mouth daily.   icosapent  Ethyl (VASCEPA ) 1 g capsule Take 2 capsules (2 g total) by mouth 2 (two) times daily.   levocetirizine (XYZAL) 5 MG tablet Take 1 tablet (5 mg total) by mouth every evening.   losartan  (COZAAR ) 25 MG tablet Take 1 tablet (25 mg total) by mouth daily.   pantoprazole  (PROTONIX ) 40 MG tablet TAKE ONE TABLET BY MOUTH ONCE A DAY   spironolactone  (ALDACTONE ) 25  MG tablet TAKE ONE TABLET BY MOUTH TWO TIMES DAILY   [DISCONTINUED] SYNTHROID  112 MCG tablet Take 1 tablet (112 mcg total) by mouth every morning.   SYNTHROID  112 MCG tablet Take 1 tablet (112 mcg total) by mouth every morning.   No facility-administered encounter medications on file as of 12/06/2023.    Surgical History: Past Surgical History:  Procedure Laterality Date   APPENDECTOMY     BUNIONECTOMY     right foot   C[facility]  [facility]    CATARACT EXTRACTION, BILATERAL Left 10/20/2017   CATARACT EXTRACTION, BILATERAL Right 10/27/2017   CHOLECYSTECTOMY     COLONOSCOPY  05/25/2016   Dr.Stark   COLONOSCOPY     March 2023   HAMMER TOE SURGERY  12/2012   left toe   PARTIAL HYSTERECTOMY     POLYPECTOMY     ROBOTIC ASSISTED SALPINGO OOPHERECTOMY     right ovary first removed then left ovary removed years later   TONSILLECTOMY  age 77   TOTAL THYROIDECTOMY  09/2011    Medical History: Past Medical History:  Diagnosis Date   Allergic rhinitis    Allergy    Aortic atherosclerosis (HCC)    Diabetes mellitus, type 2 (HCC)    no medicines- was pre diabtes- diet controlled    Diverticulitis    Essential hypertension    GERD (gastroesophageal reflux disease)    Hyperlipidemia    Hypertension    Hypothyroidism  Obesity    Osteoporosis    Sleep apnea    mild -no cpap   Transient insomnia    Tubular adenoma of colon 05/2013    Family History: Family History  Problem Relation Age of Onset   Throat cancer Father    Esophageal cancer Father    Colon cancer Maternal Aunt    Colon polyps Maternal Aunt    Colon cancer Maternal Uncle    Colon polyps Maternal Uncle    Heart disease Maternal Uncle    Stomach cancer Maternal Uncle    Prostate cancer Maternal Uncle    Breast cancer Cousin    Diabetes Neg Hx    Kidney disease Neg Hx    Gallbladder disease Neg Hx    Rectal cancer Neg Hx     Social History   Socioeconomic History   Marital  status: Married    Spouse name: Not on file   Number of children: 2   Years of education: Not on file   Highest education level: Not on file  Occupational History   Occupation: Retired  Tobacco Use   Smoking status: Former    Current packs/day: 0.00    Average packs/day: 1 pack/day for 30.0 years (30.0 ttl pk-yrs)    Types: Cigarettes    Start date: 07/06/1968    Quit date: 07/06/1998    Years since quitting: 25.4   Smokeless tobacco: Never  Vaping Use   Vaping status: Never Used  Substance and Sexual Activity   Alcohol use: No    Alcohol/week: 0.0 standard drinks of alcohol   Drug use: No   Sexual activity: Not on file  Other Topics Concern   Not on file  Social History Narrative   Not on file   Social Drivers of Health   Financial Resource Strain: Low Risk  (12/24/2020)   Overall Financial Resource Strain (CARDIA)    Difficulty of Paying Living Expenses: Not hard at all  Food Insecurity: Not on file  Transportation Needs: Not on file  Physical Activity: Not on file  Stress: Not on file  Social Connections: Not on file  Intimate Partner Violence: Not on file      Review of Systems  Constitutional:  Negative for activity change, appetite change, chills, fatigue, fever and unexpected weight change.  HENT: Negative.  Negative for congestion, ear pain, rhinorrhea, sore throat and trouble swallowing.   Eyes: Negative.   Respiratory:  Positive for cough (chronic). Negative for chest tightness, shortness of breath and wheezing.   Cardiovascular: Negative.  Negative for chest pain.  Gastrointestinal: Negative.  Negative for abdominal pain, blood in stool, constipation, diarrhea, nausea and vomiting.  Endocrine: Negative.   Genitourinary: Negative.  Negative for difficulty urinating, dysuria, frequency, hematuria and urgency.  Musculoskeletal:  Positive for arthralgias. Negative for back pain, joint swelling, myalgias and neck pain.  Skin: Negative.  Negative for rash and  wound.  Allergic/Immunologic: Negative.  Negative for immunocompromised state.  Neurological: Negative.  Negative for dizziness, seizures, numbness and headaches.  Hematological: Negative.   Psychiatric/Behavioral: Negative.  Negative for behavioral problems, self-injury and suicidal ideas. The patient is not nervous/anxious.     Vital Signs: BP 134/72   Pulse 74   Temp 97.7 F (36.5 C)   Resp 16   Ht 5' 6.5" (1.689 m)   Wt 163 lb 12.8 oz (74.3 kg)   SpO2 97%   BMI 26.04 kg/m    Physical Exam Vitals reviewed.  Constitutional:  General: She is not in acute distress.    Appearance: Normal appearance. She is not ill-appearing.  HENT:     Head: Normocephalic and atraumatic.  Eyes:     Pupils: Pupils are equal, round, and reactive to light.  Cardiovascular:     Rate and Rhythm: Normal rate and regular rhythm.  Pulmonary:     Effort: Pulmonary effort is normal. No respiratory distress.  Neurological:     Mental Status: She is alert and oriented to person, place, and time.  Psychiatric:        Mood and Affect: Mood normal.        Behavior: Behavior normal.        Assessment/Plan: 1. Diet-controlled type 2 diabetes mellitus (HCC) (Primary) A1c remains stable, and she is diet controlled. No changes in current treatment plan.  - POCT glycosylated hemoglobin (Hb A1C)  2. Essential hypertension Stable on current medications as managed by cardiology.   3. Aortic atherosclerosis (HCC) Continue repatha  and vascepa , start zetia  per cardiology   4. Acquired hypothyroidism Thyroid  labs ordered. Continue current dose of synthroid  for now, if needs to be adjusted, will notify patient.  - SYNTHROID  112 MCG tablet; Take 1 tablet (112 mcg total) by mouth every morning.  Dispense: 90 tablet; Refill: 1 - TSH + free T4  5. Chronic cough Try levocetirizine instead of zyrtec-D.  - levocetirizine (XYZAL) 5 MG tablet; Take 1 tablet (5 mg total) by mouth every evening.  Dispense:  90 tablet; Refill: 1   General Counseling: Mort Ards understanding of the findings of todays visit and agrees with plan of treatment. I have discussed any further diagnostic evaluation that may be needed or ordered today. We also reviewed her medications today. she has been encouraged to call the office with any questions or concerns that should arise related to todays visit.    Orders Placed This Encounter  Procedures   TSH + free T4   POCT glycosylated hemoglobin (Hb A1C)    Meds ordered this encounter  Medications   SYNTHROID  112 MCG tablet    Sig: Take 1 tablet (112 mcg total) by mouth every morning.    Dispense:  90 tablet    Refill:  1    Discontinue levothyroxine  ordered, must fill as synthroid  thank you.   levocetirizine (XYZAL) 5 MG tablet    Sig: Take 1 tablet (5 mg total) by mouth every evening.    Dispense:  90 tablet    Refill:  1    Fill new script today    Return for previously scheduled, AWV, Leani Myron PCP in late november and otherwise as needed. .   Total time spent:30 Minutes Time spent includes review of chart, medications, test results, and follow up plan with the patient.   [place] Controlled Substance Database was reviewed by me.  This patient was seen by Laurence Pons, FNP-C in collaboration with Dr. Verneta Gone as a part of collaborative care agreement.   Aerilyn Slee R. Bobbi Burow, MSN, FNP-C Internal medicine

## 2023-12-17 ENCOUNTER — Other Ambulatory Visit: Payer: Self-pay

## 2023-12-27 ENCOUNTER — Telehealth: Payer: Self-pay

## 2023-12-27 NOTE — Progress Notes (Addendum)
   12/27/2023  Patient ID: Toni [name], female   DOB: Nov 04, 1945, 78 y.o.   MRN: [id number]  This patient is appearing on a report for being at risk of failing the adherence measure for identified medications this calendar year.   Medication Adherence Summary (STAR/HEDIS Monitoring): Adherence Category: hypertension (ACEi/ARB)    Drug Name: Losartan  25 mg daily  Last Fill or Sold Date:last sold 11/30/2023  Days' Supply: 30   Drug Name: Losartan  25 mg daily Last Fill:12/27/2023 Days' Supply: 30     Notes: ? Adherence data pulled from pharmacy claims portal Dr. Annemarie. Last BP 134/72 mmHg (as of 12/06/23) ? Reviewed barriers to adherence: none identified. ? Plan: MyChart message sent to patient.  Dorcas Solian, PharmD Clinical Pharmacist Cell: [phone]

## 2024-01-11 ENCOUNTER — Other Ambulatory Visit: Payer: Self-pay

## 2024-01-11 ENCOUNTER — Telehealth: Payer: Self-pay | Admitting: Pharmacy Technician

## 2024-01-11 DIAGNOSIS — E039 Hypothyroidism, unspecified: Secondary | ICD-10-CM | POA: Diagnosis not present

## 2024-01-11 NOTE — Telephone Encounter (Signed)
 Pharmacy Patient Advocate Encounter   Received notification from Onbase that prior authorization for REPATHA  is required/requested.   Insurance verification completed.   The patient is insured through [facility] AD[employer]/RX ADVANCE .   Per test claim: PA required; PA submitted to above mentioned insurance via CoverMyMeds Key/confirmation #/[id number] Status is pending

## 2024-01-12 ENCOUNTER — Other Ambulatory Visit (HOSPITAL_COMMUNITY): Payer: Self-pay

## 2024-01-12 LAB — TSH+FREE T4
Free T4: 1.71 ng/dL (ref 0.82–1.77)
TSH: 0.114 u[IU]/mL — ABNORMAL LOW (ref 0.450–4.500)

## 2024-01-12 NOTE — Telephone Encounter (Signed)
 Pharmacy Patient Advocate Encounter  Received notification from [employer] that Prior Authorization for repatha  has been APPROVED from 01/11/24 to 01/10/25   PA #/Case ID/Reference #: [id number]  Ready at [facility]

## 2024-01-14 ENCOUNTER — Telehealth: Payer: Self-pay

## 2024-01-14 ENCOUNTER — Other Ambulatory Visit: Payer: Self-pay

## 2024-01-14 NOTE — Telephone Encounter (Signed)
 Spoke with patient regarding thyroid  labs. Per AA, patient to now take a 1/2 tab of Synthroid  on both Saturday and Sunday weekly. 1 full tab Mon-Fri.

## 2024-01-24 ENCOUNTER — Telehealth: Payer: Self-pay

## 2024-01-24 NOTE — Progress Notes (Signed)
   01/24/2024  Patient ID: Toni [name], female   DOB: June 10, 1946, 78 y.o.   MRN: [id number]  This patient is appearing on a report for being at risk of failing the adherence measure for identified medications this calendar year.   Medication Adherence Summary (STAR/HEDIS Monitoring): Adherence Category: hypertension (ACEi/ARB)   Drug Name: Losartan  25 mg daily Last Fill:12/27/2023 Days' Supply: 30     Notes: ? Adherence data was not pulled from pharmacy claims portal Dr. Annemarie. I called [facility] for the above information.  ? Reviewed barriers to adherence: none identified. ? Plan: MyChart message sent to patient.  Dorcas Solian, PharmD Clinical Pharmacist Cell: [phone]

## 2024-01-25 ENCOUNTER — Telehealth: Payer: Self-pay

## 2024-01-25 DIAGNOSIS — L259 Unspecified contact dermatitis, unspecified cause: Secondary | ICD-10-CM | POA: Diagnosis not present

## 2024-01-25 NOTE — Telephone Encounter (Signed)
 Pt called that her face is rash and close to her eye and also bump on her right wrist and pt went to the beach  and just came back advised her to go to urgent care

## 2024-01-27 DIAGNOSIS — D2261 Melanocytic nevi of right upper limb, including shoulder: Secondary | ICD-10-CM | POA: Diagnosis not present

## 2024-01-27 DIAGNOSIS — D225 Melanocytic nevi of trunk: Secondary | ICD-10-CM | POA: Diagnosis not present

## 2024-01-27 DIAGNOSIS — L821 Other seborrheic keratosis: Secondary | ICD-10-CM | POA: Diagnosis not present

## 2024-01-27 DIAGNOSIS — D2272 Melanocytic nevi of left lower limb, including hip: Secondary | ICD-10-CM | POA: Diagnosis not present

## 2024-01-27 DIAGNOSIS — L309 Dermatitis, unspecified: Secondary | ICD-10-CM | POA: Diagnosis not present

## 2024-01-27 DIAGNOSIS — D2262 Melanocytic nevi of left upper limb, including shoulder: Secondary | ICD-10-CM | POA: Diagnosis not present

## 2024-01-27 DIAGNOSIS — D2271 Melanocytic nevi of right lower limb, including hip: Secondary | ICD-10-CM | POA: Diagnosis not present

## 2024-02-02 DIAGNOSIS — Z1231 Encounter for screening mammogram for malignant neoplasm of breast: Secondary | ICD-10-CM | POA: Diagnosis not present

## 2024-02-07 ENCOUNTER — Other Ambulatory Visit: Payer: Self-pay

## 2024-02-07 ENCOUNTER — Telehealth: Payer: Self-pay

## 2024-02-07 NOTE — Progress Notes (Signed)
   02/07/2024  Patient ID: Toni [name], female   DOB: 23-Jan-1946, 78 y.o.   MRN: [id number]  This patient is appearing on a report for being at risk of failing the adherence measure for identified medications this calendar year.   Medication Adherence Summary (STAR/HEDIS Monitoring): Adherence Category: hypertension (ACEi/ARB)   Drug Name: Losartan  25 mg daily Last Sold:02/01/2024 Days' Supply: 30 (prescription written for 90 - days supply)     Notes: ? Adherence data was not pulled from pharmacy claims portal Dr. Annemarie. I called [facility] for the above information.  ? Reviewed barriers to adherence: none identified. ? Plan: Follow up next month for refill.   Dorcas Solian, PharmD Clinical Pharmacist Cell: [phone]

## 2024-03-07 ENCOUNTER — Other Ambulatory Visit: Payer: Self-pay

## 2024-03-15 ENCOUNTER — Telehealth: Payer: Self-pay

## 2024-03-15 NOTE — Progress Notes (Signed)
   03/15/2024  Patient ID: Orie LELON Glatter, female   DOB: 05/05/1946, 78 y.o.   MRN: [id number]  This patient is appearing on a report for being at risk of failing the adherence measure for identified medications this calendar year.   Medication Adherence Summary (STAR/HEDIS Monitoring): Adherence Category: hypertension (ACEi/ARB)   Drug Name: Losartan  25 mg daily Last Sold:02/28/2024 Days' Supply: 30 (prescription written for 90 - days supply)     Notes: ? Adherence data was not pulled from pharmacy claims portal Dr. Annemarie. I called [facility] for the above information.  ? Reviewed barriers to adherence: none identified. ? Plan: Follow up next month for refill.   Dorcas Solian, PharmD Clinical Pharmacist Cell: [phone]

## 2024-04-11 NOTE — Progress Notes (Signed)
   04/11/2024  Patient ID: Toni [name], female   DOB: 31-Jul-1945, 78 y.o.   MRN: [id number]  Pharmacy Quality Measure Review  This patient is appearing on a report for being at risk of failing the adherence measure for hypertension (ACEi/ARB) medications this calendar year.   Medication: Losartan  25mg  Last fill date: 03/29/24 for 30 day supply  Insurance report was not up to date. No action needed at this time.   Jon VEAR Lindau, PharmD Clinical Pharmacist [phone]

## 2024-04-24 ENCOUNTER — Other Ambulatory Visit: Payer: Self-pay | Admitting: Physician Assistant

## 2024-05-02 NOTE — Progress Notes (Signed)
   05/02/2024  Patient ID: Orie LELON Glatter, female   DOB: [birth year], 78 y.o.   MRN: [id number]  Pharmacy Quality Measure Review  This patient is appearing on a report for being at risk of failing the adherence measure for hypertension (ACEi/ARB) medications this calendar year.   Medication: Losartan  25mg  Last fill date: 04/24/24 for 30 day supply  Insurance report was not up to date. No action needed at this time.   Jon VEAR Lindau, PharmD Clinical Pharmacist [phone]

## 2024-05-09 ENCOUNTER — Ambulatory Visit: Attending: Physician Assistant | Admitting: Physician Assistant

## 2024-05-09 ENCOUNTER — Encounter: Payer: Self-pay | Admitting: Physician Assistant

## 2024-05-09 VITALS — BP 130/80 | HR 69 | Ht 66.0 in | Wt 164.0 lb

## 2024-05-09 DIAGNOSIS — Z789 Other specified health status: Secondary | ICD-10-CM | POA: Diagnosis not present

## 2024-05-09 DIAGNOSIS — E785 Hyperlipidemia, unspecified: Secondary | ICD-10-CM

## 2024-05-09 DIAGNOSIS — I2510 Atherosclerotic heart disease of native coronary artery without angina pectoris: Secondary | ICD-10-CM | POA: Diagnosis not present

## 2024-05-09 DIAGNOSIS — E781 Pure hyperglyceridemia: Secondary | ICD-10-CM

## 2024-05-09 DIAGNOSIS — I700 Atherosclerosis of aorta: Secondary | ICD-10-CM | POA: Diagnosis not present

## 2024-05-09 DIAGNOSIS — I10 Essential (primary) hypertension: Secondary | ICD-10-CM | POA: Diagnosis not present

## 2024-05-09 NOTE — Patient Instructions (Signed)
 Medication Instructions:  Your physician recommends that you continue on your current medications as directed. Please refer to the Current Medication list given to you today.   *If you need a refill on your cardiac medications before your next appointment, please call your pharmacy*  Lab Work: None ordered at this time  If you have labs (blood work) drawn today and your tests are completely normal, you will receive your results only by: MyChart Message (if you have MyChart) OR A paper copy in the mail If you have any lab test that is abnormal or we need to change your treatment, we will call you to review the results.  Testing/Procedures: None ordered at this time   Follow-Up: At [facility], you and your health needs are our priority.  As part of our continuing mission to provide you with exceptional heart care, our providers are all part of one team.  This team includes your primary Cardiologist (physician) and Advanced Practice Providers or APPs (Physician Assistants and Nurse Practitioners) who all work together to provide you with the care you need, when you need it.  Your next appointment:   6 month(s)  Provider:   You may see Deatrice Cage, MD or Bernardino Bring, PA-C

## 2024-05-09 NOTE — Progress Notes (Signed)
 Cardiology Office Note    Date:  05/09/2024   ID:  Toni [name], Toni [name] 1945/08/05, MRN [id number]  PCP:  Liana Fish, NP  Cardiologist:  Deatrice Cage, MD  Electrophysiologist:  None   Chief Complaint: Follow up  History of Present Illness:   Toni [name] is a 78 y.o. female with history of coronary artery calcification, aortic atherosclerosis, DM2, HTN, HLD, hypothyroidism, obesity, and mild sleep apnea that does not require CPAP usage who presents for follow-up of coronary artery calcification and HTN.   She was previously evaluated by Dr. Cage in 2016 for atypical chest pain.  CT of the abdomen showed aortic atherosclerosis.  Carotid Doppler showed mild intimal thickening with calcified plaque bilaterally.  ABI was normal bilaterally.  Echo showed normal LV systolic function with no significant valvular abnormalities.  Lexiscan  MPI showed no evidence of ischemia with a normal EF and was overall low risk.  She was referred back to Dr. Cage in 09/2021 for evaluation of hypertension with intolerance to multiple medications including ACE inhibitor and ARB, due to dry cough.  It was noted she had previously been on amlodipine  and diltiazem , though these did not adequately control her blood pressure.  She had previously been on beta-blocker for migraines, though this led to bradycardia.  She was subsequently placed on nifedipine , though noted increased lower extremity swelling involving the left leg with this.  She had been on HCTZ for quite a while and was tolerating this medication.  She reported her blood pressure became uncontrolled over the preceding year.  There was some mild exertional dyspnea and fatigue.  She was without chest pain.  Given lower extremity swelling, nifedipine  was stopped, HCTZ was transitioned to chlorthalidone , and carvedilol  was added.  Given fatigue and dyspnea, as well as murmur suggestive of aortic sclerosis on exam, she underwent echo on 09/30/2021 which  demonstrated an EF of 60 to 65%, no regional wall motion abnormalities, grade 1 diastolic dysfunction, normal RV systolic function and ventricular cavity size, mild mitral regurgitation, and an estimated right atrial pressure of 3 mmHg.  She was seen in follow up on 10/07/2021 and was doing well.  Since initiating carvedilol  and transitioning HCTZ to chlorthalidone , she continued to note elevated BP readings typically in the 150s to 170s systolic.  However, since undergoing these medication changes she had only had one systolic blood pressure reading greater than 200 mmHg.  She did note some increase in fatigue following the initiation of carvedilol .  Following the discontinuation of nifedipine  she noted resolution of lower extremity swelling.  Coreg  was titrated to 12.5 mg bid and she was continued on chlorthalidone  25 mg.  Renal artery ultrasound showed no evidence of RAS bilaterally.  Rennin aldosterone and plasma metanephrines were normal.     She was seen in the office in 11/2021 and noted improvement in BP readings at home with most readings in the 140s systolic.  She was adjusting to some fatigue associated with carvedilol .  She was seen in the office on 05/14/2022 and continued to note fatigue following the initiation and subsequent titration of carvedilol .  Blood pressure readings at home were in the 160s to 180s systolic.  She preferred to discontinue carvedilol .  Despite discontinuation of ACE inhibitor/ARB she continued to note a cough.  Blood pressure in the office was 140/80.  Carvedilol  was discontinued and she was initiated on spironolactone  25 mg daily.   She was seen in the office on 06/08/2022 and noted an improvement in  her fatigue following discontinuation of carvedilol  and felt like her energy was back to baseline.  Cough persisted despite discontinuation of ACE inhibitor and ARB and had been present since her COVID illness.  Blood pressure in the office was 166/82, spironolactone  was titrated  to 25 mg twice daily.  She was seen in the office in 08/2022 and underwent a rechallenge of losartan  25 mg daily with continuation of spironolactone  25 mg twice daily and chlorthalidone  25 mg daily.  She was seen in the office in 10/2022 and losartan  was titrated to 25 mg twice daily with continuation of chlorthalidone  25 mg daily and spironolactone  25 mg twice daily.  She underwent CT of the chest for chronic cough which demonstrated multivessel coronary artery calcification, aortic atherosclerosis, and mild bronchial wall thickening.  In this setting, she was evaluated in 11/2022 and reported a slight increase in exertional dyspnea with activity such as mowing the lawn.  Following titration of losartan , she had some positional dizziness leading her to decrease back to 25 mg daily.  Lexiscan  MPI 12/04/2022 showed no evidence of ischemia or infarction with an EF of 71%.  CT attenuated images showed mild aortic atherosclerosis.  Abnormal EKG at baseline with deeply inverted T waves in the inferior leads.  Overall, this was a low risk study.  She has been started on Repatha  for hyperlipidemia.  She was last seen in the office in 11/2023 with well-controlled blood pressure.  She reported an association with diarrhea with Vascepa , and was taking this 2 g nightly rather than 2 g twice daily.  She comes in doing well from a cardiac perspective and is without symptoms of angina or cardiac decompensation.  Not regularly checking blood pressure at home at this point.  Recent BP of 119/78 at the dentist office.  She does note some mild myalgias following the reinitiation of ezetimibe , though not as severe as what she experienced when on statin therapy.  Now back to taking Vascepa  2 g twice daily as she noted constipation when taking this once daily.  Adherent to Repatha .  Has follow-up fasting labs with PCP later this month.  No dizziness, presyncope, or syncope.  No falls or symptoms concerning for bleeding.   Labs  independently reviewed: 01/2024 - TSH 0.114, free T4 normal 12/2023 - A1c 6.5 11/2023 - TC 185, TG 162, HDL 46, LDL 111, BUN 21, serum creatinine 0.97, potassium 4.3, BUN 4.4, AST/ALT normal 03/2023 - Hgb 14.5, PLT 235  Past Medical History:  Diagnosis Date   Allergic rhinitis    Allergy    Aortic atherosclerosis    Diabetes mellitus, type 2 (HCC)    no medicines- was pre diabtes- diet controlled    Diverticulitis    Essential hypertension    GERD (gastroesophageal reflux disease)    Hyperlipidemia    Hypertension    Hypothyroidism    Obesity    Osteoporosis    Sleep apnea    mild -no cpap   Transient insomnia    Tubular adenoma of colon 05/2013    Past Surgical History:  Procedure Laterality Date   APPENDECTOMY     BUNIONECTOMY     right foot   CARDIAC CATHETERIZATION  1994   [facility]    CATARACT EXTRACTION, BILATERAL Left 10/20/2017   CATARACT EXTRACTION, BILATERAL Right 10/27/2017   CHOLECYSTECTOMY     COLONOSCOPY  05/25/2016   Dr.Stark   COLONOSCOPY     March 2023   HAMMER TOE SURGERY  12/2012  left toe   PARTIAL HYSTERECTOMY     POLYPECTOMY     ROBOTIC ASSISTED SALPINGO OOPHERECTOMY     right ovary first removed then left ovary removed years later   TONSILLECTOMY  age 18   TOTAL THYROIDECTOMY  09/2011    Current Medications: Current Meds  Medication Sig   aspirin 81 MG tablet Take 81 mg by mouth daily.   Calcium Carbonate-Vitamin D  (CALCIUM + D PO) Take 1 tablet by mouth daily.    cetirizine (ZYRTEC) 10 MG tablet Take 10 mg by mouth as needed.    chlorthalidone  (HYGROTON ) 25 MG tablet TAKE ONE TABLET BY MOUTH ONCE A DAY   Evolocumab  (REPATHA  SURECLICK) 140 MG/ML SOAJ Inject 140 mg into the skin every 14 (fourteen) days.   ezetimibe  (ZETIA ) 10 MG tablet Take 1 tablet (10 mg total) by mouth daily.   icosapent  Ethyl (VASCEPA ) 1 g capsule TAKE TWO CAPSULES (2 GRAM TOTAL) BY MOUTH TWO TIMES DAILY.   losartan  (COZAAR ) 25 MG tablet Take 1 tablet (25 mg  total) by mouth daily.   pantoprazole  (PROTONIX ) 40 MG tablet TAKE ONE TABLET BY MOUTH ONCE A DAY   spironolactone  (ALDACTONE ) 25 MG tablet TAKE ONE TABLET BY MOUTH TWO TIMES DAILY   SYNTHROID  112 MCG tablet Take 112 mcg by mouth daily.    Allergies:   Cefaclor, Penicillins, and Talwin [pentazocine]   Social History   Socioeconomic History   Marital status: Married    Spouse name: Not on file   Number of children: 2   Years of education: Not on file   Highest education level: Not on file  Occupational History   Occupation: Retired  Tobacco Use   Smoking status: Former    Current packs/day: 0.00    Average packs/day: 1 pack/day for 30.0 years (30.0 ttl pk-yrs)    Types: Cigarettes    Start date: 07/06/1968    Quit date: 07/06/1998    Years since quitting: 25.8   Smokeless tobacco: Never  Vaping Use   Vaping status: Never Used  Substance and Sexual Activity   Alcohol use: No    Alcohol/week: 0.0 standard drinks of alcohol   Drug use: No   Sexual activity: Not on file  Other Topics Concern   Not on file  Social History Narrative   Not on file   Social Drivers of Health   Financial Resource Strain: Low Risk  (12/24/2020)   Overall Financial Resource Strain (CARDIA)    Difficulty of Paying Living Expenses: Not hard at all  Food Insecurity: Not on file  Transportation Needs: Not on file  Physical Activity: Not on file  Stress: Not on file  Social Connections: Not on file     Family History:  The patient's family history includes Breast cancer in her cousin; Colon cancer in her maternal aunt and maternal uncle; Colon polyps in her maternal aunt and maternal uncle; Esophageal cancer in her father; Heart disease in her maternal uncle; Prostate cancer in her maternal uncle; Stomach cancer in her maternal uncle; Throat cancer in her father. There is no history of Diabetes, Kidney disease, Gallbladder disease, or Rectal cancer.  ROS:   12-point review of systems is negative  unless otherwise noted in the HPI.   EKGs/Labs/Other Studies Reviewed:    Studies reviewed were summarized above. The additional studies were reviewed today:  Lexiscan  MPI 12/04/2022:   The study is normal. The study is low risk.   No ST deviation was noted.   LV  perfusion is normal. There is no evidence of ischemia. There is no evidence of infarction.   Left ventricular function is normal. Nuclear stress EF: 71 %. The left ventricular ejection fraction is hyperdynamic (>65%). End diastolic cavity size is normal. End systolic cavity size is normal.   CT attenuation images shows mild aortic calcifications.   Abnormal baseline EKG with deeply inverted T waves in the inferior leads. __________   Renal artery ultrasound 10/10/2021: Renal:    Right: Normal size right kidney. Normal right Resisitive Index.         Normal cortical thickness of right kidney. RRV flow present.         No evidence of right renal artery stenosis.  Left:  LRV flow present. No evidence of left renal artery stenosis.         Normal size of left kidney. Normal left Resistive Index.         Normal cortical thickness of the left kidney.  Mesenteric:  Normal Celiac artery and Superior Mesenteric artery findings.  __________   2D echo 09/30/2021: 1. Left ventricular ejection fraction, by estimation, is 60 to 65%. The  left ventricle has normal function. The left ventricle has no regional  wall motion abnormalities. Left ventricular diastolic parameters are  consistent with Grade I diastolic  dysfunction (impaired relaxation).   2. Right ventricular systolic function is normal. The right ventricular  size is normal.   3. The mitral valve is normal in structure. Mild mitral valve  regurgitation. No evidence of mitral stenosis.   4. The aortic valve is tricuspid. Aortic valve regurgitation is not  visualized. No aortic stenosis is present.   5. The inferior vena cava is normal in size with greater than 50%   respiratory variability, suggesting right atrial pressure of 3 mmHg. __________   Lexiscan  MPI 12/19/2014: Pharmacological myocardial perfusison imaging study with no significnat ischemia. The left ventricular ejection fraction is hyperdynamic (>65%). No wall motion abnormality noted. No EKG changes concerning for ischemia. This is a low risk study.   EKG:  EKG is ordered today.  The EKG ordered today demonstrates NSR, 69 bpm, no acute ST-T changes.  Recent Labs: 12/01/2023: ALT 24; BUN 21; Creatinine, Ser 0.97; Potassium 4.3; Sodium 141 01/11/2024: TSH 0.114  Recent Lipid Panel    Component Value Date/Time   CHOL 185 12/01/2023 0946   TRIG 162 (H) 12/01/2023 0946   HDL 46 12/01/2023 0946   CHOLHDL 4.0 12/01/2023 0946   LDLCALC 111 (H) 12/01/2023 0946    PHYSICAL EXAM:    VS:  BP 130/80 (BP Location: Left Arm, Patient Position: Sitting, Cuff Size: Normal)   Pulse 69 Comment: 86 oximeter  Ht 5' 6 (1.676 m)   Wt 164 lb (74.4 kg)   SpO2 98%   BMI 26.47 kg/m   BMI: Body mass index is 26.47 kg/m.  Physical Exam Vitals reviewed.  Constitutional:      Appearance: She is well-developed.  HENT:     Head: Normocephalic and atraumatic.  Eyes:     General:        Right eye: No discharge.        Left eye: No discharge.  Cardiovascular:     Rate and Rhythm: Normal rate and regular rhythm.     Pulses:          Posterior tibial pulses are 2+ on the right side and 2+ on the left side.     Heart sounds: Normal heart sounds, S1 normal  and S2 normal. Heart sounds not distant. No midsystolic click and no opening snap. No murmur heard.    No friction rub.  Pulmonary:     Effort: Pulmonary effort is normal. No respiratory distress.     Breath sounds: Normal breath sounds. No decreased breath sounds, wheezing, rhonchi or rales.  Musculoskeletal:     Cervical back: Normal range of motion.     Right lower leg: No edema.     Left lower leg: No edema.  Skin:    General: Skin is warm  and dry.     Nails: There is no clubbing.  Neurological:     Mental Status: She is alert and oriented to person, place, and time.  Psychiatric:        Speech: Speech normal.        Behavior: Behavior normal.        Thought Content: Thought content normal.        Judgment: Judgment normal.     Wt Readings from Last 3 Encounters:  05/09/24 164 lb (74.4 kg)  12/06/23 163 lb 12.8 oz (74.3 kg)  12/01/23 165 lb 9.6 oz (75.1 kg)     ASSESSMENT & PLAN:   Coronary artery calcification/aortic atherosclerosis/HLD and hypertriglyceridemia with statin intolerance: No symptoms suggestive of angina or cardiac decompensation.  LDL 111 with a triglyceride 162 in 11/2023.  Continue aggressive risk factor modification and primary prevention including aspirin 81 mg, Vascepa  140 mg injected every 14 days subcutaneously, Vascepa  2 g twice daily, and ezetimibe  10 mg.  She will get updated fasting labs through her PCPs office later this month and notify us  when those are available for review with further recommendations regarding management of hypertriglyceridemia and hyperlipidemia pending results.  No indication for further ischemic cardiac testing at this time.  HTN: Blood pressure is recently controlled in the office today.  She remains on chlorthalidone  25 mg, losartan  25 mg, and spironolactone  25 mg twice daily.  Most recent labs showed stable renal function and electrolytes with follow-up labs pending later this month.      Disposition: F/u with Dr. Darron or an APP in 6 months.   Medication Adjustments/Labs and Tests Ordered: Current medicines are reviewed at length with the patient today.  Concerns regarding medicines are outlined above. Medication changes, Labs and Tests ordered today are summarized above and listed in the Patient Instructions accessible in Encounters.   Signed, Bernardino Bring, PA-C 05/09/2024 3:21 PM     [facility] [place] [address] [place],  [place] [address] [phone]

## 2024-05-16 ENCOUNTER — Telehealth: Payer: Self-pay

## 2024-05-16 DIAGNOSIS — I1A0 Resistant hypertension: Secondary | ICD-10-CM

## 2024-05-16 DIAGNOSIS — E538 Deficiency of other specified B group vitamins: Secondary | ICD-10-CM

## 2024-05-16 DIAGNOSIS — E782 Mixed hyperlipidemia: Secondary | ICD-10-CM

## 2024-05-16 DIAGNOSIS — E039 Hypothyroidism, unspecified: Secondary | ICD-10-CM

## 2024-05-16 DIAGNOSIS — I700 Atherosclerosis of aorta: Secondary | ICD-10-CM

## 2024-05-16 DIAGNOSIS — I10 Essential (primary) hypertension: Secondary | ICD-10-CM

## 2024-05-16 DIAGNOSIS — R7303 Prediabetes: Secondary | ICD-10-CM

## 2024-05-16 DIAGNOSIS — E559 Vitamin D deficiency, unspecified: Secondary | ICD-10-CM

## 2024-05-16 NOTE — Telephone Encounter (Signed)
 Patient notified

## 2024-05-19 DIAGNOSIS — I1A0 Resistant hypertension: Secondary | ICD-10-CM | POA: Diagnosis not present

## 2024-05-19 DIAGNOSIS — I10 Essential (primary) hypertension: Secondary | ICD-10-CM | POA: Diagnosis not present

## 2024-05-19 DIAGNOSIS — E538 Deficiency of other specified B group vitamins: Secondary | ICD-10-CM | POA: Diagnosis not present

## 2024-05-19 DIAGNOSIS — E039 Hypothyroidism, unspecified: Secondary | ICD-10-CM | POA: Diagnosis not present

## 2024-05-19 DIAGNOSIS — R7303 Prediabetes: Secondary | ICD-10-CM | POA: Diagnosis not present

## 2024-05-19 DIAGNOSIS — E782 Mixed hyperlipidemia: Secondary | ICD-10-CM | POA: Diagnosis not present

## 2024-05-19 DIAGNOSIS — E559 Vitamin D deficiency, unspecified: Secondary | ICD-10-CM | POA: Diagnosis not present

## 2024-05-20 LAB — CMP14+EGFR
ALT: 28 IU/L (ref 0–32)
AST: 23 IU/L (ref 0–40)
Albumin: 4.6 g/dL (ref 3.8–4.8)
Alkaline Phosphatase: 63 IU/L (ref 49–135)
BUN/Creatinine Ratio: 21 (ref 12–28)
BUN: 22 mg/dL (ref 8–27)
Bilirubin Total: 1.6 mg/dL — ABNORMAL HIGH (ref 0.0–1.2)
CO2: 25 mmol/L (ref 20–29)
Calcium: 9.9 mg/dL (ref 8.7–10.3)
Chloride: 96 mmol/L (ref 96–106)
Creatinine, Ser: 1.04 mg/dL — ABNORMAL HIGH (ref 0.57–1.00)
Globulin, Total: 2.6 g/dL (ref 1.5–4.5)
Glucose: 155 mg/dL — ABNORMAL HIGH (ref 70–99)
Potassium: 4.3 mmol/L (ref 3.5–5.2)
Sodium: 136 mmol/L (ref 134–144)
Total Protein: 7.2 g/dL (ref 6.0–8.5)
eGFR: 56 mL/min/1.73 — ABNORMAL LOW (ref >59)

## 2024-05-20 LAB — CBC WITH DIFFERENTIAL/PLATELET
Basophils Absolute: 0.1 x10E3/uL (ref 0.0–0.2)
Basos: 1 %
EOS (ABSOLUTE): 0.1 x10E3/uL (ref 0.0–0.4)
Eos: 2 %
Hematocrit: 51.6 % — ABNORMAL HIGH (ref 34.0–46.6)
Hemoglobin: 17 g/dL — ABNORMAL HIGH (ref 11.1–15.9)
Immature Grans (Abs): 0 x10E3/uL (ref 0.0–0.1)
Immature Granulocytes: 0 %
Lymphocytes Absolute: 1.6 x10E3/uL (ref 0.7–3.1)
Lymphs: 27 %
MCH: 30.1 pg (ref 26.6–33.0)
MCHC: 32.9 g/dL (ref 31.5–35.7)
MCV: 91 fL (ref 79–97)
Monocytes Absolute: 0.6 x10E3/uL (ref 0.1–0.9)
Monocytes: 10 %
Neutrophils Absolute: 3.4 x10E3/uL (ref 1.4–7.0)
Neutrophils: 59 %
Platelets: 274 x10E3/uL (ref 150–450)
RBC: 5.65 x10E6/uL — ABNORMAL HIGH (ref 3.77–5.28)
RDW: 12.1 % (ref 11.7–15.4)
WBC: 5.7 x10E3/uL (ref 3.4–10.8)

## 2024-05-20 LAB — LIPID PANEL
Chol/HDL Ratio: 3.6 ratio (ref 0.0–4.4)
Cholesterol, Total: 166 mg/dL (ref 100–199)
HDL: 46 mg/dL (ref >39)
LDL Chol Calc (NIH): 85 mg/dL (ref 0–99)
Triglycerides: 209 mg/dL — ABNORMAL HIGH (ref 0–149)
VLDL Cholesterol Cal: 35 mg/dL (ref 5–40)

## 2024-05-20 LAB — VITAMIN D 25 HYDROXY (VIT D DEFICIENCY, FRACTURES): Vit D, 25-Hydroxy: 29.7 ng/mL — ABNORMAL LOW (ref 30.0–100.0)

## 2024-05-20 LAB — TSH+FREE T4
Free T4: 1.79 ng/dL — ABNORMAL HIGH (ref 0.82–1.77)
TSH: 1.12 u[IU]/mL (ref 0.450–4.500)

## 2024-05-20 LAB — B12 AND FOLATE PANEL
Folate: 13.1 ng/mL (ref >3.0)
Vitamin B-12: 305 pg/mL (ref 232–1245)

## 2024-05-20 LAB — HGB A1C W/O EAG: Hgb A1c MFr Bld: 6.9 % — ABNORMAL HIGH (ref 4.8–5.6)

## 2024-05-23 ENCOUNTER — Other Ambulatory Visit: Payer: Self-pay

## 2024-05-23 ENCOUNTER — Other Ambulatory Visit: Payer: Self-pay | Admitting: Physician Assistant

## 2024-05-23 ENCOUNTER — Ambulatory Visit: Payer: Self-pay | Admitting: Nurse Practitioner

## 2024-05-23 DIAGNOSIS — I700 Atherosclerosis of aorta: Secondary | ICD-10-CM

## 2024-05-23 DIAGNOSIS — E785 Hyperlipidemia, unspecified: Secondary | ICD-10-CM

## 2024-05-23 DIAGNOSIS — E781 Pure hyperglyceridemia: Secondary | ICD-10-CM

## 2024-05-23 NOTE — Progress Notes (Signed)
 We will discuss the results at her upcoming appt this week

## 2024-05-23 NOTE — Telephone Encounter (Signed)
 Labs reviewed. Total cholesterol improved and at goal.  Triglycerides remain mildly elevated at 209 with goal being less than 150.  HDL remains at goal.  LDL is improving at 85 with goal being less than 70.  Recommendations: -If she is agreeable, please have her schedule an appointment with the Pharm.D./lipid clinic in [place] to discuss further management of hyperlipidemia and hypertriglyceridemia in an effort to reduce her risk -Continue current doses of Vascepa , ezetimibe , and Repatha 

## 2024-05-24 ENCOUNTER — Other Ambulatory Visit: Payer: Self-pay

## 2024-05-24 MED FILL — Evolocumab Subcutaneous Soln Auto-Injector 140 MG/ML: SUBCUTANEOUS | 28 days supply | Qty: 2 | Fill #0 | Status: AC

## 2024-05-26 ENCOUNTER — Ambulatory Visit (INDEPENDENT_AMBULATORY_CARE_PROVIDER_SITE_OTHER): Admitting: Nurse Practitioner

## 2024-05-26 ENCOUNTER — Other Ambulatory Visit: Payer: Self-pay

## 2024-05-26 ENCOUNTER — Encounter: Payer: Self-pay | Admitting: Nurse Practitioner

## 2024-05-26 VITALS — BP 128/78 | HR 80 | Temp 96.2°F | Resp 16 | Ht 66.0 in | Wt 163.4 lb

## 2024-05-26 DIAGNOSIS — E559 Vitamin D deficiency, unspecified: Secondary | ICD-10-CM

## 2024-05-26 DIAGNOSIS — M8589 Other specified disorders of bone density and structure, multiple sites: Secondary | ICD-10-CM | POA: Diagnosis not present

## 2024-05-26 DIAGNOSIS — I700 Atherosclerosis of aorta: Secondary | ICD-10-CM

## 2024-05-26 DIAGNOSIS — E119 Type 2 diabetes mellitus without complications: Secondary | ICD-10-CM

## 2024-05-26 DIAGNOSIS — Z0001 Encounter for general adult medical examination with abnormal findings: Secondary | ICD-10-CM

## 2024-05-26 DIAGNOSIS — R17 Unspecified jaundice: Secondary | ICD-10-CM | POA: Diagnosis not present

## 2024-05-26 DIAGNOSIS — E039 Hypothyroidism, unspecified: Secondary | ICD-10-CM

## 2024-05-26 DIAGNOSIS — E538 Deficiency of other specified B group vitamins: Secondary | ICD-10-CM

## 2024-05-26 DIAGNOSIS — N1831 Chronic kidney disease, stage 3a: Secondary | ICD-10-CM | POA: Diagnosis not present

## 2024-05-26 DIAGNOSIS — E782 Mixed hyperlipidemia: Secondary | ICD-10-CM

## 2024-05-26 MED ORDER — CYANOCOBALAMIN 1000 MCG/ML IJ SOLN
1000.0000 ug | Freq: Once | INTRAMUSCULAR | Status: AC
  Administered 2024-05-26: 1000 ug via INTRAMUSCULAR

## 2024-05-26 NOTE — Patient Instructions (Signed)
 Start daily vitamin b12 1000 mcg daily.  Continue over the counter vitamin D  supplement 5000 units daily.

## 2024-05-26 NOTE — Progress Notes (Signed)
 [facility] [address] [place], [place] [address]  Internal MEDICINE  Office Visit Note  Patient Name: Toni [name]  [id number]  [id number]  Date of Service: 05/26/2024  Chief Complaint  Patient presents with   Diabetes   Gastroesophageal Reflux   Hyperlipidemia   Hypertension   Medicare Wellness    HPI Toni [name] presents for a medicare annual wellness visit.  Well-appearing 78 y.o. female with hypertension, high cholesterol, diabetes and hyperthyroidism.  Routine CRC screening: due in 2028 Routine mammogram: done on 02/02/2024 at [facility], bi-rads category 1 negative. DEXA scan: done in 2020, and did show osteopenia of the left femur, left hip and spine.  Pap smear: discontinued, aged out Eye exam: due now, patient reminded Foot exam: done  Labs: lab results discussed with the patient, also due for repeat labs  Elevated triglycerides but the rest of her cholesterol panel has improved. Sees cardiology for repatha  and icosapent  which is for hypertriglyceridemia Slightly elevated creatinine Slightly decrease eGFR 56 Elevated bilirubin 1.6 New or worsening pain: none  Other concerns: Diet controlled diabetes -- A1c is elevated at 6.9, but still stable.  Reports that she has a nephew who passed away from liver cancer.  Father had throat cancer History of NAFLD on prior CT abdomen     05/26/2024    9:45 AM 05/26/2023    2:03 PM 05/20/2022    3:10 PM  MMSE - Mini Mental State Exam  Orientation to time 5 5 5   Orientation to Place 5 5 5   Registration 3 3 3   Attention/ Calculation 5 5 5   Recall 3 3 3   Language- name 2 objects 2 2 2   Language- repeat 1 1 1   Language- follow 3 step command 3 3 3   Language- read & follow direction 1 1 1   Write a sentence 1 1 1   Copy design 1 1 1   Total score 30 30 30     Functional Status Survey: Is the patient deaf or have difficulty hearing?: No Does the patient have difficulty seeing, even when wearing  glasses/contacts?: Yes Does the patient have difficulty concentrating, remembering, or making decisions?: No Does the patient have difficulty walking or climbing stairs?: No Does the patient have difficulty dressing or bathing?: No Does the patient have difficulty doing errands alone such as visiting a doctor's office or shopping?: No     03/10/2022    8:45 AM 05/20/2022    3:07 PM 10/05/2022    8:45 AM 05/26/2023    2:02 PM 05/26/2024    9:44 AM  Fall Risk  Falls in the past year? 0 0 0 0 0  Was there an injury with Fall?  0 0 0 0  Fall Risk Category Calculator  0 0 0 0  Fall Risk Category (Retired)  Low      (RETIRED) Patient Fall Risk Level  Low fall risk      Patient at Risk for Falls Due to   No Fall Risks No Fall Risks   Fall risk Follow up  Falls evaluation completed  Falls evaluation completed Falls evaluation completed Falls evaluation completed     Data saved with a previous flowsheet row definition       05/26/2024    9:44 AM  Depression screen PHQ 2/9  Decreased Interest 0  Down, Depressed, Hopeless 0  PHQ - 2 Score 0       Current Medication: Outpatient Encounter Medications as of 05/26/2024  Medication Sig   aspirin  81 MG tablet Take 81 mg by mouth daily.   Calcium Carbonate-Vitamin D  (CALCIUM + D PO) Take 1 tablet by mouth daily.  (Patient not taking: Reported on 05/26/2024)   cetirizine (ZYRTEC) 10 MG tablet Take 10 mg by mouth as needed.    chlorthalidone  (HYGROTON ) 25 MG tablet TAKE ONE TABLET BY MOUTH ONCE A DAY   ezetimibe  (ZETIA ) 10 MG tablet Take 1 tablet (10 mg total) by mouth daily.   icosapent  Ethyl (VASCEPA ) 1 g capsule TAKE TWO CAPSULES (2 GRAM TOTAL) BY MOUTH TWO TIMES DAILY.   losartan  (COZAAR ) 25 MG tablet Take 1 tablet (25 mg total) by mouth daily.   pantoprazole  (PROTONIX ) 40 MG tablet TAKE ONE TABLET BY MOUTH ONCE A DAY   Evolocumab  (REPATHA  SURECLICK) 140 MG/ML SOAJ Inject 140 mg into the skin every 14 (fourteen) days.   spironolactone   (ALDACTONE ) 25 MG tablet TAKE ONE TABLET BY MOUTH TWO TIMES DAILY   SYNTHROID  112 MCG tablet Take 112 mcg by mouth daily.   [EXPIRED] cyanocobalamin  (VITAMIN B12) injection 1,000 mcg    No facility-administered encounter medications on file as of 05/26/2024.    Surgical History: Past Surgical History:  Procedure Laterality Date   APPENDECTOMY     BUNIONECTOMY     right foot   CARDIAC CATHETERIZATION  1994   [facility]    CATARACT EXTRACTION, BILATERAL Left 10/20/2017   CATARACT EXTRACTION, BILATERAL Right 10/27/2017   CHOLECYSTECTOMY     COLONOSCOPY  05/25/2016   Dr.Stark   COLONOSCOPY     March 2023   HAMMER TOE SURGERY  12/2012   left toe   PARTIAL HYSTERECTOMY     POLYPECTOMY     ROBOTIC ASSISTED SALPINGO OOPHERECTOMY     right ovary first removed then left ovary removed years later   TONSILLECTOMY  age 10   TOTAL THYROIDECTOMY  09/2011    Medical History: Past Medical History:  Diagnosis Date   Allergic rhinitis    Allergy    Aortic atherosclerosis    Diabetes mellitus, type 2 (HCC)    no medicines- was pre diabtes- diet controlled    Diverticulitis    Essential hypertension    GERD (gastroesophageal reflux disease)    Hyperlipidemia    Hypertension    Hypothyroidism    Obesity    Osteoporosis    Sleep apnea    mild -no cpap   Transient insomnia    Tubular adenoma of colon 05/2013    Family History: Family History  Problem Relation Age of Onset   Throat cancer Father    Esophageal cancer Father    Colon cancer Maternal Aunt    Colon polyps Maternal Aunt    Colon cancer Maternal Uncle    Colon polyps Maternal Uncle    Heart disease Maternal Uncle    Stomach cancer Maternal Uncle    Prostate cancer Maternal Uncle    Breast cancer Cousin    Diabetes Neg Hx    Kidney disease Neg Hx    Gallbladder disease Neg Hx    Rectal cancer Neg Hx     Social History   Socioeconomic History   Marital status: Married    Spouse name: Not on file    Number of children: 2   Years of education: Not on file   Highest education level: Not on file  Occupational History   Occupation: Retired  Tobacco Use   Smoking status: Former    Current packs/day: 0.00    Average packs/day:  1 pack/day for 30.0 years (30.0 ttl pk-yrs)    Types: Cigarettes    Start date: 07/06/1968    Quit date: 07/06/1998    Years since quitting: 25.9   Smokeless tobacco: Never  Vaping Use   Vaping status: Never Used  Substance and Sexual Activity   Alcohol use: No    Alcohol/week: 0.0 standard drinks of alcohol   Drug use: No   Sexual activity: Not on file  Other Topics Concern   Not on file  Social History Narrative   Not on file   Social Drivers of Health   Financial Resource Strain: Low Risk  (12/24/2020)   Overall Financial Resource Strain (CARDIA)    Difficulty of Paying Living Expenses: Not hard at all  Food Insecurity: Not on file  Transportation Needs: Not on file  Physical Activity: Not on file  Stress: Not on file  Social Connections: Not on file  Intimate Partner Violence: Not on file      Review of Systems  Constitutional:  Negative for activity change, appetite change, chills, fatigue, fever and unexpected weight change.  HENT: Negative.  Negative for congestion, ear pain, rhinorrhea, sore throat and trouble swallowing.   Eyes: Negative.   Respiratory: Negative.  Negative for cough, chest tightness, shortness of breath and wheezing.   Cardiovascular: Negative.  Negative for chest pain.  Gastrointestinal: Negative.  Negative for abdominal pain, blood in stool, constipation, diarrhea, nausea and vomiting.  Endocrine: Negative.   Genitourinary: Negative.  Negative for difficulty urinating, dysuria, frequency, hematuria and urgency.  Musculoskeletal:  Positive for arthralgias. Negative for back pain, joint swelling, myalgias and neck pain.  Skin: Negative.  Negative for rash and wound.  Allergic/Immunologic: Negative.  Negative for  immunocompromised state.  Neurological: Negative.  Negative for dizziness, seizures, numbness and headaches.  Hematological: Negative.   Psychiatric/Behavioral: Negative.  Negative for behavioral problems, self-injury and suicidal ideas. The patient is not nervous/anxious.     Vital Signs: BP 128/78   Pulse 80   Temp (!) 96.2 F (35.7 C)   Resp 16   Ht 5' 6 (1.676 m)   Wt 163 lb 6.4 oz (74.1 kg)   SpO2 97%   BMI 26.37 kg/m    Physical Exam Vitals reviewed.  Constitutional:      General: She is awake. She is not in acute distress.    Appearance: Normal appearance. She is well-developed, well-groomed and normal weight. She is not ill-appearing or diaphoretic.  HENT:     Head: Normocephalic and atraumatic.     Mouth/Throat:     Lips: Pink.     Pharynx: Uvula midline.  Eyes:     General: Lids are normal. Vision grossly intact. Gaze aligned appropriately.     Extraocular Movements: Extraocular movements intact.     Conjunctiva/sclera: Conjunctivae normal.     Pupils: Pupils are equal, round, and reactive to light.     Funduscopic exam:    Right eye: Red reflex present.        Left eye: Red reflex present. Neck:     Thyroid : No thyromegaly.     Vascular: No JVD.     Trachea: Trachea and phonation normal. No tracheal deviation.  Cardiovascular:     Rate and Rhythm: Normal rate and regular rhythm.     Pulses: Normal pulses.     Heart sounds: Normal heart sounds, S1 normal and S2 normal. No murmur heard.    No friction rub. No gallop.  Pulmonary:  Effort: Pulmonary effort is normal. No accessory muscle usage or respiratory distress.     Breath sounds: Normal breath sounds and air entry. No stridor. No wheezing or rales.  Chest:     Chest wall: No tenderness.     Comments: Declines clinical breast exam, gets annual mammograms.  Abdominal:     Palpations: There is no shifting dullness, fluid wave or pulsatile mass.  Skin:    Capillary Refill: Capillary refill takes  less than 2 seconds.  Neurological:     Mental Status: She is alert and oriented to person, place, and time.     Cranial Nerves: No cranial nerve deficit.     Motor: No abnormal muscle tone.     Coordination: Coordination normal.     Deep Tendon Reflexes: Reflexes are normal and symmetric.  Psychiatric:        Mood and Affect: Mood normal.        Behavior: Behavior normal. Behavior is cooperative.        Assessment/Plan: 1. Encounter for Medicare annual examination with abnormal findings (Primary) Age-appropriate preventive screenings and vaccinations discussed. Routine labs for health maintenance results discussed with patient and repeat labs ordered, see below. PHM updated.   - CBC with Differential/Platelet - Hepatic function panel - Basic Metabolic Panel (BMET) - TSH + free T4  2. Diet-controlled type 2 diabetes mellitus (HCC) Repeat labs ordered. Continue diabetic diet. A1c is stable at 6.9. increase physical activity as tolerated.  - CBC with Differential/Platelet - Hepatic function panel - Basic Metabolic Panel (BMET) - TSH + free T4 - Urine Microalbumin w/creat. ratio  3. Stage 3a chronic kidney disease (CKD) (HCC) Repeat labs ordered  - CBC with Differential/Platelet - Hepatic function panel - Basic Metabolic Panel (BMET) - TSH + free T4  4. Aortic atherosclerosis Continue medications as prescribed by cardiology. Follow up with cardiology as scheduled.   5. Acquired hypothyroidism Repeat labs ordered  - CBC with Differential/Platelet - Hepatic function panel - Basic Metabolic Panel (BMET) - TSH + free T4  6. Mixed hyperlipidemia Managed by cardiology, on repatha  and, icosapent , and nexlizet .   7. Osteopenia of multiple sites Consider repeat dexa scan since the last one was done in 2020  8. Elevated bilirubin Repeat labs ordered  - CBC with Differential/Platelet - Hepatic function panel - Basic Metabolic Panel (BMET) - TSH + free T4  9. B12  deficiency B12 injection administered during office visit today.  - cyanocobalamin  (VITAMIN B12) injection 1,000 mcg  10. Vitamin D  deficiency Continue Otc vitamin D  supplement.     General Counseling: addalyn speedy understanding of the findings of todays visit and agrees with plan of treatment. I have discussed any further diagnostic evaluation that may be needed or ordered today. We also reviewed her medications today. she has been encouraged to call the office with any questions or concerns that should arise related to todays visit.    Orders Placed This Encounter  Procedures   CBC with Differential/Platelet   Hepatic function panel   Basic Metabolic Panel (BMET)   TSH + free T4    Meds ordered this encounter  Medications   cyanocobalamin  (VITAMIN B12) injection 1,000 mcg    Return in about 4 weeks (around 06/23/2024) for F/U, Labs, Heiley Shaikh PCP to discuss repeat labs .   Total time spent:30 Minutes Time spent includes review of chart, medications, test results, and follow up plan with the patient.   [place] Controlled Substance Database was reviewed by me.  This patient was seen by Mardy Maxin, FNP-C in collaboration with Dr. Sigrid Bathe as a part of collaborative care agreement.  Breezie Micucci R. Maxin, MSN, FNP-C Internal medicine

## 2024-05-27 LAB — MICROALBUMIN / CREATININE URINE RATIO
Creatinine, Urine: 100.7 mg/dL
Microalb/Creat Ratio: 7 mg/g{creat} (ref 0–29)
Microalbumin, Urine: 6.9 ug/mL

## 2024-05-29 ENCOUNTER — Other Ambulatory Visit: Payer: Self-pay | Admitting: Emergency Medicine

## 2024-05-29 DIAGNOSIS — Z79899 Other long term (current) drug therapy: Secondary | ICD-10-CM

## 2024-05-29 DIAGNOSIS — E785 Hyperlipidemia, unspecified: Secondary | ICD-10-CM

## 2024-05-30 ENCOUNTER — Ambulatory Visit: Payer: PPO | Admitting: Nurse Practitioner

## 2024-05-30 NOTE — Progress Notes (Signed)
   05/30/2024  Patient ID: Orie LELON Glatter, female   DOB: [birth year], 78 y.o.   MRN: [id number]  Pharmacy Quality Measure Review  This patient is appearing on a report for being at risk of failing the adherence measure for hypertension (ACEi/ARB) medications this calendar year.   Medication: Losartan  Last fill date: 05/23/24 for 30 day supply  Insurance report was not up to date. No action needed at this time.   Jon VEAR Lindau, PharmD Clinical Pharmacist [phone]

## 2024-06-21 NOTE — Progress Notes (Signed)
 Toni [name]                                          MRN: [id number]   06/21/2024   The [facility] Quality Team Specialist reviewed this patient medical record for the purposes of chart review for care gap closure. The following were reviewed: abstraction for care gap closure-kidney health evaluation for diabetes:eGFR  and uACR.    [facility]BCI Quality Team

## 2024-06-22 LAB — HEPATIC FUNCTION PANEL
ALT: 26 IU/L (ref 0–32)
AST: 23 IU/L (ref 0–40)
Albumin: 4.8 g/dL (ref 3.8–4.8)
Alkaline Phosphatase: 72 IU/L (ref 49–135)
Bilirubin Total: 1.2 mg/dL (ref 0.0–1.2)
Bilirubin, Direct: 0.3 mg/dL (ref 0.00–0.40)
Total Protein: 7 g/dL (ref 6.0–8.5)

## 2024-06-22 LAB — BASIC METABOLIC PANEL WITH GFR
BUN/Creatinine Ratio: 16 (ref 12–28)
BUN: 15 mg/dL (ref 8–27)
CO2: 26 mmol/L (ref 20–29)
Calcium: 10.3 mg/dL (ref 8.7–10.3)
Chloride: 97 mmol/L (ref 96–106)
Creatinine, Ser: 0.95 mg/dL (ref 0.57–1.00)
Glucose: 158 mg/dL — ABNORMAL HIGH (ref 70–99)
Potassium: 4.2 mmol/L (ref 3.5–5.2)
Sodium: 141 mmol/L (ref 134–144)
eGFR: 62 mL/min/1.73 (ref >59)

## 2024-06-22 LAB — CBC WITH DIFFERENTIAL/PLATELET
Basophils Absolute: 0.1 x10E3/uL (ref 0.0–0.2)
Basos: 1 %
EOS (ABSOLUTE): 0.1 x10E3/uL (ref 0.0–0.4)
Eos: 2 %
Hematocrit: 48.9 % — ABNORMAL HIGH (ref 34.0–46.6)
Hemoglobin: 16.6 g/dL — ABNORMAL HIGH (ref 11.1–15.9)
Immature Grans (Abs): 0 x10E3/uL (ref 0.0–0.1)
Immature Granulocytes: 0 %
Lymphocytes Absolute: 1.4 x10E3/uL (ref 0.7–3.1)
Lymphs: 27 %
MCH: 30.8 pg (ref 26.6–33.0)
MCHC: 33.9 g/dL (ref 31.5–35.7)
MCV: 91 fL (ref 79–97)
Monocytes Absolute: 0.5 x10E3/uL (ref 0.1–0.9)
Monocytes: 10 %
Neutrophils Absolute: 3.1 x10E3/uL (ref 1.4–7.0)
Neutrophils: 60 %
Platelets: 262 x10E3/uL (ref 150–450)
RBC: 5.39 x10E6/uL — ABNORMAL HIGH (ref 3.77–5.28)
RDW: 12.3 % (ref 11.7–15.4)
WBC: 5.3 x10E3/uL (ref 3.4–10.8)

## 2024-06-22 LAB — TSH+FREE T4
Free T4: 1.78 ng/dL — ABNORMAL HIGH (ref 0.82–1.77)
TSH: 0.838 u[IU]/mL (ref 0.450–4.500)

## 2024-06-26 ENCOUNTER — Other Ambulatory Visit: Payer: Self-pay

## 2024-06-26 MED FILL — Evolocumab Subcutaneous Soln Auto-Injector 140 MG/ML: SUBCUTANEOUS | 28 days supply | Qty: 2 | Fill #1 | Status: AC

## 2024-06-27 ENCOUNTER — Ambulatory Visit (INDEPENDENT_AMBULATORY_CARE_PROVIDER_SITE_OTHER): Admitting: Nurse Practitioner

## 2024-06-27 ENCOUNTER — Encounter: Payer: Self-pay | Admitting: Nurse Practitioner

## 2024-06-27 VITALS — BP 136/74 | HR 92 | Temp 96.6°F | Resp 16 | Ht 66.0 in | Wt 160.4 lb

## 2024-06-27 DIAGNOSIS — Z85850 Personal history of malignant neoplasm of thyroid: Secondary | ICD-10-CM

## 2024-06-27 DIAGNOSIS — E039 Hypothyroidism, unspecified: Secondary | ICD-10-CM | POA: Diagnosis not present

## 2024-06-27 DIAGNOSIS — E782 Mixed hyperlipidemia: Secondary | ICD-10-CM | POA: Diagnosis not present

## 2024-06-27 DIAGNOSIS — D751 Secondary polycythemia: Secondary | ICD-10-CM | POA: Diagnosis not present

## 2024-06-27 NOTE — Progress Notes (Signed)
 [facility] [address] [place], [place] [address]  Internal MEDICINE  Office Visit Note  Patient Name: Toni [name]  [id number]  [id number]  Date of Service: 06/27/2024  Chief Complaint  Patient presents with   Diabetes   Gastroesophageal Reflux   Hypertension   Hyperlipidemia   Follow-up    Review labs    HPI Danyia presents for a follow-up visit for lab results.  Elevated free T4 -- taking 112 mcg daily of synthroid , except for she takes a half tab 2 days a week.  Elevated bilirubin --improved to normal range.  Kidney function improved to normal.  Borderline elevated calcium  Erythrocytosis -- on last 2 labs    Current Medication: Outpatient Encounter Medications as of 06/27/2024  Medication Sig   aspirin 81 MG tablet Take 81 mg by mouth daily.   Calcium Carbonate-Vitamin D  (CALCIUM + D PO) Take 1 tablet by mouth daily.  (Patient not taking: Reported on 05/26/2024)   cetirizine (ZYRTEC) 10 MG tablet Take 10 mg by mouth as needed.    chlorthalidone  (HYGROTON ) 25 MG tablet TAKE ONE TABLET BY MOUTH ONCE A DAY   ezetimibe  (ZETIA ) 10 MG tablet Take 1 tablet (10 mg total) by mouth daily.   icosapent  Ethyl (VASCEPA ) 1 g capsule TAKE TWO CAPSULES (2 GRAM TOTAL) BY MOUTH TWO TIMES DAILY.   losartan  (COZAAR ) 25 MG tablet Take 1 tablet (25 mg total) by mouth daily.   pantoprazole  (PROTONIX ) 40 MG tablet TAKE ONE TABLET BY MOUTH ONCE A DAY   Evolocumab  (REPATHA  SURECLICK) 140 MG/ML SOAJ Inject 140 mg into the skin every 14 (fourteen) days.   spironolactone  (ALDACTONE ) 25 MG tablet TAKE ONE TABLET BY MOUTH TWO TIMES DAILY   SYNTHROID  112 MCG tablet Take 112 mcg by mouth daily.   No facility-administered encounter medications on file as of 06/27/2024.    Surgical History: Past Surgical History:  Procedure Laterality Date   APPENDECTOMY     BUNIONECTOMY     right foot   CARDIAC CATHETERIZATION  1994   [facility]    CATARACT EXTRACTION, BILATERAL Left  10/20/2017   CATARACT EXTRACTION, BILATERAL Right 10/27/2017   CHOLECYSTECTOMY     COLONOSCOPY  05/25/2016   Dr.Stark   COLONOSCOPY     March 2023   HAMMER TOE SURGERY  12/2012   left toe   PARTIAL HYSTERECTOMY     POLYPECTOMY     ROBOTIC ASSISTED SALPINGO OOPHERECTOMY     right ovary first removed then left ovary removed years later   TONSILLECTOMY  age 17   TOTAL THYROIDECTOMY  09/2011    Medical History: Past Medical History:  Diagnosis Date   Allergic rhinitis    Allergy    Aortic atherosclerosis    Diabetes mellitus, type 2 (HCC)    no medicines- was pre diabtes- diet controlled    Diverticulitis    Essential hypertension    GERD (gastroesophageal reflux disease)    Hyperlipidemia    Hypertension    Hypothyroidism    Obesity    Osteoporosis    Sleep apnea    mild -no cpap   Transient insomnia    Tubular adenoma of colon 05/2013    Family History: Family History  Problem Relation Age of Onset   Throat cancer Father    Esophageal cancer Father    Colon cancer Maternal Aunt    Colon polyps Maternal Aunt    Colon cancer Maternal Uncle    Colon polyps Maternal Uncle  Heart disease Maternal Uncle    Stomach cancer Maternal Uncle    Prostate cancer Maternal Uncle    Breast cancer Cousin    Diabetes Neg Hx    Kidney disease Neg Hx    Gallbladder disease Neg Hx    Rectal cancer Neg Hx     Social History   Socioeconomic History   Marital status: Married    Spouse name: Not on file   Number of children: 2   Years of education: Not on file   Highest education level: Not on file  Occupational History   Occupation: Retired  Tobacco Use   Smoking status: Former    Current packs/day: 0.00    Average packs/day: 1 pack/day for 30.0 years (30.0 ttl pk-yrs)    Types: Cigarettes    Start date: 07/06/1968    Quit date: 07/06/1998    Years since quitting: 25.9   Smokeless tobacco: Never  Vaping Use   Vaping status: Never Used  Substance and Sexual Activity    Alcohol use: No    Alcohol/week: 0.0 standard drinks of alcohol   Drug use: No   Sexual activity: Not on file  Other Topics Concern   Not on file  Social History Narrative   Not on file   Social Drivers of Health   Tobacco Use: Medium Risk (06/27/2024)   Patient History    Smoking Tobacco Use: Former    Smokeless Tobacco Use: Never    Passive Exposure: Not on Actuary Strain: Not on file  Food Insecurity: Not on file  Transportation Needs: Not on file  Physical Activity: Not on file  Stress: Not on file  Social Connections: Not on file  Intimate Partner Violence: Not on file  Depression (PHQ2-9): Low Risk (05/26/2024)   Depression (PHQ2-9)    PHQ-2 Score: 0  Alcohol Screen: Low Risk (12/08/2021)   Alcohol Screen    Last Alcohol Screening Score (AUDIT): 0  Housing: Unknown (01/25/2024)   Received from [facility]   Epic    Unable to Pay for Housing in the Last Year: Not on file    Number of Times Moved in the Last Year: Not on file    At any time in the past 12 months, were you homeless or living in a shelter (including now)?: No  Utilities: Not on file  Health Literacy: Not on file      Review of Systems  Constitutional:  Negative for activity change, appetite change, chills, fatigue, fever and unexpected weight change.  HENT: Negative.  Negative for congestion, ear pain, rhinorrhea, sore throat and trouble swallowing.   Eyes: Negative.   Respiratory:  Positive for cough (chronic). Negative for chest tightness, shortness of breath and wheezing.   Cardiovascular: Negative.  Negative for chest pain.  Gastrointestinal: Negative.  Negative for abdominal pain, blood in stool, constipation, diarrhea, nausea and vomiting.  Endocrine: Negative.   Genitourinary: Negative.  Negative for difficulty urinating, dysuria, frequency, hematuria and urgency.  Musculoskeletal:  Positive for arthralgias. Negative for back pain, joint swelling, myalgias and  neck pain.  Skin: Negative.  Negative for rash and wound.  Allergic/Immunologic: Negative.  Negative for immunocompromised state.  Neurological: Negative.  Negative for dizziness, seizures, numbness and headaches.  Hematological: Negative.   Psychiatric/Behavioral: Negative.  Negative for behavioral problems, self-injury and suicidal ideas. The patient is not nervous/anxious.     Vital Signs: BP 136/74   Pulse 92   Temp (!) 96.6 F (35.9 C)  Resp 16   Ht 5' 6 (1.676 m)   Wt 160 lb 6.4 oz (72.8 kg)   SpO2 96%   BMI 25.89 kg/m    Physical Exam Vitals reviewed.  Constitutional:      General: She is not in acute distress.    Appearance: Normal appearance. She is not ill-appearing.  HENT:     Head: Normocephalic and atraumatic.  Eyes:     Pupils: Pupils are equal, round, and reactive to light.  Cardiovascular:     Rate and Rhythm: Normal rate and regular rhythm.  Pulmonary:     Effort: Pulmonary effort is normal. No respiratory distress.  Neurological:     Mental Status: She is alert and oriented to person, place, and time.  Psychiatric:        Mood and Affect: Mood normal.        Behavior: Behavior normal.        Assessment/Plan: 1. Erythrocytosis (Primary) Iron panel and repeat CBC ordered.  - Iron, TIBC and Ferritin Panel - CBC with Differential/Platelet  2. Hypercalcemia Parathyroid labs ordered  - Ca+Creat+P+PTH Intact  3. Acquired hypothyroidism Additional labs ordered  - TSH+T4F+T3Free+ThyAbs+TPO+VD25  4. History of thyroid  cancer Additional labs ordered  - TSH+T4F+T3Free+ThyAbs+TPO+VD25  5. Mixed hyperlipidemia On multiple medications, managed by cardiology.    General Counseling: ciarrah rae understanding of the findings of todays visit and agrees with plan of treatment. I have discussed any further diagnostic evaluation that may be needed or ordered today. We also reviewed her medications today. she has been encouraged to call the office  with any questions or concerns that should arise related to todays visit.    Orders Placed This Encounter  Procedures   TSH+T4F+T3Free+ThyAbs+TPO+VD25   Ca+Creat+P+PTH Intact   Iron, TIBC and Ferritin Panel   CBC with Differential/Platelet    No orders of the defined types were placed in this encounter.   Return in about 3 weeks (around 07/18/2024) for F/U, Labs, Anistyn Graddy PCP.   Total time spent:30 Minutes Time spent includes review of chart, medications, test results, and follow up plan with the patient.   [place] Controlled Substance Database was reviewed by me.  This patient was seen by Mardy Maxin, FNP-C in collaboration with Dr. Sigrid Bathe as a part of collaborative care agreement.   Gustaf Mccarter R. Maxin, MSN, FNP-C Internal medicine

## 2024-07-03 ENCOUNTER — Encounter: Payer: Self-pay | Admitting: Nurse Practitioner

## 2024-07-14 LAB — CA+CREAT+P+PTH INTACT
Calcium: 9.8 mg/dL (ref 8.7–10.3)
Creatinine, Ser: 1.06 mg/dL — ABNORMAL HIGH (ref 0.57–1.00)
PTH: 36 pg/mL (ref 15–65)
Phosphorus: 3.8 mg/dL (ref 3.0–4.3)
eGFR: 54 mL/min/1.73 — ABNORMAL LOW

## 2024-07-14 LAB — CBC WITH DIFFERENTIAL/PLATELET
Basophils Absolute: 0.1 x10E3/uL (ref 0.0–0.2)
Basos: 1 %
EOS (ABSOLUTE): 0.1 x10E3/uL (ref 0.0–0.4)
Eos: 2 %
Hematocrit: 47.5 % — ABNORMAL HIGH (ref 34.0–46.6)
Hemoglobin: 16.2 g/dL — ABNORMAL HIGH (ref 11.1–15.9)
Immature Grans (Abs): 0 x10E3/uL (ref 0.0–0.1)
Immature Granulocytes: 0 %
Lymphocytes Absolute: 1.8 x10E3/uL (ref 0.7–3.1)
Lymphs: 28 %
MCH: 30.8 pg (ref 26.6–33.0)
MCHC: 34.1 g/dL (ref 31.5–35.7)
MCV: 90 fL (ref 79–97)
Monocytes Absolute: 0.6 x10E3/uL (ref 0.1–0.9)
Monocytes: 10 %
Neutrophils Absolute: 3.8 x10E3/uL (ref 1.4–7.0)
Neutrophils: 59 %
Platelets: 261 x10E3/uL (ref 150–450)
RBC: 5.26 x10E6/uL (ref 3.77–5.28)
RDW: 12.3 % (ref 11.7–15.4)
WBC: 6.4 x10E3/uL (ref 3.4–10.8)

## 2024-07-14 LAB — IRON,TIBC AND FERRITIN PANEL
Ferritin: 51 ng/mL (ref 15–150)
Iron Saturation: 19 % (ref 15–55)
Iron: 66 ug/dL (ref 27–139)
Total Iron Binding Capacity: 351 ug/dL (ref 250–450)
UIBC: 285 ug/dL (ref 118–369)

## 2024-07-14 LAB — TSH+T4F+T3FREE+THYABS+TPO+VD25
Free T4: 1.47 ng/dL (ref 0.82–1.77)
T3, Free: 2.3 pg/mL (ref 2.0–4.4)
TSH: 3.12 u[IU]/mL (ref 0.450–4.500)
Thyroglobulin Antibody: <1 [IU]/mL (ref 0.0–0.9)
Thyroperoxidase Ab SerPl-aCnc: 15 [IU]/mL (ref 0–34)
Vit D, 25-Hydroxy: 39.2 ng/mL (ref 30.0–100.0)

## 2024-07-19 ENCOUNTER — Ambulatory Visit (INDEPENDENT_AMBULATORY_CARE_PROVIDER_SITE_OTHER): Admitting: Nurse Practitioner

## 2024-07-19 ENCOUNTER — Encounter: Payer: Self-pay | Admitting: Nurse Practitioner

## 2024-07-19 VITALS — BP 136/78 | HR 80 | Temp 96.9°F | Resp 16 | Ht 66.0 in | Wt 165.2 lb

## 2024-07-19 DIAGNOSIS — N1831 Chronic kidney disease, stage 3a: Secondary | ICD-10-CM

## 2024-07-19 DIAGNOSIS — D751 Secondary polycythemia: Secondary | ICD-10-CM

## 2024-07-19 DIAGNOSIS — E039 Hypothyroidism, unspecified: Secondary | ICD-10-CM

## 2024-07-19 NOTE — Progress Notes (Signed)
 [facility] [address] [place], [place] [address]  Internal MEDICINE  Office Visit Note  Patient Name: Toni [name]  [id number]  [id number]  Date of Service: 07/19/2024  Chief Complaint  Patient presents with   Diabetes   Gastroesophageal Reflux   Hyperlipidemia   Hypertension   Follow-up    HPI Toni [name] presents for a follow-up visit for lab results.  Hypothyroidism -- feeling more tired since decreasing to 1/2 tablet 3 times weekly. Elevated hemoglobin and hematocrit x3 months Low vitamin D  is improved to normal range Calcium level is normal now and PTH was normal.     Current Medication: Outpatient Encounter Medications as of 07/19/2024  Medication Sig   aspirin 81 MG tablet Take 81 mg by mouth daily.   Calcium Carbonate-Vitamin D  (CALCIUM + D PO) Take 1 tablet by mouth daily.  (Patient not taking: Reported on 05/26/2024)   cetirizine (ZYRTEC) 10 MG tablet Take 10 mg by mouth as needed.    chlorthalidone  (HYGROTON ) 25 MG tablet TAKE ONE TABLET BY MOUTH ONCE A DAY   ezetimibe  (ZETIA ) 10 MG tablet Take 1 tablet (10 mg total) by mouth daily.   icosapent  Ethyl (VASCEPA ) 1 g capsule TAKE TWO CAPSULES (2 GRAM TOTAL) BY MOUTH TWO TIMES DAILY.   losartan  (COZAAR ) 25 MG tablet Take 1 tablet (25 mg total) by mouth daily.   pantoprazole  (PROTONIX ) 40 MG tablet TAKE ONE TABLET BY MOUTH ONCE A DAY   Evolocumab  (REPATHA  SURECLICK) 140 MG/ML SOAJ Inject 140 mg into the skin every 14 (fourteen) days.   spironolactone  (ALDACTONE ) 25 MG tablet TAKE ONE TABLET BY MOUTH TWO TIMES DAILY   SYNTHROID  112 MCG tablet Take 112 mcg by mouth daily.   No facility-administered encounter medications on file as of 07/19/2024.    Surgical History: Past Surgical History:  Procedure Laterality Date   APPENDECTOMY     BUNIONECTOMY     right foot   CARDIAC CATHETERIZATION  1994   [facility]    CATARACT EXTRACTION, BILATERAL Left 10/20/2017   CATARACT EXTRACTION, BILATERAL Right  10/27/2017   CHOLECYSTECTOMY     COLONOSCOPY  05/25/2016   Dr.Stark   COLONOSCOPY     March 2023   HAMMER TOE SURGERY  12/2012   left toe   PARTIAL HYSTERECTOMY     POLYPECTOMY     ROBOTIC ASSISTED SALPINGO OOPHERECTOMY     right ovary first removed then left ovary removed years later   TONSILLECTOMY  age 28   TOTAL THYROIDECTOMY  09/2011    Medical History: Past Medical History:  Diagnosis Date   Allergic rhinitis    Allergy    Aortic atherosclerosis    Diabetes mellitus, type 2 (HCC)    no medicines- was pre diabtes- diet controlled    Diverticulitis    Essential hypertension    GERD (gastroesophageal reflux disease)    Hyperlipidemia    Hypertension    Hypothyroidism    Obesity    Osteoporosis    Sleep apnea    mild -no cpap   Transient insomnia    Tubular adenoma of colon 05/2013    Family History: Family History  Problem Relation Age of Onset   Throat cancer Father    Esophageal cancer Father    Colon cancer Maternal Aunt    Colon polyps Maternal Aunt    Colon cancer Maternal Uncle    Colon polyps Maternal Uncle    Heart disease Maternal Uncle    Stomach cancer Maternal  Uncle    Prostate cancer Maternal Uncle    Breast cancer Cousin    Diabetes Neg Hx    Kidney disease Neg Hx    Gallbladder disease Neg Hx    Rectal cancer Neg Hx     Social History   Socioeconomic History   Marital status: Married    Spouse name: Not on file   Number of children: 2   Years of education: Not on file   Highest education level: Not on file  Occupational History   Occupation: Retired  Tobacco Use   Smoking status: Former    Current packs/day: 0.00    Average packs/day: 1 pack/day for 30.0 years (30.0 ttl pk-yrs)    Types: Cigarettes    Start date: 07/06/1968    Quit date: 07/06/1998    Years since quitting: 26.0   Smokeless tobacco: Never  Vaping Use   Vaping status: Never Used  Substance and Sexual Activity   Alcohol use: No    Alcohol/week: 0.0 standard  drinks of alcohol   Drug use: No   Sexual activity: Not on file  Other Topics Concern   Not on file  Social History Narrative   Not on file   Social Drivers of Health   Tobacco Use: Medium Risk (07/19/2024)   Patient History    Smoking Tobacco Use: Former    Smokeless Tobacco Use: Never    Passive Exposure: Not on Actuary Strain: Not on file  Food Insecurity: Not on file  Transportation Needs: Not on file  Physical Activity: Not on file  Stress: Not on file  Social Connections: Not on file  Intimate Partner Violence: Not on file  Depression (PHQ2-9): Low Risk (05/26/2024)   Depression (PHQ2-9)    PHQ-2 Score: 0  Alcohol Screen: Low Risk (12/08/2021)   Alcohol Screen    Last Alcohol Screening Score (AUDIT): 0  Housing: Unknown (01/25/2024)   Received from [facility]   Epic    Unable to Pay for Housing in the Last Year: Not on file    Number of Times Moved in the Last Year: Not on file    At any time in the past 12 months, were you homeless or living in a shelter (including now)?: No  Utilities: Not on file  Health Literacy: Not on file      Review of Systems  Constitutional:  Negative for activity change, appetite change, chills, fatigue, fever and unexpected weight change.  HENT: Negative.  Negative for congestion, ear pain, rhinorrhea, sore throat and trouble swallowing.   Eyes: Negative.   Respiratory:  Positive for cough (chronic). Negative for chest tightness, shortness of breath and wheezing.   Cardiovascular: Negative.  Negative for chest pain.  Gastrointestinal: Negative.  Negative for abdominal pain, blood in stool, constipation, diarrhea, nausea and vomiting.  Endocrine: Negative.   Genitourinary: Negative.  Negative for difficulty urinating, dysuria, frequency, hematuria and urgency.  Musculoskeletal:  Positive for arthralgias. Negative for back pain, joint swelling, myalgias and neck pain.  Skin: Negative.  Negative for rash  and wound.  Allergic/Immunologic: Negative.  Negative for immunocompromised state.  Neurological: Negative.  Negative for dizziness, seizures, numbness and headaches.  Hematological: Negative.   Psychiatric/Behavioral: Negative.  Negative for behavioral problems, self-injury and suicidal ideas. The patient is not nervous/anxious.     Vital Signs: BP 136/78   Pulse 80   Temp (!) 96.9 F (36.1 C)   Resp 16   Ht 5' 6 (1.676  m)   Wt 165 lb 3.2 oz (74.9 kg)   SpO2 97%   BMI 26.66 kg/m    Physical Exam Vitals reviewed.  Constitutional:      General: She is not in acute distress.    Appearance: Normal appearance. She is not ill-appearing.  HENT:     Head: Normocephalic and atraumatic.  Eyes:     Pupils: Pupils are equal, round, and reactive to light.  Cardiovascular:     Rate and Rhythm: Normal rate and regular rhythm.  Pulmonary:     Effort: Pulmonary effort is normal. No respiratory distress.  Neurological:     Mental Status: She is alert and oriented to person, place, and time.  Psychiatric:        Mood and Affect: Mood normal.        Behavior: Behavior normal.        Assessment/Plan: 1. Erythrocytosis (Primary) Referred to hematology for further evaluation - Ambulatory referral to Hematology / Oncology  2. Acquired hypothyroidism Thyroid  labs ordered to repeat. Patient will increase dose back to 1 tab daily and 1/2 tab twice weekly.  - TSH + free T4  3. Stage 3a chronic kidney disease (CKD) (HCC) eGFR is stable compared to prior labs.   4. Hypercalcemia Resolved on repeat labs.    General Counseling: zaray gatchel understanding of the findings of todays visit and agrees with plan of treatment. I have discussed any further diagnostic evaluation that may be needed or ordered today. We also reviewed her medications today. she has been encouraged to call the office with any questions or concerns that should arise related to todays visit.    Orders Placed  This Encounter  Procedures   TSH + free T4   Ambulatory referral to Hematology / Oncology    No orders of the defined types were placed in this encounter.   Return in about 5 months (around 12/17/2024) for F/U, Darris Carachure PCP.   Total time spent:30 Minutes Time spent includes review of chart, medications, test results, and follow up plan with the patient.    Controlled Substance Database was reviewed by me.  This patient was seen by Mardy Maxin, FNP-C in collaboration with Dr. Sigrid Bathe as a part of collaborative care agreement.   Willis Holquin R. Maxin, MSN, FNP-C Internal medicine

## 2024-07-20 ENCOUNTER — Encounter: Payer: Self-pay | Admitting: Oncology

## 2024-07-22 ENCOUNTER — Other Ambulatory Visit: Payer: Self-pay | Admitting: Nurse Practitioner

## 2024-07-22 ENCOUNTER — Other Ambulatory Visit: Payer: Self-pay | Admitting: Physician Assistant

## 2024-07-25 ENCOUNTER — Other Ambulatory Visit: Payer: Self-pay

## 2024-07-25 ENCOUNTER — Other Ambulatory Visit (HOSPITAL_COMMUNITY): Payer: Self-pay

## 2024-07-25 ENCOUNTER — Encounter: Payer: Self-pay | Admitting: Oncology

## 2024-07-25 ENCOUNTER — Inpatient Hospital Stay

## 2024-07-25 ENCOUNTER — Inpatient Hospital Stay: Attending: Oncology | Admitting: Oncology

## 2024-07-25 VITALS — BP 132/65 | HR 77 | Temp 98.0°F | Resp 16 | Wt 165.5 lb

## 2024-07-25 DIAGNOSIS — Z803 Family history of malignant neoplasm of breast: Secondary | ICD-10-CM | POA: Insufficient documentation

## 2024-07-25 DIAGNOSIS — D751 Secondary polycythemia: Secondary | ICD-10-CM

## 2024-07-25 DIAGNOSIS — Z808 Family history of malignant neoplasm of other organs or systems: Secondary | ICD-10-CM | POA: Diagnosis not present

## 2024-07-25 DIAGNOSIS — Z800 Family history of malignant neoplasm of digestive organs: Secondary | ICD-10-CM | POA: Diagnosis not present

## 2024-07-25 DIAGNOSIS — Z87891 Personal history of nicotine dependence: Secondary | ICD-10-CM | POA: Insufficient documentation

## 2024-07-25 DIAGNOSIS — Z8042 Family history of malignant neoplasm of prostate: Secondary | ICD-10-CM | POA: Insufficient documentation

## 2024-07-25 LAB — URINALYSIS, COMPLETE (UACMP) WITH MICROSCOPIC
Bilirubin Urine: NEGATIVE
Glucose, UA: NEGATIVE mg/dL
Ketones, ur: NEGATIVE mg/dL
Nitrite: NEGATIVE
Protein, ur: NEGATIVE mg/dL
Specific Gravity, Urine: 1.009 (ref 1.005–1.030)
pH: 7 (ref 5.0–8.0)

## 2024-07-25 LAB — CBC WITH DIFFERENTIAL (CANCER CENTER ONLY)
Abs Immature Granulocytes: 0.03 K/uL (ref 0.00–0.07)
Basophils Absolute: 0.1 K/uL (ref 0.0–0.1)
Basophils Relative: 1 %
Eosinophils Absolute: 0.1 K/uL (ref 0.0–0.5)
Eosinophils Relative: 2 %
HCT: 44.8 % (ref 36.0–46.0)
Hemoglobin: 15.4 g/dL — ABNORMAL HIGH (ref 12.0–15.0)
Immature Granulocytes: 0 %
Lymphocytes Relative: 24 %
Lymphs Abs: 1.7 K/uL (ref 0.7–4.0)
MCH: 30.4 pg (ref 26.0–34.0)
MCHC: 34.4 g/dL (ref 30.0–36.0)
MCV: 88.4 fL (ref 80.0–100.0)
Monocytes Absolute: 0.6 K/uL (ref 0.1–1.0)
Monocytes Relative: 9 %
Neutro Abs: 4.4 K/uL (ref 1.7–7.7)
Neutrophils Relative %: 64 %
Platelet Count: 237 K/uL (ref 150–400)
RBC: 5.07 MIL/uL (ref 3.87–5.11)
RDW: 12.3 % (ref 11.5–15.5)
WBC Count: 6.9 K/uL (ref 4.0–10.5)
nRBC: 0 % (ref 0.0–0.2)

## 2024-07-25 MED FILL — Evolocumab Subcutaneous Soln Auto-Injector 140 MG/ML: SUBCUTANEOUS | 28 days supply | Qty: 2 | Fill #2 | Status: AC

## 2024-07-25 NOTE — Progress Notes (Unsigned)
 New patient referred by Dr Liana for Erythrocytosis.

## 2024-07-25 NOTE — Progress Notes (Unsigned)
 "  Hematology/Oncology Consult note [facility] [phone] [phone]  Patient Care Team: Liana Fish, NP as PCP - General (Nurse Practitioner) Darron Deatrice LABOR, MD as PCP - Cardiology (Cardiology) Vincenzo Slough, [facility] (Inactive) as Pharmacist (Pharmacist) Melanee Annah BROCKS, MD as Consulting Physician (Oncology)   Name of the patient: Toni [name]  [id number]  Aug 29, 1945    Reason for referral-polycythemia   Referring physician-[name] Liana, NP  Date of visit: 07/25/24   History of presenting illness- Patient is a 79 year old female with a past medical history significant for hypertension hyperlipidemia hypothyroidism type 2 diabetes, osteoporosis and sleep apnea not currently using CPAP.  She has been referred for polycythemia.  CBC on 07/13/2024 showed white count of 6.4, H&H of 16.2/47.5 and a platelet count of 261.  Looking back at her prior CBCs her hemoglobin was between 14-15 up until September 2024 but since then has been between 16-17 without a clear rising trend.  Ferritin levels were normal at 51 with an iron saturation of 19%.  She is asymptomatic with respect to polycythemia, denying symptoms such as headache, visual changes, pruritus, or thrombotic events. She has no history of smoking, chronic pulmonary disease, or exogenous testosterone use. She has mild obstructive sleep apnea and does not use CPAP therapy.  She describes persistent fatigue and has longstanding hypothyroidism following thyroidectomy for benign thyroid  polyps after radioactive iodine treatment 28 years ago. She is maintained on a stable dose of thyroid  hormone replacement but reports recent fluctuations in thyroid  levels and ongoing fatigue. She questions whether her fatigue may be related to her elevated hemoglobin or thyroid  status. Iron studies are within normal limits.      ECOG PS- 1  Pain scale- 0   Review of systems- Review of Systems   Constitutional:  Negative for chills, fever, malaise/fatigue and weight loss.  HENT:  Negative for congestion, ear discharge and nosebleeds.   Eyes:  Negative for blurred vision.  Respiratory:  Negative for cough, hemoptysis, sputum production, shortness of breath and wheezing.   Cardiovascular:  Negative for chest pain, palpitations, orthopnea and claudication.  Gastrointestinal:  Negative for abdominal pain, blood in stool, constipation, diarrhea, heartburn, melena, nausea and vomiting.  Genitourinary:  Negative for dysuria, flank pain, frequency, hematuria and urgency.  Musculoskeletal:  Negative for back pain, joint pain and myalgias.  Skin:  Negative for rash.  Neurological:  Negative for dizziness, tingling, focal weakness, seizures, weakness and headaches.  Endo/Heme/Allergies:  Does not bruise/bleed easily.  Psychiatric/Behavioral:  Negative for depression and suicidal ideas. The patient does not have insomnia.     Allergies[1]  Patient Active Problem List   Diagnosis Date Noted   Diet-controlled type 2 diabetes mellitus (HCC) 05/26/2024   Stage 3a chronic kidney disease (CKD) (HCC) 05/26/2024   Vitamin D  deficiency 05/26/2024   B12 deficiency 05/26/2024   Needs flu shot 06/02/2020   Need for vaccination against Streptococcus pneumoniae using pneumococcal conjugate vaccine 13 06/02/2020   Pre-diabetes 11/25/2019   Lumbar back pain with radiculopathy affecting lower extremity 11/25/2019   Left sided sciatica 05/24/2019   Encounter for general adult medical examination with abnormal findings 05/02/2018   Chronic cough 05/02/2018   Hypothyroidism 01/14/2018   Urinary tract infection with hematuria 01/14/2018   Dysuria 01/14/2018   Acute recurrent pansinusitis 01/14/2018   Atypical chest pain 12/13/2014   Aortic atherosclerosis    Mixed hyperlipidemia    Essential hypertension    Diverticulitis of colon without hemorrhage 11/13/2014  Past Medical History:  Diagnosis  Date   Allergic rhinitis    Allergy    Aortic atherosclerosis    Diabetes mellitus, type 2 (HCC)    no medicines- was pre diabtes- diet controlled    Diverticulitis    Essential hypertension    GERD (gastroesophageal reflux disease)    Hyperlipidemia    Hypertension    Hypothyroidism    Obesity    Osteoporosis    Sleep apnea    mild -no cpap   Transient insomnia    Tubular adenoma of colon 05/2013     Past Surgical History:  Procedure Laterality Date   APPENDECTOMY     BUNIONECTOMY     right foot   CARDIAC CATHETERIZATION  1994   [facility]    CATARACT EXTRACTION, BILATERAL Left 10/20/2017   CATARACT EXTRACTION, BILATERAL Right 10/27/2017   CHOLECYSTECTOMY     COLONOSCOPY  05/25/2016   Dr.Stark   COLONOSCOPY     March 2023   HAMMER TOE SURGERY  12/2012   left toe   PARTIAL HYSTERECTOMY     POLYPECTOMY     ROBOTIC ASSISTED SALPINGO OOPHERECTOMY     right ovary first removed then left ovary removed years later   TONSILLECTOMY  age 36   TOTAL THYROIDECTOMY  09/2011    Social History   Socioeconomic History   Marital status: Married    Spouse name: Not on file   Number of children: 2   Years of education: Not on file   Highest education level: Not on file  Occupational History   Occupation: Retired  Tobacco Use   Smoking status: Former    Current packs/day: 0.00    Average packs/day: 1 pack/day for 30.0 years (30.0 ttl pk-yrs)    Types: Cigarettes    Start date: 07/06/1968    Quit date: 07/06/1998    Years since quitting: 26.0   Smokeless tobacco: Never  Vaping Use   Vaping status: Never Used  Substance and Sexual Activity   Alcohol use: No    Alcohol/week: 0.0 standard drinks of alcohol   Drug use: No   Sexual activity: Not on file  Other Topics Concern   Not on file  Social History Narrative   Not on file   Social Drivers of Health   Tobacco Use: Medium Risk (07/19/2024)   Patient History    Smoking Tobacco Use: Former    Smokeless Tobacco  Use: Never    Passive Exposure: Not on Actuary Strain: Not on file  Food Insecurity: Not on file  Transportation Needs: Not on file  Physical Activity: Not on file  Stress: Not on file  Social Connections: Not on file  Intimate Partner Violence: Not on file  Depression (PHQ2-9): Low Risk (05/26/2024)   Depression (PHQ2-9)    PHQ-2 Score: 0  Alcohol Screen: Low Risk (12/08/2021)   Alcohol Screen    Last Alcohol Screening Score (AUDIT): 0  Housing: Unknown (01/25/2024)   Received from [facility]   Epic    Unable to Pay for Housing in the Last Year: Not on file    Number of Times Moved in the Last Year: Not on file    At any time in the past 12 months, were you homeless or living in a shelter (including now)?: No  Utilities: Not on file  Health Literacy: Not on file     Family History  Problem Relation Age of Onset   Throat cancer Father  Esophageal cancer Father    Colon cancer Maternal Aunt    Colon polyps Maternal Aunt    Colon cancer Maternal Uncle    Colon polyps Maternal Uncle    Heart disease Maternal Uncle    Stomach cancer Maternal Uncle    Prostate cancer Maternal Uncle    Breast cancer Cousin    Diabetes Neg Hx    Kidney disease Neg Hx    Gallbladder disease Neg Hx    Rectal cancer Neg Hx     Current Medications[2]   Physical exam: There were no vitals filed for this visit. Physical Exam Cardiovascular:     Rate and Rhythm: Normal rate and regular rhythm.     Heart sounds: Normal heart sounds.  Pulmonary:     Effort: Pulmonary effort is normal.     Breath sounds: Normal breath sounds.  Abdominal:     General: Bowel sounds are normal.     Palpations: Abdomen is soft.     Comments: No palpable hepatosplenomegaly  Skin:    General: Skin is warm and dry.  Neurological:     Mental Status: She is alert and oriented to person, place, and time.           Latest Ref Rng & Units 07/13/2024    7:30 AM  CMP   Creatinine 0.57 - 1.00 mg/dL 8.93   Calcium 8.7 - 89.6 mg/dL 9.8       Latest Ref Rng & Units 07/13/2024    7:30 AM  CBC  WBC 3.4 - 10.8 x10E3/uL 6.4   Hemoglobin 11.1 - 15.9 g/dL 83.7   Hematocrit 65.9 - 46.6 % 47.5   Platelets 150 - 450 x10E3/uL 261       Assessment and plan- Patient is a 79 y.o. female referred for polycythemia  Assessment and Plan    Polycythemia  - Hemoglobin has been greater than 16 on 2 separate occasions meeting laboratory criteria for polycythemia. Differential includes polycythemia vera and secondary polycythemia.  Discussed differences between the 2.  Discussed potential risk of thromboembolism and cardiovascular events associated with polycythemia vera and the need to reduce hematocrit to less than 45 only if she has polycythemia vera.  Secondary polycythemia and does not carry the same risk of thromboembolic events and hematocrit targets are usually higher less than 55 for phlebotomy. - Ordered molecular testing for JAK2, CALR, MPL, and exon 12 mutations. - Provided education on primary vs. secondary polycythemia and cardiovascular risks. - Scheduled follow-up in three weeks to review results and determine etiology and discuss treatment options accordingly     Thank you for this kind referral and the opportunity to participate in the care of this patient   Visit Diagnosis 1. Polycythemia     Dr. Annah Skene, MD, MPH [facility] [phone] 07/25/2024                   [1]  Allergies Allergen Reactions   Cefaclor Other (See Comments)    REACTION: swelling/hives   Penicillins     REACTION: swelling/hives   Talwin [Pentazocine]   [2]  Current Outpatient Medications:    aspirin 81 MG tablet, Take 81 mg by mouth daily., Disp: , Rfl:    Calcium Carbonate-Vitamin D  (CALCIUM + D PO), Take 1 tablet by mouth daily.  (Patient not taking: Reported on 05/26/2024), Disp: , Rfl:    cetirizine (ZYRTEC) 10 MG  tablet, Take 10 mg by mouth as needed. , Disp: ,  Rfl:    chlorthalidone  (HYGROTON ) 25 MG tablet, TAKE ONE TABLET BY MOUTH ONCE A DAY, Disp: 90 tablet, Rfl: 3   ezetimibe  (ZETIA ) 10 MG tablet, Take 1 tablet (10 mg total) by mouth daily., Disp: 90 tablet, Rfl: 3   icosapent  Ethyl (VASCEPA ) 1 g capsule, TAKE TWO CAPSULES (2 GRAM TOTAL) BY MOUTH TWO TIMES DAILY., Disp: 360 capsule, Rfl: 1   losartan  (COZAAR ) 25 MG tablet, Take 1 tablet (25 mg total) by mouth daily., Disp: 90 tablet, Rfl: 3   pantoprazole  (PROTONIX ) 40 MG tablet, TAKE ONE TABLET BY MOUTH ONCE A DAY, Disp: 90 tablet, Rfl: 3   Evolocumab  (REPATHA  SURECLICK) 140 MG/ML SOAJ, Inject 140 mg into the skin every 14 (fourteen) days., Disp: 2 mL, Rfl: 11   spironolactone  (ALDACTONE ) 25 MG tablet, TAKE ONE TABLET BY MOUTH TWO TIMES DAILY, Disp: 180 tablet, Rfl: 3   SYNTHROID  112 MCG tablet, TAKE 1 TABLET (112 MCG TOTAL) BY MOUTH EVERY MORNING., Disp: 90 tablet, Rfl: 1  "

## 2024-07-26 ENCOUNTER — Encounter: Payer: Self-pay | Admitting: Oncology

## 2024-07-26 LAB — ERYTHROPOIETIN: Erythropoietin: 7.7 m[IU]/mL (ref 2.6–18.5)

## 2024-08-02 LAB — CALR +MPL + E12-E15  (REFLEX)

## 2024-08-02 LAB — JAK2 V617F RFX CALR/MPL/E12-15

## 2024-08-07 ENCOUNTER — Ambulatory Visit: Admitting: Pharmacist

## 2024-08-15 ENCOUNTER — Inpatient Hospital Stay: Admitting: Oncology

## 2024-09-19 ENCOUNTER — Ambulatory Visit: Admitting: Pharmacist

## 2024-12-20 ENCOUNTER — Ambulatory Visit: Admitting: Nurse Practitioner

## 2025-05-29 ENCOUNTER — Ambulatory Visit: Admitting: Nurse Practitioner
